# Patient Record
Sex: Female | Born: 1935 | Race: Black or African American | Hispanic: No | Smoking: Never smoker
Health system: Southern US, Community
[De-identification: ages and names within clinical notes are randomized; demographics above are authoritative.]

## PROBLEM LIST (undated history)

## (undated) DIAGNOSIS — M1990 Unspecified osteoarthritis, unspecified site: Secondary | ICD-10-CM

## (undated) DIAGNOSIS — M2540 Effusion, unspecified joint: Secondary | ICD-10-CM

## (undated) DIAGNOSIS — I639 Cerebral infarction, unspecified: Secondary | ICD-10-CM

## (undated) DIAGNOSIS — H409 Unspecified glaucoma: Secondary | ICD-10-CM

## (undated) DIAGNOSIS — K219 Gastro-esophageal reflux disease without esophagitis: Secondary | ICD-10-CM

## (undated) DIAGNOSIS — I2510 Atherosclerotic heart disease of native coronary artery without angina pectoris: Secondary | ICD-10-CM

## (undated) DIAGNOSIS — F419 Anxiety disorder, unspecified: Secondary | ICD-10-CM

## (undated) DIAGNOSIS — E119 Type 2 diabetes mellitus without complications: Secondary | ICD-10-CM

## (undated) DIAGNOSIS — I480 Paroxysmal atrial fibrillation: Secondary | ICD-10-CM

## (undated) DIAGNOSIS — M549 Dorsalgia, unspecified: Secondary | ICD-10-CM

## (undated) DIAGNOSIS — G459 Transient cerebral ischemic attack, unspecified: Secondary | ICD-10-CM

## (undated) DIAGNOSIS — M2550 Pain in unspecified joint: Secondary | ICD-10-CM

## (undated) DIAGNOSIS — R42 Dizziness and giddiness: Secondary | ICD-10-CM

## (undated) DIAGNOSIS — R011 Cardiac murmur, unspecified: Secondary | ICD-10-CM

## (undated) DIAGNOSIS — Z8719 Personal history of other diseases of the digestive system: Secondary | ICD-10-CM

## (undated) DIAGNOSIS — I872 Venous insufficiency (chronic) (peripheral): Secondary | ICD-10-CM

## (undated) DIAGNOSIS — I10 Essential (primary) hypertension: Secondary | ICD-10-CM

## (undated) DIAGNOSIS — Z8711 Personal history of peptic ulcer disease: Secondary | ICD-10-CM

## (undated) DIAGNOSIS — J42 Unspecified chronic bronchitis: Secondary | ICD-10-CM

## (undated) DIAGNOSIS — Z950 Presence of cardiac pacemaker: Secondary | ICD-10-CM

## (undated) DIAGNOSIS — Z8739 Personal history of other diseases of the musculoskeletal system and connective tissue: Secondary | ICD-10-CM

## (undated) DIAGNOSIS — F329 Major depressive disorder, single episode, unspecified: Secondary | ICD-10-CM

## (undated) DIAGNOSIS — I5032 Chronic diastolic (congestive) heart failure: Secondary | ICD-10-CM

## (undated) DIAGNOSIS — J189 Pneumonia, unspecified organism: Secondary | ICD-10-CM

## (undated) DIAGNOSIS — L439 Lichen planus, unspecified: Secondary | ICD-10-CM

## (undated) DIAGNOSIS — A159 Respiratory tuberculosis unspecified: Secondary | ICD-10-CM

## (undated) DIAGNOSIS — K5790 Diverticulosis of intestine, part unspecified, without perforation or abscess without bleeding: Secondary | ICD-10-CM

## (undated) DIAGNOSIS — I495 Sick sinus syndrome: Secondary | ICD-10-CM

## (undated) DIAGNOSIS — F32A Depression, unspecified: Secondary | ICD-10-CM

## (undated) DIAGNOSIS — M62838 Other muscle spasm: Secondary | ICD-10-CM

## (undated) DIAGNOSIS — G8929 Other chronic pain: Secondary | ICD-10-CM

## (undated) DIAGNOSIS — Z860100 Personal history of colon polyps, unspecified: Secondary | ICD-10-CM

## (undated) HISTORY — DX: Lichen planus, unspecified: L43.9

## (undated) HISTORY — DX: Unspecified chronic bronchitis: J42

## (undated) HISTORY — DX: Atherosclerotic heart disease of native coronary artery without angina pectoris: I25.10

## (undated) HISTORY — DX: Paroxysmal atrial fibrillation: I48.0

## (undated) HISTORY — DX: Venous insufficiency (chronic) (peripheral): I87.2

## (undated) HISTORY — PX: ABDOMINAL HYSTERECTOMY: SHX81

## (undated) HISTORY — PX: INSERT / REPLACE / REMOVE PACEMAKER: SUR710

## (undated) HISTORY — DX: Unspecified osteoarthritis, unspecified site: M19.90

## (undated) HISTORY — PX: CARDIAC CATHETERIZATION: SHX172

## (undated) HISTORY — PX: TONSILLECTOMY: SUR1361

## (undated) HISTORY — PX: ESOPHAGOGASTRODUODENOSCOPY: SHX1529

## (undated) HISTORY — PX: COLONOSCOPY: SHX174

## (undated) HISTORY — DX: Chronic diastolic (congestive) heart failure: I50.32

## (undated) HISTORY — DX: Sick sinus syndrome: I49.5

## (undated) HISTORY — PX: BACK SURGERY: SHX140

## (undated) HISTORY — PX: CATARACT EXTRACTION W/ INTRAOCULAR LENS  IMPLANT, BILATERAL: SHX1307

---

## 1969-01-28 HISTORY — PX: TUBAL LIGATION: SHX77

## 1988-09-28 DIAGNOSIS — I639 Cerebral infarction, unspecified: Secondary | ICD-10-CM

## 1988-09-28 HISTORY — DX: Cerebral infarction, unspecified: I63.9

## 1997-03-10 ENCOUNTER — Ambulatory Visit (HOSPITAL_COMMUNITY): Admission: RE | Admit: 1997-03-10 | Discharge: 1997-03-10 | Payer: Self-pay | Admitting: *Deleted

## 1997-04-28 ENCOUNTER — Ambulatory Visit (HOSPITAL_COMMUNITY): Admission: RE | Admit: 1997-04-28 | Discharge: 1997-04-28 | Payer: Self-pay | Admitting: *Deleted

## 1997-07-12 ENCOUNTER — Ambulatory Visit (HOSPITAL_COMMUNITY): Admission: RE | Admit: 1997-07-12 | Discharge: 1997-07-12 | Payer: Self-pay | Admitting: *Deleted

## 1997-07-18 ENCOUNTER — Emergency Department (HOSPITAL_COMMUNITY): Admission: EM | Admit: 1997-07-18 | Discharge: 1997-07-18 | Payer: Self-pay | Admitting: Emergency Medicine

## 1997-09-26 ENCOUNTER — Emergency Department (HOSPITAL_COMMUNITY): Admission: EM | Admit: 1997-09-26 | Discharge: 1997-09-26 | Payer: Self-pay | Admitting: Emergency Medicine

## 1997-10-13 ENCOUNTER — Emergency Department (HOSPITAL_COMMUNITY): Admission: EM | Admit: 1997-10-13 | Discharge: 1997-10-13 | Payer: Self-pay

## 1998-01-11 ENCOUNTER — Ambulatory Visit (HOSPITAL_COMMUNITY): Admission: RE | Admit: 1998-01-11 | Discharge: 1998-01-11 | Payer: Self-pay | Admitting: *Deleted

## 1998-03-03 ENCOUNTER — Ambulatory Visit (HOSPITAL_COMMUNITY): Admission: RE | Admit: 1998-03-03 | Discharge: 1998-03-03 | Payer: Self-pay | Admitting: *Deleted

## 1998-03-23 ENCOUNTER — Ambulatory Visit (HOSPITAL_COMMUNITY): Admission: RE | Admit: 1998-03-23 | Discharge: 1998-03-23 | Payer: Self-pay | Admitting: *Deleted

## 1998-04-07 ENCOUNTER — Ambulatory Visit (HOSPITAL_COMMUNITY): Admission: RE | Admit: 1998-04-07 | Discharge: 1998-04-07 | Payer: Self-pay | Admitting: *Deleted

## 1998-04-26 ENCOUNTER — Other Ambulatory Visit: Admission: RE | Admit: 1998-04-26 | Discharge: 1998-04-26 | Payer: Self-pay | Admitting: Obstetrics and Gynecology

## 1998-05-11 ENCOUNTER — Encounter: Payer: Self-pay | Admitting: Obstetrics and Gynecology

## 1998-05-11 ENCOUNTER — Ambulatory Visit (HOSPITAL_COMMUNITY): Admission: RE | Admit: 1998-05-11 | Discharge: 1998-05-11 | Payer: Self-pay | Admitting: Obstetrics and Gynecology

## 1998-11-09 ENCOUNTER — Encounter: Admission: RE | Admit: 1998-11-09 | Discharge: 1998-11-09 | Payer: Self-pay | Admitting: Nephrology

## 1998-11-16 ENCOUNTER — Encounter: Payer: Self-pay | Admitting: Nephrology

## 1998-11-16 ENCOUNTER — Encounter: Admission: RE | Admit: 1998-11-16 | Discharge: 1998-11-16 | Payer: Self-pay | Admitting: Nephrology

## 1999-02-23 ENCOUNTER — Encounter: Payer: Self-pay | Admitting: Family Medicine

## 1999-02-23 ENCOUNTER — Ambulatory Visit (HOSPITAL_COMMUNITY): Admission: RE | Admit: 1999-02-23 | Discharge: 1999-02-23 | Payer: Self-pay | Admitting: Family Medicine

## 1999-03-16 ENCOUNTER — Encounter: Payer: Self-pay | Admitting: Family Medicine

## 1999-03-16 ENCOUNTER — Ambulatory Visit (HOSPITAL_COMMUNITY): Admission: RE | Admit: 1999-03-16 | Discharge: 1999-03-16 | Payer: Self-pay | Admitting: Family Medicine

## 1999-03-30 ENCOUNTER — Encounter: Payer: Self-pay | Admitting: Emergency Medicine

## 1999-03-30 ENCOUNTER — Inpatient Hospital Stay (HOSPITAL_COMMUNITY): Admission: EM | Admit: 1999-03-30 | Discharge: 1999-04-04 | Payer: Self-pay | Admitting: Interventional Cardiology

## 1999-04-02 ENCOUNTER — Encounter: Payer: Self-pay | Admitting: Interventional Cardiology

## 1999-04-04 ENCOUNTER — Encounter: Payer: Self-pay | Admitting: Interventional Cardiology

## 1999-05-17 ENCOUNTER — Encounter: Admission: RE | Admit: 1999-05-17 | Discharge: 1999-05-17 | Payer: Self-pay | Admitting: *Deleted

## 1999-10-03 ENCOUNTER — Ambulatory Visit (HOSPITAL_COMMUNITY): Admission: RE | Admit: 1999-10-03 | Discharge: 1999-10-03 | Payer: Self-pay | Admitting: Family Medicine

## 1999-10-03 ENCOUNTER — Encounter: Payer: Self-pay | Admitting: Family Medicine

## 1999-11-13 ENCOUNTER — Other Ambulatory Visit: Admission: RE | Admit: 1999-11-13 | Discharge: 1999-11-13 | Payer: Self-pay | Admitting: Obstetrics and Gynecology

## 1999-11-15 ENCOUNTER — Encounter: Payer: Self-pay | Admitting: Obstetrics and Gynecology

## 1999-11-15 ENCOUNTER — Ambulatory Visit (HOSPITAL_COMMUNITY): Admission: RE | Admit: 1999-11-15 | Discharge: 1999-11-15 | Payer: Self-pay | Admitting: Family Medicine

## 2000-05-02 ENCOUNTER — Encounter: Payer: Self-pay | Admitting: Family Medicine

## 2000-05-02 ENCOUNTER — Ambulatory Visit (HOSPITAL_COMMUNITY): Admission: RE | Admit: 2000-05-02 | Discharge: 2000-05-02 | Payer: Self-pay | Admitting: Family Medicine

## 2000-05-15 ENCOUNTER — Encounter: Payer: Self-pay | Admitting: Family Medicine

## 2000-05-15 ENCOUNTER — Ambulatory Visit (HOSPITAL_COMMUNITY): Admission: RE | Admit: 2000-05-15 | Discharge: 2000-05-15 | Payer: Self-pay | Admitting: Family Medicine

## 2000-08-27 ENCOUNTER — Encounter: Payer: Self-pay | Admitting: Family Medicine

## 2000-08-27 ENCOUNTER — Ambulatory Visit (HOSPITAL_COMMUNITY): Admission: RE | Admit: 2000-08-27 | Discharge: 2000-08-27 | Payer: Self-pay | Admitting: Family Medicine

## 2001-10-02 ENCOUNTER — Ambulatory Visit (HOSPITAL_COMMUNITY): Admission: RE | Admit: 2001-10-02 | Discharge: 2001-10-02 | Payer: Self-pay | Admitting: *Deleted

## 2001-10-02 ENCOUNTER — Encounter (INDEPENDENT_AMBULATORY_CARE_PROVIDER_SITE_OTHER): Payer: Self-pay | Admitting: Specialist

## 2002-01-19 ENCOUNTER — Encounter: Payer: Self-pay | Admitting: Emergency Medicine

## 2002-01-19 ENCOUNTER — Emergency Department (HOSPITAL_COMMUNITY): Admission: EM | Admit: 2002-01-19 | Discharge: 2002-01-19 | Payer: Self-pay | Admitting: Emergency Medicine

## 2002-03-22 ENCOUNTER — Ambulatory Visit (HOSPITAL_COMMUNITY): Admission: RE | Admit: 2002-03-22 | Discharge: 2002-03-22 | Payer: Self-pay | Admitting: Family Medicine

## 2002-03-22 ENCOUNTER — Encounter: Payer: Self-pay | Admitting: Family Medicine

## 2002-04-08 ENCOUNTER — Other Ambulatory Visit: Admission: RE | Admit: 2002-04-08 | Discharge: 2002-04-08 | Payer: Self-pay | Admitting: Obstetrics and Gynecology

## 2002-04-14 ENCOUNTER — Encounter: Payer: Self-pay | Admitting: Obstetrics and Gynecology

## 2002-04-14 ENCOUNTER — Ambulatory Visit (HOSPITAL_COMMUNITY): Admission: RE | Admit: 2002-04-14 | Discharge: 2002-04-14 | Payer: Self-pay | Admitting: Obstetrics and Gynecology

## 2002-11-09 ENCOUNTER — Encounter: Payer: Self-pay | Admitting: Family Medicine

## 2002-11-09 ENCOUNTER — Ambulatory Visit (HOSPITAL_COMMUNITY): Admission: RE | Admit: 2002-11-09 | Discharge: 2002-11-09 | Payer: Self-pay | Admitting: Family Medicine

## 2003-06-01 ENCOUNTER — Ambulatory Visit (HOSPITAL_COMMUNITY): Admission: RE | Admit: 2003-06-01 | Discharge: 2003-06-01 | Payer: Self-pay | Admitting: Specialist

## 2003-06-17 ENCOUNTER — Ambulatory Visit (HOSPITAL_COMMUNITY): Admission: RE | Admit: 2003-06-17 | Discharge: 2003-06-17 | Payer: Self-pay | Admitting: Family Medicine

## 2003-06-23 ENCOUNTER — Inpatient Hospital Stay (HOSPITAL_BASED_OUTPATIENT_CLINIC_OR_DEPARTMENT_OTHER): Admission: RE | Admit: 2003-06-23 | Discharge: 2003-06-23 | Payer: Self-pay | Admitting: Interventional Cardiology

## 2003-09-13 ENCOUNTER — Ambulatory Visit (HOSPITAL_COMMUNITY): Admission: RE | Admit: 2003-09-13 | Discharge: 2003-09-13 | Payer: Self-pay | Admitting: Family Medicine

## 2003-09-14 ENCOUNTER — Ambulatory Visit (HOSPITAL_COMMUNITY): Admission: RE | Admit: 2003-09-14 | Discharge: 2003-09-14 | Payer: Self-pay | Admitting: Specialist

## 2006-05-21 ENCOUNTER — Ambulatory Visit (HOSPITAL_COMMUNITY): Admission: RE | Admit: 2006-05-21 | Discharge: 2006-05-21 | Payer: Self-pay | Admitting: Obstetrics and Gynecology

## 2006-07-23 ENCOUNTER — Ambulatory Visit (HOSPITAL_COMMUNITY): Admission: RE | Admit: 2006-07-23 | Discharge: 2006-07-23 | Payer: Self-pay | Admitting: Cardiology

## 2006-08-29 HISTORY — PX: LUMBAR DISC SURGERY: SHX700

## 2006-10-01 ENCOUNTER — Emergency Department (HOSPITAL_COMMUNITY): Admission: EM | Admit: 2006-10-01 | Discharge: 2006-10-01 | Payer: Self-pay | Admitting: Emergency Medicine

## 2006-10-01 ENCOUNTER — Ambulatory Visit: Payer: Self-pay | Admitting: Surgery

## 2007-06-11 ENCOUNTER — Encounter: Admission: RE | Admit: 2007-06-11 | Discharge: 2007-06-11 | Payer: Self-pay | Admitting: Orthopaedic Surgery

## 2007-07-02 ENCOUNTER — Encounter: Admission: RE | Admit: 2007-07-02 | Discharge: 2007-07-02 | Payer: Self-pay | Admitting: Orthopaedic Surgery

## 2007-08-21 ENCOUNTER — Ambulatory Visit (HOSPITAL_COMMUNITY): Admission: RE | Admit: 2007-08-21 | Discharge: 2007-08-21 | Payer: Self-pay | Admitting: Family Medicine

## 2007-09-03 ENCOUNTER — Ambulatory Visit: Payer: Self-pay | Admitting: Vascular Surgery

## 2007-09-03 ENCOUNTER — Encounter (INDEPENDENT_AMBULATORY_CARE_PROVIDER_SITE_OTHER): Payer: Self-pay | Admitting: Family Medicine

## 2007-09-03 ENCOUNTER — Ambulatory Visit: Admission: RE | Admit: 2007-09-03 | Discharge: 2007-09-03 | Payer: Self-pay | Admitting: Family Medicine

## 2008-07-08 ENCOUNTER — Encounter: Admission: RE | Admit: 2008-07-08 | Discharge: 2008-07-08 | Payer: Self-pay | Admitting: Family Medicine

## 2008-11-18 ENCOUNTER — Emergency Department (HOSPITAL_COMMUNITY): Admission: EM | Admit: 2008-11-18 | Discharge: 2008-11-18 | Payer: Self-pay | Admitting: Emergency Medicine

## 2010-04-10 ENCOUNTER — Other Ambulatory Visit (HOSPITAL_COMMUNITY): Payer: Self-pay | Admitting: Family Medicine

## 2010-04-10 DIAGNOSIS — Z1231 Encounter for screening mammogram for malignant neoplasm of breast: Secondary | ICD-10-CM

## 2010-04-12 ENCOUNTER — Ambulatory Visit (HOSPITAL_COMMUNITY): Payer: Medicare Other

## 2010-04-19 ENCOUNTER — Ambulatory Visit (HOSPITAL_COMMUNITY)
Admission: RE | Admit: 2010-04-19 | Discharge: 2010-04-19 | Disposition: A | Discharge status: Home / Self Care | Payer: Medicare Other | Source: Ambulatory Visit | Attending: Family Medicine | Admitting: Family Medicine

## 2010-04-19 DIAGNOSIS — Z1231 Encounter for screening mammogram for malignant neoplasm of breast: Secondary | ICD-10-CM | POA: Insufficient documentation

## 2010-05-03 LAB — BASIC METABOLIC PANEL
BUN: 12 mg/dL (ref 6–23)
Chloride: 97 mEq/L (ref 96–112)
Glucose, Bld: 125 mg/dL — ABNORMAL HIGH (ref 70–99)
Potassium: 3.1 mEq/L — ABNORMAL LOW (ref 3.5–5.1)

## 2010-05-03 LAB — DIFFERENTIAL
Basophils Absolute: 0 10*3/uL (ref 0.0–0.1)
Basophils Relative: 0 % (ref 0–1)
Eosinophils Relative: 2 % (ref 0–5)
Lymphocytes Relative: 25 % (ref 12–46)
Monocytes Absolute: 0.7 10*3/uL (ref 0.1–1.0)
Monocytes Relative: 10 % (ref 3–12)

## 2010-05-03 LAB — CBC
HCT: 40.3 % (ref 36.0–46.0)
Hemoglobin: 13.2 g/dL (ref 12.0–15.0)
MCHC: 32.8 g/dL (ref 30.0–36.0)
RDW: 14 % (ref 11.5–15.5)

## 2010-06-12 NOTE — Op Note (Signed)
NAME:  [name], [name] NO.:  [id number]   MEDICAL RECORD NO.:  [id number]          PATIENT TYPE:  OIB   LOCATION:  2853                         FACILITY:  [facility]   PHYSICIAN:  Francisca December, M.D.  DATE OF BIRTH:  January 19, 1933   DATE OF PROCEDURE:  07/23/2006  DATE OF DISCHARGE:                               OPERATIVE REPORT   PROCEDURE:  Permanent pacemaker battery change.   PROCEDURES PERFORMED:  1. Explant old pacing generator.  2. Implant new dual-chamber permanent transvenous pacemaker.   INDICATION:  Elective replacement interval on a Medtronic KDR 601  implant April 03, 1999. 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   PROCEDURAL NOTE:  The patient was brought to the cardiac catheterization  laboratory in the fasting state.  The left prepectoral region was  prepped and draped in the usual sterile fashion.  Local anesthesia was  obtained with infiltration of 1% lidocaine with epinephrine throughout  the old pacemaker insertion site.  There was a moderate size keloid  present.  A 6-7 cm incision was then made over the previous incision  site through the keloid and this was carried down by sharp and blunt  dissection to the pacemaker capsule.  The pacemaker capsule was incised  and the device delivered without extensive difficulty.  The leads were  then detached from the pacing generator and tested for adequate pacing  parameters.  This is reported below.  The pocket was then copiously  irrigated using 1% kanamycin solution.  The leads were attached to the  new pacing generator carefully identifying each by its serial number and  placing each into the appropriate receptacle.  Each lead was tightened  into place and tested for security.   The pacemaker generator was then replaced back into the previous  pacemaker capsule.  The wound was inspected for bleeding; and none was  found.  The wound was then closed  using 2-0 Vicryl in a running fashion  for the subcutaneous layer.  The skin was approximated using 4-0 Vicryl  in a running subcuticular fashion.  Steri-Strips and a sterile dressing  were applied; and the patient was transported to the recovery area in  stable condition.   EQUIPMENT DATA:  The explanted device is a Medtronic model number KDR  601, serial number [id number].  The new implanted pacing generator is  a Medtronic Adapta model ADDR-L1, serial number [id number].   PACING DATA:  The atrial lead detected a 5.5 mV P-wave.  The pacing  threshold was 0.4 volts at 0.5 milliseconds pulse width.  The impedance  was 498 ohms resulting in a current at capture threshold of 1.0 MA.  The  ventricular lead detected an 11.3 mV R wave.  The pacing threshold was  2.3 volts at 0.5 milliseconds pulse width.  The impedance was 604 ohms  resulting in a current at capture threshold of 4.5 MA.   The device will be programmed to AAI/MVP to allow for as much intrinsic  conduction as possible and minimal, right ventricular pacing.  The  larger battery device was used to improve  device longevity.      Francisca December, M.D.  Electronically Signed     JHE/MEDQ  D:  07/23/2006  T:  07/23/2006  Job:  [id number]

## 2010-06-15 NOTE — Op Note (Signed)
[place]. [facility]  Patient:    [name], COREA                    MRN: [id number] Proc. Date: 04/03/99 Adm. Date:  [id number] Disc. Date: [id number] Attending:  Lyn Records. Iii CC:         Darci Needle, M.D.             Meredith Staggers, M.D.             Cardiac Catheterization Laboratory                           Operative Report  PROCEDURES PERFORMED: 1. Insertion of dual chamber permanent transvenous pacemaker. 2. Left subclavian venogram.  SURGEON:  Francisca December, M.D.  ANESTHESIA:  INDICATIONS:  The patient is a 77 year old woman, recently admitted with atrial  fibrillation and rapid ventricular response.  She had a spontaneous conversion o sinus rhythm after IV Cardizem, heparin, and beta blocker.  However, her rate has been quite slow in the 50 beat per minute range.  An attempt to treat her paroxysmal atrial fibrillation with sotalol resulted in an even slower sinus bradycardia.  Therefore, she is having a permanent transvenous pacemaker placed for tachybrady syndrome, and to allow for appropriate antiarrhythmic therapy.  DESCRIPTION OF PROCEDURE:  The patient was brought to the electrophysiology laboratory in a postabsorptive state.  The left prepectoral region was prepped nd draped in the usual sterile fashion.  Local anesthesia was obtained with infiltration of 1% lidocaine, approximately 50 cc throughout the left prepectoral region.  A left subclavian venogram was performed using the peripheral injection of 20 cc of Omnipaque.  A digital venogram was obtained.  The left subclavian vein was found to be widely patent and coursing in a normal fashion over the anterior surface of the first rib, and beneath the middle third of the clavicle.  A 6-7 cm incision was made two fingerbreadths below the clavicle.  It was carried down by sharp dissection with electrocautery to the prepectoral fascia. ________ was lifted and a  pocket formed inferiorly and medially using blunt dissection. The pocket was packed with 1% kanamycin soaked gauze.  Left subclavian vein puncture was then performed using an 18-gauge thin wall needle.  Two separate punctures ere performed, through which was passed a 0.038 inch tight J-guidewire.  A figure-of-eight hemostasis suture was placed.  The initial lead was passed through a 9-French tear-away sheath and dilator.  Then, the lead was placed appropriately in the right atrium and the sheath was removed.  Using standard technique and fluoroscopic landmarks, the lead was manipulated into the right ventricular apex.  Excellent pacing parameters were obtained which will be mentioned below.  The lead was then sutured in place using three separate 0 ilk ligatures.  A second 7-French tear-away sheath and dilator was advanced over the remaining wire.  Through this was passed the atrial lead.  Again, positioned in the right atrium.  The sheath was torn away.  Using again standard fluoroscopic landmarks and standard technique, the lead was manipulated into the right atrial appendage.  Excellent pacing parameters were obtained.  The lead with an active  fixation device was then advanced into place.  Excellent pacing parameters were  obtained.  The lead was then sutured into place, again using three separate 0 silk ligatures.  The kanamycin soaked gauze was removed from the pocket.  The pocket was copiously irrigated with 1% kanamycin solution.  The pocket was inspected for bleeding and none was found.  The pacing wires were attached to the pacing generator with identification of the atrial and ventricular lead by serial number.  The wires ere _________ the pacesetter.  Generator and the device was placed within the pocket. The pocket was closed using 2-0 Dexon in a running subcutaneous fashion, two layers.  The skin was approximated using 5-0 Dexon in a running  subcuticular fashion.  Steri-Strips and a sterile dressing were applied.  The patient was transported to the ward in stable condition, in an A-paced and V-paced mode.  EQUIPMENT DATA:  The pacing generator was a Medtronics Kappa, model J4243573, serial  [id number].  The atrial lead was a Medtronics model U9629235, serial [id number].  The ventricular lead is a Medtronics model M5895571, serial [id number].  PACING DATA:  The atrial lead detected a 4.5 mV P-wave.  The pacing threshold was 0.9 V at 0.5 msec.  The current was 1.6 mA and the resistance was 598 ohms. The ventricular lead detected a 16.1 mV R-wave.  The captured threshold was 1 V at  0.5 msec and 1.9 mA current.  The resistance was 621 ohms.  It should be noted hat each lead was tested at 10 V for diaphragmatic pacing and none was found. DD:  04/03/99 TD:  04/04/99 Job: [id number]

## 2010-06-15 NOTE — Consult Note (Signed)
. [facility]  Patient:    [name], [name]                    MRN: [id number] Proc. Date: 04/02/99 Adm. Date:  [id number] Attending:  Lyn Records. Iii CC:         [name], M.D.             Celso Sickle, M.D.                          Consultation Report  REASON FOR CONSULTATION:  Pre-pacemaker.  HISTORY OF PRESENT ILLNESS:  [name] is a 77 year old woman who was  admitted on March 30, 1999 after having awakened at 3 a.m. complaining of chest  pressure with rapid heart action.  She presented to the emergency room where an ECG revealed the presence of atrial fibrillation with rapid ventricular response. he was treated with IV Cardizem and intravenous heparin and converted to sinus rhythm within 24 hours.  Her resting heart rate at the time was approximately 55 beats per minute.  An attempt to initiate Betapace 80 mg p.o. q.12h was for treatment of er atrial fibrillation was unsuccessful.  She was therefore referred for permanent  pacemaker insertion as definitive treatment for tachy/brady syndrome.  She has ot yet undergone evaluation of her chest tightness/rule out coronary ischemia.  PAST MEDICAL HISTORY: 1.  Hypertension. 2.  Peptic ulcer disease. 3.  TIA, ? old CVA on MRI in 1997 found during work-up for dizziness.  PAST SURGICAL HISTORY:  Status post TAH, BSO secondary to fibroids, remote tonsillectomy.  OUTPATIENT MEDICATIONS: 1.  Avapro 300 mg p.o. q.d. 2.  Atenolol 25 mg p.o. every other day. 3.  Zaroxolyn 2.5 mg three times weekly. 4.  Estratest 1 p.o. q.d. 5.  Aspirin 325 mg p.o. q.d.  ALLERGIES:  No known drug allergies.  FAMILY HISTORY:  Father had CAD and required a pacemaker at some point.  Mother has history of hypertension and brain tumor, and has diabetes in a sister and an aunt.  SOCIAL HISTORY:  She does not use tobacco or alcohol products.  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  REVIEW OF SYSTEMS:  Noncontributory.  PHYSICAL EXAMINATION:  VITAL SIGNS:  Blood pressure 142/68, Marsh 48 and regular, respiratory rate 16,  temperature 97.3 degrees.  Room air oxygen saturation 98 per cent.  GENERAL:  Well-appearing 77 year old woman in no acute distress.  HEENT:  Unremarkable.  PERRLA.  EOMI.  Sclerae nonicteric.  Oral mucosa pink and moist.  NECK:  Supple without thyromegaly or masses.  Carotid upstrokes normal.  No bruits, no jugular venous distention.  CHEST:  Clear with adequate excursion bilaterally.  Normal vesicular breath sounds throughout.  The precordium is quiet.  CARDIAC:  Normal S1 and S2 heard.  Soft ejection systolic murmur present along he left sternal border.  PMI not palpable.  ABDOMEN:  Soft, flat, and nontender.  No hepatosplenomegaly or midline pulsatile masses.  Bowel sounds present in all quadrants.  EXTREMITIES:  Full range of motion, no edema, intact distal pulses.  Femoral pulses, dorsalis pedis, and popliteal pulses all within normal limits.  NEUROLOGIC:  Cranial nerves 2-12 intact.  Motor and sensory grossly intact. Gait is not tested.  SKIN:  Warm and dry an clear.  LABORATORY DATA:  Electrocardiogram following resolution of her atrial fibrillation showed sinus bradycardia, left atrial abnormality, nonspecific  T wave abnormality.  There were clear ST segment depressions during her rapid atrial fibrillation.  IMPRESSION: 1.  Tachy/brady syndrome. 2.  Rule out coronary artery disease. 3.  History of hypertension. 4.  Peptic ulcer disease, rule out. 5.  Myocardial infarction has been ruled out. 6.  History of transient ischemic attack/possible cerebrovascular accident; wonder     if this may have been related to atrial fibrillation in the past.  PLAN:  The patient is undergo and has accepted permanent pacemaker insertion. his will be a dual chamber device.  This will allow for appropriate  antiarrhythmic therapy.  The goals and risks of this procedure were explained at length and the patient wishes to proceed.  Recommend also proceeding with evaluation for coronary heart disease. DD:  04/02/99 TD:  04/02/99 Job: [id number]

## 2010-06-15 NOTE — Discharge Summary (Signed)
[place]. [facility]  Patient:    [name], [name]                    MRN: [id number] Adm. Date:  [id number] Disc. Date: [id number] Attending:  Lyn Records. Iii Dictator:   Anselm Lis, N.P. CC:         Arvella Merles, M.D.             Francisca December, M.D.                           Discharge Summary  PRIMARY CARE PHYSICIAN:  Arvella Merles, M.D.  PROCEDURES: 1. (April 03, 1999):  Placement of permanent transvenous pacemaker Medtronics    DDR) for tachybrady syndrome Francisca December, M.D.). 2. TSH check:  Slightly elevated 5.946 (reference range 0.35 to 5.1). 3. (On April 02, 1999):  Adenosine Cardiolite which was negative for evidence    of myocardial ischemia. EF of 52%.  DISCHARGE DIAGNOSES: 1. Tachybrady syndrome with junctional escape rhythm. The patient admitted    with dyspnea, chest pain, atrial fibrillation with rapid ventricular    response which started abruptly approximately 3 a.m. morning of admission.    She was initially admitted through [facility] and then    transferred to [facility] on March 30, 1999. She spontaneously reverted    to NSR/sinus bradycardia after her rate had been controlled with IV    Cardizem. She was initially on intravenous heparin which was discontinued.    She was without complaint of chest pain with her heart in controlled rate.    She has had a prior cardiac workup with cardiac catheterization which was    negative for significant coronary artery disease back in 1998. She did rule    out for myocardial infarction by negative serial enzymes. She underwent    adenosine Cardiolite which was negative for evidence of myocardial    ischemia. EF of 52%. She was initiated on Betapace on March 30, 1999, with    maintenance of sinus rhythm but episodes of junctional escape rhythm 40 to    60 beats per minute. The patient was counseled to undergo and accepted    plans for a permanent transvenous  pacemaker in a setting of tachybrady    syndrome. On April 03, 1999, she was taken to the EP lab by Francisca December,    M.D. where subsequently she had placed a Medtronics generator serial    [id number]. She had an atrial pacemaker lead Medtronics serial    [id number]. Ventricular lead Medtronics serial [id number]. Next day, the    patient was feeling well with some slight incisional pain and slight    bleeding on steri-strips. No evidence of hematoma. Her chest x-ray revealed    pacemaker leads in good position and no evidence of pneumothorax. Telemetry    monitor revealed good pacemaker capture. She was discharged home in stable    condition. 2. Hypertension. The patient continued on her home dose of Avapro. Her    Atenolol was discontinued in preference to initiation of Betapace. The    blood pressure ran approximately 140s to 180s during course of admission.    It was elected not to initiate new blood pressure medication with plans for    patient to follow a strict low-salt, low-fat diet (which she does to some    extent now) with following of home  blood pressure measurements at least    three times per week. She will bring these lists of measurements in with    her to office visit with further adjustment of blood pressure medications    thereafter. She has had past lower extremity swelling with use of Norvasc. 3. Slight hypothyroidism. TSH slightly elevated a 5.946 (reference range 0.35    to 5.1). Will leave further management of this to primary care physician,    W. Holley Bouche, M.D.  DISPOSITION:  The patient was discharged home in stable and improved condition.  DISCHARGE MEDICATIONS: 1. (New) Betapace 80 mg p.o. b.i.d. 2. Avapro 300 mg once daily. 3. Estratest one tab once daily. 4. Enteric-coated aspirin 325 mg once daily. She will resume that in three    days. 5. (New) Tylenol No. 3 one to two tabs q.6h. p.r.n. pain.  DIET:  Will follow diet.  WOUND CARE:   Keep area dry for two days and then may bathe. Keep steri-strips intact.  SPECIAL INSTRUCTIONS:  Progressive use of left arm, outlined in detail with patient.  FOLLOW-UP:  Celso Sickle, M.D. Thursday, April 5 at 4:00 p.m. and Francisca December, M.D. for wound check Thursday, March 15 at 12:30 p.m. She will be enrolled in our pacemaker clinic with Mindy for management of her device thereafter.  LABORATORY DATA:  Chest x-ray from April 02, 1999 revealed mild cardiomegaly. No active disease.  Electrocardiogram following resolution of her atrial fibrillation revealed sinus brady, left atrial abnormality, nonspecific T wave abnormalities. There were clear ST segment depressions during her rapid atrial fibrillation.  CBC on March 6 revealed hemoglobin of 13.9 hematocrit of 42.6, platelets of 230. TSH elevated at 5.946 (reference range of 0.35 to 5.1). Sodium 139, potassium 3.9, chloride of 103, CO2 of 29, glucose 120, BUN 9, creatinine 0.9, and calcium 9. Admission coags were within normal range. Serial cardiac enzymes were negative.  Adenosine Cardiolite from April 02, 1999, revealed no evidence of myocardial ischemia, probable inferior wall diaphragmatic attenuation. Calculated left ventricular ejection fraction of 52%.  PAST MEDICAL HISTORY: 1. Hypertension. 2. Peptic ulcer disease. 3. TIA:  Question old CVA on MRI in 1997 found during workup for dizziness.  PAST SURGICAL HISTORY:  Status post TAH, BSO secondary to fibroids. Remote tonsillectomy. DD:  04/04/99 TD:  04/05/99 Job: [id number]

## 2010-06-15 NOTE — Cardiovascular Report (Signed)
NAME:  [name], [name] NO.:  [id number]   MEDICAL RECORD NO.:  [id number]                   PATIENT TYPE:  OIB   LOCATION:  6501                                 FACILITY:  [facility]   PHYSICIAN:  Lesleigh Noe, M.D.            DATE OF BIRTH:  1932-06-01   DATE OF PROCEDURE:  06/23/2003  DATE OF DISCHARGE:  06/23/2003                              CARDIAC CATHETERIZATION   REFERRING PHYSICIAN:  Holley Bouche, M.D.   PROCEDURES PERFORMED:  1. Left heart catheterization.  2. Selective coronary angiography.  3. Left ventriculography.   CARDIOLOGIST:  Lyn Records, M.D.   INDICATIONS FOR PROCEDURE:  Recurrent chest discomfort in this lady with a  Cardiolite study in 2004 that showed decreased anterior wall uptake.  She  has a significant history of hypertension.  This study is being done to rule  out significant coronary disease considering her ongoing symptoms.   DESCRIPTION OF PROCEDURE:  A 4 French sheath was placed in the right femoral  artery after Xylocaine local anesthesia infiltration.  Four [place] number 4  A-2 multipurpose catheter was used for hemodynamic recordings, left  ventriculography by hand injection and right coronary angiography.  Number  four 4 French left Judkins catheter was used for left coronary angiography.   The patient told the procedure without complications.   RESULTS:   I HEMODYNAMIC DATA:  A)  Aortic pressure 144/69.  B)  Left ventricular pressure 144/9 mmHg.   II LEFT VENTRICULOGRAPHY:  The left ventricle demonstrates diminished LV  apical size with a somewhat spade-shape overall LV contour in ____________  projections suggesting the possibility of apical hypertrophy.  EF is 65%.  No MR.   III CORONARY ANGIOGRAPHY:  A)  Left Main Coronary:  Normal.   B)  Left Anterior Descending Coronary:  This is a large vessel that is  transapical in its distribution.  Two large diagonal branches arise  from  it.  Minimal  irregularities are noted at the mid LAD.  No significant  obstruction is seen.   C)  Circumflex Artery:  The circumflex coronary artery is large.  Three  obtuse marginal branches arise from it.  No significant obstruction is  noted.  Minimal luminal irregularities are noted proximally.   D)  Right Coronary:  The right coronary is relatively small in distribution.  PDA and two small left ventricular branches arise from it.  In the distal  vessel there is an eccentric 25% narrowing.   CONCLUSION:  1. No significant obstructive coronary disease is noted.  2. There is 25% distal obstruction at the right coronary.  3. Minimal luminal irregularities are noted in the mid left anterior     descending.  4. Overall normal left ventricular function.  There is some spade-shaped     quality to the left ventricular cavity suggesting the possibility of     apical hypertrophy.  No mitral regurgitation is noted.  PLAN:  1. Continued medical therapy.  2. Aggressive control of blood pressure.  3. Consider __________ inotropic agents to treat the possibility of     diastolic dysfunction due to hypertrophic cardiomyopathy.                                               Lesleigh Noe, M.D.    HWS/MEDQ  D:  06/23/2003  T:  06/24/2003  Job:  [id number]

## 2010-06-15 NOTE — H&P (Signed)
[place]. [facility]  Patient:    [name], [name]                    MRN: [id number] Adm. Date:  [id number] Attending:  Lyn Records. Iii CC:         Arvella Merles, M.D.             Meredith Staggers, M.D.                         History and Physical  REASON FOR ADMISSION:  Atrial fibrillation and congestive heart failure.  SUBJECTIVE:  The patient is 79 and has a history of hypertension.  She is admitted with sudden onset of dyspnea, chest tightness, and palpitations.  She came to [facility] Emergency Room where she was noted to be in atrial fibrillation with a rapid ventricular response.  She was given IV Cardizem with some resolution in he chest discomfort.  IV nitroglycerin was also started.  Over the past two or three months, she has noticed some chest tightness during emotional stress but not with physical activity.  She has had a previous negative cardiac workup for coronary  artery disease in 1997 or 1998, although I cannot find these reports at the time of this dictation.  I performed the heart catheterization.  ALLERGIES: None.  MEDICATIONS: 1. Avapro 200 mg per day. 2. Atenolol 50 mg 1/2 tablet every other day. 3. Zaroxolyn 2.5 mg 3 times per week. 4. Estratab 1 per day. 5. Aspirin 1 per day.  PAST MEDICAL HISTORY: 1. Hypertension. 2. Peptic ulcer disease. 3. Transient ischemic attack.  FAMILY HISTORY:  Mother had hypertension, died of a brain tumor.  Her father had heart disease.  She has a sister with diabetes.  HABITS:  She does not smoke or drink.  REVIEW OF SYSTEMS:  No lower extremity edema, no melena, no difficulty swallowing, no recent neurological symptoms.  Skin problems have not been present.  OBJECTIVE PHYSICAL EXAMINATION:  VITAL SIGNS:  Blood pressure 118/82, heart rate 100 and irregularly irregular. The patient is afebrile.  SKIN:  Clear.  HEENT:  Unremarkable.  Extraocular movements  full.  NECK:  No JVD, thyromegaly, or carotid bruits.  LUNGS:  Basilar rales.  CARDIAC:  Irregular rhythm, 1/6 systolic murmur.  No diastolic murmurs.  ABDOMEN:  Soft.  Liver and spleen not palpable.  Bowel sounds are normal.  EXTREMITIES:  No edema.  Femoral and posterior tibial pulses are 2+ and symmetric.  NEUROLOGIC:  Unremarkable.  LABORATORY DATA: EKG reveals atrial fibrillation with rapid ventricular response with nonspecific ST-T abnormality.  Chest x-ray reveals cardiomegaly with interstitial edema.  Potassium 2.0, creatinine 0.9.  Hemoglobin 14.9, white blood cell count 8.5, platelet count 259,000.  ASSESSMENT: 1. New onset atrial fibrillation with congestive heart failure and anginal quality    chest discomfort. 2. History of hypertension. 3. History of transient ischemic attack. 4. Hypokalemia.  PLAN: 1. IV Cardizem to control rate. 2. IV heparin. 3. Initiate antiarrhythmic therapy once heart rate is controlled.  Will probably    use ______ after potassium is repleted. 4. Replete potassium. 5. Rule out myocardial infarction with enzymes and serial EKGs. 6. An ischemic workup may become necessary after the patient has converted to    normal sinus rhythm if she continues to have chest discomfort. DD:  03/30/99 TD:  03/30/99 Job: [id number]

## 2010-09-13 ENCOUNTER — Other Ambulatory Visit: Payer: Self-pay | Admitting: Physician Assistant

## 2010-09-13 ENCOUNTER — Ambulatory Visit
Admission: RE | Admit: 2010-09-13 | Discharge: 2010-09-13 | Disposition: A | Discharge status: Home / Self Care | Payer: Medicare Other | Source: Ambulatory Visit | Attending: Physician Assistant | Admitting: Physician Assistant

## 2010-09-13 DIAGNOSIS — R0600 Dyspnea, unspecified: Secondary | ICD-10-CM

## 2010-09-25 ENCOUNTER — Other Ambulatory Visit: Payer: Self-pay | Admitting: Family Medicine

## 2010-09-25 DIAGNOSIS — R911 Solitary pulmonary nodule: Secondary | ICD-10-CM

## 2010-10-09 ENCOUNTER — Ambulatory Visit
Admission: RE | Admit: 2010-10-09 | Discharge: 2010-10-09 | Disposition: A | Discharge status: Home / Self Care | Payer: Medicare Other | Source: Ambulatory Visit | Attending: Family Medicine | Admitting: Family Medicine

## 2010-10-09 DIAGNOSIS — R911 Solitary pulmonary nodule: Secondary | ICD-10-CM

## 2010-11-09 LAB — BASIC METABOLIC PANEL
CO2: 32
Calcium: 9.5
Chloride: 97
Glucose, Bld: 96
Sodium: 136

## 2010-11-09 LAB — DIFFERENTIAL
Basophils Relative: 0
Eosinophils Absolute: 0.2
Eosinophils Relative: 3
Lymphs Abs: 1.6
Monocytes Absolute: 0.5
Monocytes Relative: 9
Neutrophils Relative %: 62

## 2010-11-09 LAB — CBC
HCT: 38.5
MCHC: 33.4
MCV: 84.1
RBC: 4.58

## 2011-02-04 DIAGNOSIS — I495 Sick sinus syndrome: Secondary | ICD-10-CM | POA: Diagnosis not present

## 2011-02-07 DIAGNOSIS — Z7901 Long term (current) use of anticoagulants: Secondary | ICD-10-CM | POA: Diagnosis not present

## 2011-02-07 DIAGNOSIS — I4891 Unspecified atrial fibrillation: Secondary | ICD-10-CM | POA: Diagnosis not present

## 2011-02-12 DIAGNOSIS — I10 Essential (primary) hypertension: Secondary | ICD-10-CM | POA: Diagnosis not present

## 2011-02-12 DIAGNOSIS — R079 Chest pain, unspecified: Secondary | ICD-10-CM | POA: Diagnosis not present

## 2011-02-12 DIAGNOSIS — I4891 Unspecified atrial fibrillation: Secondary | ICD-10-CM | POA: Diagnosis not present

## 2011-02-14 DIAGNOSIS — R911 Solitary pulmonary nodule: Secondary | ICD-10-CM | POA: Diagnosis not present

## 2011-02-14 DIAGNOSIS — R05 Cough: Secondary | ICD-10-CM | POA: Diagnosis not present

## 2011-02-14 DIAGNOSIS — K140 Glossitis: Secondary | ICD-10-CM | POA: Diagnosis not present

## 2011-02-14 DIAGNOSIS — R109 Unspecified abdominal pain: Secondary | ICD-10-CM | POA: Diagnosis not present

## 2011-02-14 DIAGNOSIS — I2510 Atherosclerotic heart disease of native coronary artery without angina pectoris: Secondary | ICD-10-CM | POA: Diagnosis not present

## 2011-02-14 DIAGNOSIS — M109 Gout, unspecified: Secondary | ICD-10-CM | POA: Diagnosis not present

## 2011-02-14 DIAGNOSIS — K5900 Constipation, unspecified: Secondary | ICD-10-CM | POA: Diagnosis not present

## 2011-02-20 DIAGNOSIS — I10 Essential (primary) hypertension: Secondary | ICD-10-CM | POA: Diagnosis not present

## 2011-02-20 DIAGNOSIS — I4891 Unspecified atrial fibrillation: Secondary | ICD-10-CM | POA: Diagnosis not present

## 2011-02-20 DIAGNOSIS — R079 Chest pain, unspecified: Secondary | ICD-10-CM | POA: Diagnosis not present

## 2011-03-07 ENCOUNTER — Other Ambulatory Visit: Payer: Self-pay | Admitting: Gastroenterology

## 2011-03-07 DIAGNOSIS — I4891 Unspecified atrial fibrillation: Secondary | ICD-10-CM | POA: Diagnosis not present

## 2011-03-07 DIAGNOSIS — Z1211 Encounter for screening for malignant neoplasm of colon: Secondary | ICD-10-CM | POA: Diagnosis not present

## 2011-03-07 DIAGNOSIS — Z7901 Long term (current) use of anticoagulants: Secondary | ICD-10-CM | POA: Diagnosis not present

## 2011-03-07 DIAGNOSIS — R1084 Generalized abdominal pain: Secondary | ICD-10-CM | POA: Diagnosis not present

## 2011-03-08 DIAGNOSIS — M19079 Primary osteoarthritis, unspecified ankle and foot: Secondary | ICD-10-CM | POA: Diagnosis not present

## 2011-03-08 DIAGNOSIS — S93409A Sprain of unspecified ligament of unspecified ankle, initial encounter: Secondary | ICD-10-CM | POA: Diagnosis not present

## 2011-03-12 ENCOUNTER — Ambulatory Visit
Admission: RE | Admit: 2011-03-12 | Discharge: 2011-03-12 | Disposition: A | Discharge status: Home / Self Care | Payer: Medicare Other | Source: Ambulatory Visit | Attending: Gastroenterology | Admitting: Gastroenterology

## 2011-03-12 DIAGNOSIS — K7689 Other specified diseases of liver: Secondary | ICD-10-CM | POA: Diagnosis not present

## 2011-03-12 DIAGNOSIS — N281 Cyst of kidney, acquired: Secondary | ICD-10-CM | POA: Diagnosis not present

## 2011-03-12 DIAGNOSIS — R109 Unspecified abdominal pain: Secondary | ICD-10-CM | POA: Diagnosis not present

## 2011-03-12 MED ORDER — IOHEXOL 300 MG/ML  SOLN
125.0000 mL | Freq: Once | INTRAMUSCULAR | Status: AC | PRN
  Administered 2011-03-12: 125 mL via INTRAVENOUS

## 2011-03-14 ENCOUNTER — Other Ambulatory Visit (HOSPITAL_COMMUNITY): Payer: Medicare Other

## 2011-04-04 ENCOUNTER — Encounter (HOSPITAL_COMMUNITY): Payer: Self-pay | Admitting: *Deleted

## 2011-04-05 DIAGNOSIS — M7750 Other enthesopathy of unspecified foot: Secondary | ICD-10-CM | POA: Diagnosis not present

## 2011-04-05 DIAGNOSIS — I4891 Unspecified atrial fibrillation: Secondary | ICD-10-CM | POA: Diagnosis not present

## 2011-04-05 DIAGNOSIS — Z7901 Long term (current) use of anticoagulants: Secondary | ICD-10-CM | POA: Diagnosis not present

## 2011-04-05 DIAGNOSIS — M779 Enthesopathy, unspecified: Secondary | ICD-10-CM | POA: Diagnosis not present

## 2011-04-09 ENCOUNTER — Ambulatory Visit (HOSPITAL_COMMUNITY)
Admission: RE | Admit: 2011-04-09 | Discharge: 2011-04-09 | Disposition: A | Discharge status: Home / Self Care | Payer: Medicare Other | Source: Ambulatory Visit | Attending: Gastroenterology | Admitting: Gastroenterology

## 2011-04-09 ENCOUNTER — Encounter (HOSPITAL_COMMUNITY): Payer: Self-pay

## 2011-04-09 NOTE — Pre-Procedure Instructions (Signed)
Patient to take norvasc,betapaceand dexilant with sip of water the am of procedure

## 2011-04-10 NOTE — H&P (Addendum)
  [name] is an 78 y.o. female.   Chief Complaint: Generalized abdominal pain; Screening  HPI: [name] is referred for a consult due to abdominal pain. For several months she has had constant sharp lower abdominal pain that is worse in the LLQ. Reports chronic constipation and uses Senokot daily that helps her move her bowels daily. Denies any straining when she uses the Senokot. Denies any rectal bleeding. Sometimes the pain decreases somewhat with a BM. No relation of the pain to eating. She takes Coumadin chronically for atrial fibrillation. Her last colonoscopy was in 2003 and was normal.    Past Medical History  Diagnosis Date  . Hypertension     medication controled  . Angina   . Heart murmur   . Dysrhythmia   . Recurrent upper respiratory infection (URI)     one month ago  . Stroke   . Pneumonia     childhood  . GERD (gastroesophageal reflux disease)   . Anxiety   . Depression     Past Surgical History  Procedure Date  . Tonsillectomy   . Tubal ligation   . Abdominal hysterectomy   . Pacemaker insertion     2008  . Back surgery     2010    No prescriptions prior to admission    Allergies: No Known Allergies  Family History  Problem Relation Age of Onset  . Malignant hyperthermia Neg Hx     Social History:  reports that she has never smoked. She does not have any smokeless tobacco history on file. She reports that she drinks alcohol. She reports that she does not use illicit drugs.   ROS: Positive for easy bruising, hoarseness, sore throat, shortness of breath, cough, joint pain, weakness. Otherwise negative except per HPI  No results found for this or any previous visit (from the past 48 hour(s)). No results found.  VITALS: see pre-procedure vitals There were no vitals filed for this visit.  PHYSICAL EXAM: General appearance: alert, well-nourished, no acute distress, elderly GI: suprapubic surgical scar, tender in lower quadrants with  voluntary guarding, soft, nondistended, +BS   Assessment/Plan 78yo woman with chronic intermittent abdominal pain that is likely due to adhesions/scar tissue. CT was negative for any acute findings. Repeat screening colonoscopy needed.  Amylee Lodato C. 04/10/2011, 1:13 PM

## 2011-04-11 ENCOUNTER — Ambulatory Visit (HOSPITAL_COMMUNITY)
Admission: RE | Admit: 2011-04-11 | Discharge: 2011-04-11 | Disposition: A | Discharge status: Home / Self Care | Payer: Medicare Other | Source: Ambulatory Visit | Attending: Gastroenterology | Admitting: Gastroenterology

## 2011-04-11 ENCOUNTER — Encounter (HOSPITAL_COMMUNITY): Payer: Self-pay | Admitting: *Deleted

## 2011-04-11 ENCOUNTER — Encounter (HOSPITAL_COMMUNITY): Payer: Self-pay | Admitting: Anesthesiology

## 2011-04-11 ENCOUNTER — Ambulatory Visit (HOSPITAL_COMMUNITY): Payer: Medicare Other | Admitting: Anesthesiology

## 2011-04-11 ENCOUNTER — Encounter (HOSPITAL_COMMUNITY)
Admission: RE | Disposition: A | Discharge status: Home / Self Care | Payer: Self-pay | Source: Ambulatory Visit | Attending: Gastroenterology

## 2011-04-11 DIAGNOSIS — R1084 Generalized abdominal pain: Secondary | ICD-10-CM | POA: Diagnosis present

## 2011-04-11 DIAGNOSIS — Z8673 Personal history of transient ischemic attack (TIA), and cerebral infarction without residual deficits: Secondary | ICD-10-CM | POA: Insufficient documentation

## 2011-04-11 DIAGNOSIS — I10 Essential (primary) hypertension: Secondary | ICD-10-CM | POA: Insufficient documentation

## 2011-04-11 DIAGNOSIS — K648 Other hemorrhoids: Secondary | ICD-10-CM | POA: Diagnosis not present

## 2011-04-11 DIAGNOSIS — R1032 Left lower quadrant pain: Secondary | ICD-10-CM | POA: Insufficient documentation

## 2011-04-11 DIAGNOSIS — Z1211 Encounter for screening for malignant neoplasm of colon: Secondary | ICD-10-CM | POA: Diagnosis not present

## 2011-04-11 DIAGNOSIS — K219 Gastro-esophageal reflux disease without esophagitis: Secondary | ICD-10-CM | POA: Insufficient documentation

## 2011-04-11 HISTORY — DX: Cardiac murmur, unspecified: R01.1

## 2011-04-11 HISTORY — DX: Anxiety disorder, unspecified: F41.9

## 2011-04-11 HISTORY — DX: Respiratory tuberculosis unspecified: A15.9

## 2011-04-11 HISTORY — DX: Major depressive disorder, single episode, unspecified: F32.9

## 2011-04-11 HISTORY — DX: Depression, unspecified: F32.A

## 2011-04-11 HISTORY — DX: Gastro-esophageal reflux disease without esophagitis: K21.9

## 2011-04-11 HISTORY — DX: Essential (primary) hypertension: I10

## 2011-04-11 HISTORY — DX: Pneumonia, unspecified organism: J18.9

## 2011-04-11 HISTORY — DX: Unspecified osteoarthritis, unspecified site: M19.90

## 2011-04-11 HISTORY — DX: Cerebral infarction, unspecified: I63.9

## 2011-04-11 LAB — BASIC METABOLIC PANEL
BUN: 6 mg/dL (ref 6–23)
Calcium: 9.3 mg/dL (ref 8.4–10.5)
Creatinine, Ser: 0.76 mg/dL (ref 0.50–1.10)
GFR calc Af Amer: >90 mL/min (ref >90)
GFR calc non Af Amer: 80 mL/min — ABNORMAL LOW (ref >90)
Glucose, Bld: 105 mg/dL — ABNORMAL HIGH (ref 70–99)

## 2011-04-11 SURGERY — COLONOSCOPY WITH PROPOFOL
Anesthesia: Monitor Anesthesia Care

## 2011-04-11 MED ORDER — MIDAZOLAM HCL 5 MG/5ML IJ SOLN
INTRAMUSCULAR | Status: DC | PRN
  Administered 2011-04-11: 2 mg via INTRAVENOUS

## 2011-04-11 MED ORDER — KETAMINE HCL 10 MG/ML IJ SOLN
INTRAMUSCULAR | Status: DC | PRN
  Administered 2011-04-11 (×2): 10 mg via INTRAVENOUS

## 2011-04-11 MED ORDER — FENTANYL CITRATE 0.05 MG/ML IJ SOLN
INTRAMUSCULAR | Status: DC | PRN
  Administered 2011-04-11: 50 ug via INTRAVENOUS
  Administered 2011-04-11: 25 ug via INTRAVENOUS

## 2011-04-11 MED ORDER — PROPOFOL 10 MG/ML IV EMUL
INTRAVENOUS | Status: DC | PRN
  Administered 2011-04-11: 100 ug/kg/min via INTRAVENOUS

## 2011-04-11 MED ORDER — LIDOCAINE HCL (CARDIAC) 20 MG/ML IV SOLN
INTRAVENOUS | Status: DC | PRN
  Administered 2011-04-11: 1000 mg via INTRAVENOUS

## 2011-04-11 MED ORDER — FENTANYL CITRATE 0.05 MG/ML IJ SOLN: 25.0000 ug | INTRAMUSCULAR | Status: DC | PRN

## 2011-04-11 MED ORDER — LACTATED RINGERS IV SOLN
INTRAVENOUS | Status: DC
  Administered 2011-04-11: 1000 mL via INTRAVENOUS

## 2011-04-11 SURGICAL SUPPLY — 22 items

## 2011-04-11 NOTE — Anesthesia Postprocedure Evaluation (Signed)
  Anesthesia Post-op Note  Patient: [name]  Procedure(s) Performed: Procedure(s) (LRB): COLONOSCOPY WITH PROPOFOL (N/A)  Patient Location: PACU  Anesthesia Type: MAC  Level of Consciousness: oriented and sedated  Airway and Oxygen Therapy: Patient Spontanous Breathing  Post-op Pain: none  Post-op Assessment: Post-op Vital signs reviewed, Patient's Cardiovascular Status Stable, Respiratory Function Stable and Patent Airway  Post-op Vital Signs: stable  Complications: No apparent anesthesia complications

## 2011-04-11 NOTE — Preoperative (Signed)
Beta Blockers   Reason not to administer Beta Blockers:Beta blocker taken this am 04-11-11

## 2011-04-11 NOTE — Brief Op Note (Signed)
See endopro note 

## 2011-04-11 NOTE — Transfer of Care (Signed)
Immediate Anesthesia Transfer of Care Note  Patient: [name]  Procedure(s) Performed: Procedure(s) (LRB): COLONOSCOPY WITH PROPOFOL (N/A)  Patient Location: PACU and Endoscopy Unit  Anesthesia Type: MAC and General  Level of Consciousness: awake, alert  and patient cooperative  Airway & Oxygen Therapy: Patient Spontanous Breathing and Patient connected to face mask oxygen  Post-op Assessment: Report given to PACU RN and Post -op Vital signs reviewed and stable  Post vital signs: Reviewed and stable  Complications: No apparent anesthesia complications

## 2011-04-11 NOTE — Discharge Instructions (Addendum)
Resume taking Coumadin as previously directed.  Colonoscopy Care After Read the instructions outlined below and refer to this sheet in the next few weeks. These discharge instructions provide you with general information on caring for yourself after you leave the hospital. Your doctor may also give you specific instructions. While your treatment has been planned according to the most current medical practices available, unavoidable complications occasionally occur. If you have any problems or questions after discharge, call your doctor. HOME CARE INSTRUCTIONS ACTIVITY:  You may resume your regular activity, but move at a slower pace for the next 24 hours.   Take frequent rest periods for the next 24 hours.   Walking will help get rid of the air and reduce the bloated feeling in your belly (abdomen).   No driving for 24 hours (because of the medicine (anesthesia) used during the test).   You may shower.   Do not sign any important legal documents or operate any machinery for 24 hours (because of the anesthesia used during the test).  NUTRITION:  Drink plenty of fluids.   You may resume your normal diet as instructed by your doctor.   Begin with a light meal and progress to your normal diet. Heavy or fried foods are harder to digest and may make you feel sick to your stomach (nauseated).   Avoid alcoholic beverages for 24 hours or as instructed.  MEDICATIONS:  You may resume your normal medications unless your doctor tells you otherwise.  WHAT TO EXPECT TODAY:  Some feelings of bloating in the abdomen.   Passage of more gas than usual.   Spotting of blood in your stool or on the toilet paper.  IF YOU HAD POLYPS REMOVED DURING THE COLONOSCOPY:  No aspirin products for 7 days or as instructed.   No alcohol for 7 days or as instructed.   Eat a soft diet for the next 24 hours.  FINDING OUT THE RESULTS OF YOUR TEST Not all test results are available during your visit. If your  test results are not back during the visit, make an appointment with your caregiver to find out the results. Do not assume everything is normal if you have not heard from your caregiver or the medical facility. It is important for you to follow up on all of your test results.  SEEK IMMEDIATE MEDICAL CARE IF:  You have more than a spotting of blood in your stool.   Your belly is swollen (abdominal distention).   You are nauseated or vomiting.   You have a fever.   You have abdominal pain or discomfort that is severe or gets worse throughout the day.

## 2011-04-11 NOTE — Anesthesia Preprocedure Evaluation (Addendum)
Anesthesia Evaluation  Patient identified by MRN, date of birth, ID band Patient awake    Reviewed: Allergy & Precautions, H&P , NPO status , Patient's Chart, lab work & pertinent test results, reviewed documented beta blocker date and time   Airway Mallampati: II TM Distance: >3 FB Neck ROM: Full    Dental  (+) Upper Dentures   Pulmonary neg pulmonary ROS,  breath sounds clear to auscultation  + decreased breath sounds      Cardiovascular hypertension, Pt. on medications + dysrhythmias Atrial Fibrillation Rhythm:Regular Rate:Normal  Pacemaker, ?SSS Off coumadin 3-9   Neuro/Psych Pt says her CVA was in the early 90's. Denies neurologic deficits CVA, No Residual Symptoms negative psych ROS   GI/Hepatic negative GI ROS, Neg liver ROS, Hx colon polyps   Endo/Other  Diabetes mellitus-Diet controlled  Renal/GU negative Renal ROS  negative genitourinary   Musculoskeletal negative musculoskeletal ROS (+)   Abdominal   Peds negative pediatric ROS (+)  Hematology negative hematology ROS (+)   Anesthesia Other Findings   Reproductive/Obstetrics negative OB ROS                           Anesthesia Physical Anesthesia Plan  ASA: III  Anesthesia Plan: MAC   Post-op Pain Management:    Induction: Intravenous  Airway Management Planned: Mask  Additional Equipment:   Intra-op Plan:   Post-operative Plan:   Informed Consent:   Dental advisory given  Plan Discussed with: CRNA and Surgeon  Anesthesia Plan Comments:         Anesthesia Quick Evaluation

## 2011-04-11 NOTE — Interval H&P Note (Signed)
History and Physical Interval Note:  04/11/2011 8:35 AM  [name]  has presented today for surgery, with the diagnosis of screening  The various methods of treatment have been discussed with the patient and family. After consideration of risks, benefits and other options for treatment, the patient has consented to  Procedure(s) (LRB): COLONOSCOPY WITH PROPOFOL (N/A) as a surgical intervention .  The patients' history has been reviewed, patient examined, no change in status, stable for surgery.  I have reviewed the patients' chart and labs.  Questions were answered to the patient's satisfaction.     Eliel Dudding C.

## 2011-04-11 NOTE — Op Note (Signed)
[facility] [address] [place], [place]  [address]  COLONOSCOPY PROCEDURE REPORT  PATIENT:  [name], [name]  MR#:  [id number] BIRTHDATE:  [birth year], 78 yrs. old  GENDER:  female ENDOSCOPIST:  Charlott Rakes, MD REF. BY:  Holley Bouche, M.D. PROCEDURE DATE:  04/11/2011 PROCEDURE:  Colonoscopy 19147 ASA CLASS:  Class III INDICATIONS:  Screening MEDICATIONS:   See Anesthesia Report.  DESCRIPTION OF PROCEDURE:   After the risks benefits and alternatives of the procedure were thoroughly explained, informed consent was obtained.  The Pentax Peds Colonoscope [id number] and [id number]) endoscope was introduced through the anus and advanced to the cecum, which was identified by both the appendix and ileocecal valve, without limitations.  The quality of the prep was good..  The instrument was then slowly withdrawn as the colon was fully examined. <<PROCEDUREIMAGES>>  FINDINGS:  Rectal exam unremarkable.  Pediatric colonoscope inserted into the colon and advanced to the cecum, where the appendiceal orifice and ileocecal valve were identified.    In order to reach the cecum repeated loop reduction and abdominal pressure was necessary. On careful withdrawal of the colonoscope no mucosal abnormalities were noted.    Retroflexion revealed small internal hemorrhoids.  COMPLICATIONS:  None  IMPRESSION:  1. Small internal hemorrhoids otherwise normal colonoscopy  RECOMMENDATIONS:  1. No repeat colonoscopy due to age and comorbidities  ______________________________ Charlott Rakes, MD  CC:  Holley Bouche, MD  n. Rosalie DoctorCharlott Rakes at 04/11/2011 09:51 AM  [facility], [phone]

## 2011-04-23 DIAGNOSIS — K219 Gastro-esophageal reflux disease without esophagitis: Secondary | ICD-10-CM | POA: Diagnosis not present

## 2011-04-23 DIAGNOSIS — IMO0001 Reserved for inherently not codable concepts without codable children: Secondary | ICD-10-CM | POA: Diagnosis not present

## 2011-04-24 DIAGNOSIS — I4891 Unspecified atrial fibrillation: Secondary | ICD-10-CM | POA: Diagnosis not present

## 2011-04-24 DIAGNOSIS — Z7901 Long term (current) use of anticoagulants: Secondary | ICD-10-CM | POA: Diagnosis not present

## 2011-05-17 DIAGNOSIS — H4011X0 Primary open-angle glaucoma, stage unspecified: Secondary | ICD-10-CM | POA: Diagnosis not present

## 2011-05-17 DIAGNOSIS — H409 Unspecified glaucoma: Secondary | ICD-10-CM | POA: Diagnosis not present

## 2011-05-23 ENCOUNTER — Other Ambulatory Visit: Payer: Self-pay | Admitting: Family Medicine

## 2011-05-23 ENCOUNTER — Ambulatory Visit
Admission: RE | Admit: 2011-05-23 | Discharge: 2011-05-23 | Disposition: A | Discharge status: Home / Self Care | Payer: Medicare Other | Source: Ambulatory Visit | Attending: Family Medicine | Admitting: Family Medicine

## 2011-05-23 DIAGNOSIS — M546 Pain in thoracic spine: Secondary | ICD-10-CM

## 2011-05-23 DIAGNOSIS — I4891 Unspecified atrial fibrillation: Secondary | ICD-10-CM | POA: Diagnosis not present

## 2011-05-23 DIAGNOSIS — Z7901 Long term (current) use of anticoagulants: Secondary | ICD-10-CM | POA: Diagnosis not present

## 2011-05-29 DIAGNOSIS — Z950 Presence of cardiac pacemaker: Secondary | ICD-10-CM | POA: Diagnosis not present

## 2011-06-18 DIAGNOSIS — M546 Pain in thoracic spine: Secondary | ICD-10-CM | POA: Diagnosis not present

## 2011-06-18 DIAGNOSIS — M25519 Pain in unspecified shoulder: Secondary | ICD-10-CM | POA: Diagnosis not present

## 2011-06-20 DIAGNOSIS — Z7901 Long term (current) use of anticoagulants: Secondary | ICD-10-CM | POA: Diagnosis not present

## 2011-06-20 DIAGNOSIS — I4891 Unspecified atrial fibrillation: Secondary | ICD-10-CM | POA: Diagnosis not present

## 2011-06-26 DIAGNOSIS — M546 Pain in thoracic spine: Secondary | ICD-10-CM | POA: Diagnosis not present

## 2011-06-26 DIAGNOSIS — M25519 Pain in unspecified shoulder: Secondary | ICD-10-CM | POA: Diagnosis not present

## 2011-07-04 DIAGNOSIS — M546 Pain in thoracic spine: Secondary | ICD-10-CM | POA: Diagnosis not present

## 2011-07-04 DIAGNOSIS — F411 Generalized anxiety disorder: Secondary | ICD-10-CM | POA: Diagnosis not present

## 2011-07-04 DIAGNOSIS — M25519 Pain in unspecified shoulder: Secondary | ICD-10-CM | POA: Diagnosis not present

## 2011-07-04 DIAGNOSIS — R05 Cough: Secondary | ICD-10-CM | POA: Diagnosis not present

## 2011-07-04 DIAGNOSIS — M25569 Pain in unspecified knee: Secondary | ICD-10-CM | POA: Diagnosis not present

## 2011-07-04 DIAGNOSIS — K219 Gastro-esophageal reflux disease without esophagitis: Secondary | ICD-10-CM | POA: Diagnosis not present

## 2011-07-04 DIAGNOSIS — J309 Allergic rhinitis, unspecified: Secondary | ICD-10-CM | POA: Diagnosis not present

## 2011-07-04 DIAGNOSIS — R42 Dizziness and giddiness: Secondary | ICD-10-CM | POA: Diagnosis not present

## 2011-07-09 DIAGNOSIS — M25519 Pain in unspecified shoulder: Secondary | ICD-10-CM | POA: Diagnosis not present

## 2011-07-09 DIAGNOSIS — M546 Pain in thoracic spine: Secondary | ICD-10-CM | POA: Diagnosis not present

## 2011-07-16 DIAGNOSIS — M25519 Pain in unspecified shoulder: Secondary | ICD-10-CM | POA: Diagnosis not present

## 2011-07-16 DIAGNOSIS — M546 Pain in thoracic spine: Secondary | ICD-10-CM | POA: Diagnosis not present

## 2011-07-18 DIAGNOSIS — Z7901 Long term (current) use of anticoagulants: Secondary | ICD-10-CM | POA: Diagnosis not present

## 2011-07-18 DIAGNOSIS — I4891 Unspecified atrial fibrillation: Secondary | ICD-10-CM | POA: Diagnosis not present

## 2011-08-26 DIAGNOSIS — Z7901 Long term (current) use of anticoagulants: Secondary | ICD-10-CM | POA: Diagnosis not present

## 2011-08-26 DIAGNOSIS — I4891 Unspecified atrial fibrillation: Secondary | ICD-10-CM | POA: Diagnosis not present

## 2011-09-09 ENCOUNTER — Emergency Department (HOSPITAL_COMMUNITY): Payer: Medicare Other

## 2011-09-09 ENCOUNTER — Emergency Department (HOSPITAL_COMMUNITY)
Admission: EM | Admit: 2011-09-09 | Discharge: 2011-09-09 | Disposition: A | Discharge status: Home / Self Care | Payer: Medicare Other | Attending: Emergency Medicine | Admitting: Emergency Medicine

## 2011-09-09 ENCOUNTER — Encounter (HOSPITAL_COMMUNITY): Payer: Self-pay | Admitting: Emergency Medicine

## 2011-09-09 DIAGNOSIS — I10 Essential (primary) hypertension: Secondary | ICD-10-CM | POA: Insufficient documentation

## 2011-09-09 DIAGNOSIS — R109 Unspecified abdominal pain: Secondary | ICD-10-CM | POA: Insufficient documentation

## 2011-09-09 DIAGNOSIS — K219 Gastro-esophageal reflux disease without esophagitis: Secondary | ICD-10-CM | POA: Diagnosis not present

## 2011-09-09 DIAGNOSIS — E119 Type 2 diabetes mellitus without complications: Secondary | ICD-10-CM | POA: Insufficient documentation

## 2011-09-09 DIAGNOSIS — M47812 Spondylosis without myelopathy or radiculopathy, cervical region: Secondary | ICD-10-CM | POA: Diagnosis not present

## 2011-09-09 DIAGNOSIS — R059 Cough, unspecified: Secondary | ICD-10-CM | POA: Diagnosis not present

## 2011-09-09 DIAGNOSIS — R0602 Shortness of breath: Secondary | ICD-10-CM | POA: Diagnosis not present

## 2011-09-09 DIAGNOSIS — R071 Chest pain on breathing: Secondary | ICD-10-CM | POA: Insufficient documentation

## 2011-09-09 DIAGNOSIS — Z950 Presence of cardiac pacemaker: Secondary | ICD-10-CM | POA: Insufficient documentation

## 2011-09-09 DIAGNOSIS — R209 Unspecified disturbances of skin sensation: Secondary | ICD-10-CM | POA: Diagnosis not present

## 2011-09-09 DIAGNOSIS — M542 Cervicalgia: Secondary | ICD-10-CM | POA: Diagnosis not present

## 2011-09-09 DIAGNOSIS — Z79899 Other long term (current) drug therapy: Secondary | ICD-10-CM | POA: Diagnosis not present

## 2011-09-09 DIAGNOSIS — R200 Anesthesia of skin: Secondary | ICD-10-CM

## 2011-09-09 DIAGNOSIS — M129 Arthropathy, unspecified: Secondary | ICD-10-CM | POA: Insufficient documentation

## 2011-09-09 DIAGNOSIS — F329 Major depressive disorder, single episode, unspecified: Secondary | ICD-10-CM | POA: Insufficient documentation

## 2011-09-09 DIAGNOSIS — F411 Generalized anxiety disorder: Secondary | ICD-10-CM | POA: Insufficient documentation

## 2011-09-09 DIAGNOSIS — R42 Dizziness and giddiness: Secondary | ICD-10-CM | POA: Diagnosis not present

## 2011-09-09 DIAGNOSIS — I495 Sick sinus syndrome: Secondary | ICD-10-CM | POA: Diagnosis not present

## 2011-09-09 DIAGNOSIS — R05 Cough: Secondary | ICD-10-CM | POA: Insufficient documentation

## 2011-09-09 DIAGNOSIS — F3289 Other specified depressive episodes: Secondary | ICD-10-CM | POA: Insufficient documentation

## 2011-09-09 DIAGNOSIS — R0789 Other chest pain: Secondary | ICD-10-CM

## 2011-09-09 DIAGNOSIS — R079 Chest pain, unspecified: Secondary | ICD-10-CM | POA: Insufficient documentation

## 2011-09-09 LAB — CBC WITH DIFFERENTIAL/PLATELET
Basophils Absolute: 0 10*3/uL (ref 0.0–0.1)
Eosinophils Absolute: 0.2 10*3/uL (ref 0.0–0.7)
Eosinophils Relative: 2 % (ref 0–5)
HCT: 42.1 % (ref 36.0–46.0)
Lymphocytes Relative: 30 % (ref 12–46)
MCH: 28.2 pg (ref 26.0–34.0)
MCHC: 33.7 g/dL (ref 30.0–36.0)
MCV: 83.7 fL (ref 78.0–100.0)
Monocytes Absolute: 0.5 10*3/uL (ref 0.1–1.0)
Platelets: 234 10*3/uL (ref 150–400)
RDW: 13.9 % (ref 11.5–15.5)
WBC: 7.2 10*3/uL (ref 4.0–10.5)

## 2011-09-09 LAB — URINE MICROSCOPIC-ADD ON

## 2011-09-09 LAB — URINALYSIS, ROUTINE W REFLEX MICROSCOPIC
Nitrite: NEGATIVE
Specific Gravity, Urine: 1.017 (ref 1.005–1.030)
Urobilinogen, UA: 0.2 mg/dL (ref 0.0–1.0)
pH: 6.5 (ref 5.0–8.0)

## 2011-09-09 LAB — COMPREHENSIVE METABOLIC PANEL
AST: 17 U/L (ref 0–37)
CO2: 31 mEq/L (ref 19–32)
Calcium: 10 mg/dL (ref 8.4–10.5)
Creatinine, Ser: 0.92 mg/dL (ref 0.50–1.10)
GFR calc Af Amer: 68 mL/min — ABNORMAL LOW (ref >90)
GFR calc non Af Amer: 59 mL/min — ABNORMAL LOW (ref >90)
Glucose, Bld: 119 mg/dL — ABNORMAL HIGH (ref 70–99)
Total Protein: 7.5 g/dL (ref 6.0–8.3)

## 2011-09-09 LAB — POCT I-STAT TROPONIN I: Troponin i, poc: 0 ng/mL (ref 0.00–0.08)

## 2011-09-09 MED ORDER — TRAMADOL HCL 50 MG PO TABS: ORAL_TABLET | ORAL | Status: DC

## 2011-09-09 MED ORDER — METHOCARBAMOL 500 MG PO TABS
750.0000 mg | ORAL_TABLET | Freq: Three times a day (TID) | ORAL | Status: DC
  Administered 2011-09-09: 750 mg via ORAL
  Filled 2011-09-09: qty 1.5

## 2011-09-09 MED ORDER — KETOROLAC TROMETHAMINE 30 MG/ML IJ SOLN
30.0000 mg | Freq: Once | INTRAMUSCULAR | Status: AC
  Administered 2011-09-09: 30 mg via INTRAVENOUS
  Filled 2011-09-09: qty 1

## 2011-09-09 MED ORDER — METHOCARBAMOL 750 MG PO TABS: ORAL_TABLET | ORAL | Status: DC

## 2011-09-09 MED ORDER — SODIUM CHLORIDE 0.9 % IV BOLUS (SEPSIS): 700.0000 mL | Freq: Once | INTRAVENOUS | Status: DC

## 2011-09-09 NOTE — ED Provider Notes (Signed)
History     CSN: [id number]  Arrival date & time 09/09/11  1459   First MD Initiated Contact with Patient 09/09/11 1532      Chief Complaint  Patient presents with  . Chest Pain  . Abdominal Pain  . Dizziness    (Consider location/radiation/quality/duration/timing/severity/associated sxs/prior treatment) HPI  Patient reports she's been having pain in her left posterior chest/flank area that radiates around to her left lower anterior rib cage and underneath her left breast. She also states sometimes she feels knots under her skin. She states it's been going on for 2 months and it was initially intermittent however it has been constant now for the past week. The pain is dull and achy. She states it feels worse when she moves, bends over, or walks. She states if she sits down she has no pain. She denies any known trauma or injury.  PT c/o central chest discomfort that she has had for years, and also is tender over her pacemaker site.   Patient also reports having a cough with clear mucus for the past month. She denies fever or chills.  Patient denies dysuria, frequency or hematuria.  Patient states she also has numbness in her left hand for the past 3 days and it involves all her fingers except her foam. She relates she's also has had some pain in the left side of her neck for the past 2 weeks it hurts when she turns her head to the right.  Of note patient states that her husband was admitted to the hospital on this date one year ago and died about 2 weeks later with metastatic cancer.  PCP Dr. Tiburcio Pea Cardiologist Dr. Katrinka Blazing  Past Medical History  Diagnosis Date  . Hypertension     medication controled  . Angina   . Heart murmur   . Dysrhythmia   . Recurrent upper respiratory infection (URI)     one month ago  . Stroke   . Pneumonia     childhood  . GERD (gastroesophageal reflux disease)   . Anxiety   . Depression   . Arthritis   . Diabetes mellitus   . Tuberculosis     during childhood unsure of time  . Gout     Past Surgical History  Procedure Date  . Tonsillectomy   . Tubal ligation   . Abdominal hysterectomy   . Pacemaker insertion     2008  . Back surgery     2010  . Cardiac catheterization     Family History  Problem Relation Age of Onset  . Malignant hyperthermia Neg Hx     History  Substance Use Topics  . Smoking status: Never Smoker   . Smokeless tobacco: Not on file  . Alcohol Use: Yes     rarely  retired Manufacturing engineer widowed  OB History    Grav Para Term Preterm Abortions TAB SAB Ect Mult Living                  Review of Systems  All other systems reviewed and are negative.    Allergies  Prednisone  Home Medications   Current Outpatient Rx  Name Route Sig Dispense Refill  . ACETAMINOPHEN 500 MG PO TABS Oral Take 1,000 mg by mouth every 6 (six) hours as needed. Pain.    . ALOE VERA PO Oral Take by mouth daily as needed. 1/4 cup liquid. For joint health.    . AMLODIPINE BESYLATE 5 MG PO TABS Oral  Take 5 mg by mouth daily.    . B COMPLEX PO TABS Oral Take 1 tablet by mouth daily.    Marland Kitchen BETA CAROTENE [address] UNITS PO CAPS Oral Take 25,000 Units by mouth daily as needed. Eye health.    Marland Kitchen CALCIUM CARBONATE 600 MG PO TABS Oral Take 600 mg by mouth daily.     Marland Kitchen CINNAMON 500 MG PO TABS Oral Take 2 tablets by mouth daily as needed. Blood sugar.    . COLCHICINE 0.6 MG PO TABS Oral Take 0.6 mg by mouth daily as needed. Gout flare-up.    . DEXLANSOPRAZOLE 60 MG PO CPDR Oral Take 60 mg by mouth daily.     Marland Kitchen DIAZEPAM 5 MG PO TABS Oral Take 5 mg by mouth daily as needed. Anxiety.    . MULTIVITAMIN PO Oral Take by mouth.    . NYSTATIN 100000 UNIT/ML MT SUSP Oral Take 500,000 Units by mouth daily as needed. For tongue irritation.    Marland Kitchen POTASSIUM CHLORIDE CRYS ER 20 MEQ PO TBCR Oral Take 20 mEq by mouth daily.    . SENNOSIDES 8.6 MG PO TABS Oral Take 2 tablets by mouth daily.     Marland Kitchen SOTALOL HCL 160 MG PO TABS Oral Take 160  mg by mouth 2 (two) times daily.    . TRIAMTERENE-HCTZ 75-50 MG PO TABS Oral Take 1 tablet by mouth daily.    Marland Kitchen VALSARTAN 320 MG PO TABS Oral Take 320 mg by mouth daily.    Marland Kitchen VITAMIN C 500 MG PO TABS Oral Take 500 mg by mouth daily.    . WARFARIN SODIUM 5 MG PO TABS Oral Take 5-7.5 mg by mouth daily. Take 7.5mg  (1 and 1/2 tablets) once daily on Tuesday, Thursday, and Saturday.   Take 5mg  (1 tablet) once daily on Monday, Wednesday, Friday, and Sunday.      BP 114/81  Pulse 75  Temp 98.1 F (36.7 C) (Oral)  Resp 22  SpO2 99%  Vital signs normal    Physical Exam  Nursing note and vitals reviewed. Constitutional: She is oriented to person, place, and time. She appears well-developed and well-nourished.  Non-toxic appearance. She does not appear ill. No distress.  HENT:  Head: Normocephalic and atraumatic.  Right Ear: External ear normal.  Left Ear: External ear normal.  Nose: Nose normal. No mucosal edema or rhinorrhea.  Mouth/Throat: Oropharynx is clear and moist and mucous membranes are normal. No dental abscesses or uvula swelling.  Eyes: Conjunctivae and EOM are normal. Pupils are equal, round, and reactive to light.  Neck: Normal range of motion and full passive range of motion without pain. Neck supple.         Not tender over midline, painful to turn head to right  Cardiovascular: Normal rate, regular rhythm and normal heart sounds.  Exam reveals no gallop and no friction rub.   No murmur heard. Pulmonary/Chest: Effort normal and breath sounds normal. No respiratory distress. She has no wheezes. She has no rhonchi. She has no rales.   She exhibits no tenderness and no crepitus.         Pacemaker site looks good, no swelling or redness  Abdominal: Soft. Normal appearance and bowel sounds are normal. She exhibits no distension. There is no tenderness. There is no rebound and no guarding.  Musculoskeletal: Normal range of motion. She exhibits no edema and no tenderness.        Moves all extremities well.   Neurological: She is alert  and oriented to person, place, and time. She has normal strength. No cranial nerve deficit.  Skin: Skin is warm, dry and intact. No rash noted. No erythema. No pallor.  Psychiatric: She has a normal mood and affect. Her speech is normal and behavior is normal. Her mood appears not anxious.    ED Course  Procedures (including critical care time)   Medications  acetaminophen (TYLENOL) 500 MG tablet (not administered)  methocarbamol (ROBAXIN) tablet 750 mg (750 mg Oral Given 09/09/11 1806)  sodium chloride 0.9 % bolus 700 mL (not administered)  ketorolac (TORADOL) 30 MG/ML injection 30 mg (30 mg Intravenous Given 09/09/11 1634)   Pt's pain is improved.     Results for orders placed during the hospital encounter of 09/09/11  PROTIME-INR      Component Value Range   Prothrombin Time 26.0 (*) 11.6 - 15.2 seconds   INR 2.34 (*) 0.00 - 1.49  CBC WITH DIFFERENTIAL      Component Value Range   WBC 7.2  4.0 - 10.5 K/uL   RBC 5.03  3.87 - 5.11 MIL/uL   Hemoglobin 14.2  12.0 - 15.0 g/dL   HCT 54.0  98.1 - 19.1 %   MCV 83.7  78.0 - 100.0 fL   MCH 28.2  26.0 - 34.0 pg   MCHC 33.7  30.0 - 36.0 g/dL   RDW 47.8  29.5 - 62.1 %   Platelets 234  150 - 400 K/uL   Neutrophils Relative 60  43 - 77 %   Neutro Abs 4.3  1.7 - 7.7 K/uL   Lymphocytes Relative 30  12 - 46 %   Lymphs Abs 2.2  0.7 - 4.0 K/uL   Monocytes Relative 7  3 - 12 %   Monocytes Absolute 0.5  0.1 - 1.0 K/uL   Eosinophils Relative 2  0 - 5 %   Eosinophils Absolute 0.2  0.0 - 0.7 K/uL   Basophils Relative 0  0 - 1 %   Basophils Absolute 0.0  0.0 - 0.1 K/uL  COMPREHENSIVE METABOLIC PANEL      Component Value Range   Sodium 133 (*) 135 - 145 mEq/L   Potassium 3.4 (*) 3.5 - 5.1 mEq/L   Chloride 93 (*) 96 - 112 mEq/L   CO2 31  19 - 32 mEq/L   Glucose, Bld 119 (*) 70 - 99 mg/dL   BUN 12  6 - 23 mg/dL   Creatinine, Ser 3.08  0.50 - 1.10 mg/dL   Calcium 65.7  8.4 - 84.6  mg/dL   Total Protein 7.5  6.0 - 8.3 g/dL   Albumin 3.8  3.5 - 5.2 g/dL   AST 17  0 - 37 U/L   ALT 16  0 - 35 U/L   Alkaline Phosphatase 67  39 - 117 U/L   Total Bilirubin 0.6  0.3 - 1.2 mg/dL   GFR calc non Af Amer 59 (*) >90 mL/min   GFR calc Af Amer 68 (*) >90 mL/min  URINALYSIS, ROUTINE W REFLEX MICROSCOPIC      Component Value Range   Color, Urine YELLOW  YELLOW   APPearance CLOUDY (*) CLEAR   Specific Gravity, Urine 1.017  1.005 - 1.030   pH 6.5  5.0 - 8.0   Glucose, UA NEGATIVE  NEGATIVE mg/dL   Hgb urine dipstick SMALL (*) NEGATIVE   Bilirubin Urine NEGATIVE  NEGATIVE   Ketones, ur NEGATIVE  NEGATIVE mg/dL   Protein, ur NEGATIVE  NEGATIVE mg/dL  Urobilinogen, UA 0.2  0.0 - 1.0 mg/dL   Nitrite NEGATIVE  NEGATIVE   Leukocytes, UA TRACE (*) NEGATIVE  POCT I-STAT TROPONIN I      Component Value Range   Troponin i, poc 0.00  0.00 - 0.08 ng/mL   Comment 3           URINE MICROSCOPIC-ADD ON      Component Value Range   Squamous Epithelial / LPF FEW (*) RARE   WBC, UA 3-6  <3 WBC/hpf   RBC / HPF 3-6  <3 RBC/hpf   Bacteria, UA FEW (*) RARE   Urine-Other MUCOUS PRESENT     Laboratory interpretation all normal except mild hyponatremia and hypokalemia, renal insufficiency, therapeutic INR   Dg Ribs Unilateral W/chest Left  09/09/2011  *RADIOLOGY REPORT*  Clinical Data: Left lower lateral and anterior rib pain.  Shortness of breath.  LEFT RIBS AND CHEST - 3+ VIEW  Comparison: Plain films of the chest 05/23/2011 and CT chest 10/09/2010.  Findings: Pacing device again noted.  Lungs are clear.  Heart size is normal.  No pneumothorax or pleural fluid.  No rib fractures identified.  Postoperative change lower lumbar spine is noted.  IMPRESSION: No acute finding.  Original Report Authenticated By: Bernadene Bell. D'ALESSIO, M.D.   Dg Cervical Spine Complete  09/09/2011  *RADIOLOGY REPORT*  Clinical Data: Left side neck pain radiating into the left arm and hand.  CERVICAL SPINE - COMPLETE 4+  VIEW  Comparison: None.  Findings: There is loss of disc space height at C4-5 and C5-6, worse at C5-6.  Vertebral body height and alignment are maintained. Uncovertebral disease causes some bilateral foraminal narrowing at C5-6.  Lung apices are clear.  Prevertebral soft tissues appear normal.  IMPRESSION: No acute finding.  Degenerative disease most notable at C5-6.  Original Report Authenticated By: Bernadene Bell. Maricela Curet, M.D.     Date: 09/09/2011  Rate: 79  Rhythm: atrial fibrillation, atrial flutter and paced beats also  QRS Axis: normal  Intervals: normal  ST/T Wave abnormalities: NSSTTC  Conduction Disutrbances:none  Narrative Interpretation:   Old EKG Reviewed: changes noted from 01/20/2002 had only paced rhythm   1. Chest wall pain   2. Arthritis of neck   3. Numbness and tingling in left hand     New Prescriptions   METHOCARBAMOL (ROBAXIN) 750 MG TABLET    Take 1 po every 6-8 hrs for muscles soreness   TRAMADOL (ULTRAM) 50 MG TABLET    Take 1 or 2 po Q 6hrs for pain    Plan discharge  Devoria Albe, MD, Armando Gang   MDM          Ward Givens, MD 09/09/11 [phone]

## 2011-09-09 NOTE — ED Notes (Signed)
Pt states she has had L flank and abd pain for last few months that has gotten worse today.  Also complaining of midsternal chest pain that hurts worse with movement.  Pt states she has also had problems with her equilibrium that has gotten worse today.  Pt states that yesterday she had neck pain that radiating down her L arm.

## 2011-09-11 DIAGNOSIS — Z7901 Long term (current) use of anticoagulants: Secondary | ICD-10-CM | POA: Diagnosis not present

## 2011-09-11 DIAGNOSIS — I4891 Unspecified atrial fibrillation: Secondary | ICD-10-CM | POA: Diagnosis not present

## 2011-09-17 DIAGNOSIS — R109 Unspecified abdominal pain: Secondary | ICD-10-CM | POA: Diagnosis not present

## 2011-09-17 DIAGNOSIS — R079 Chest pain, unspecified: Secondary | ICD-10-CM | POA: Diagnosis not present

## 2011-09-17 DIAGNOSIS — E876 Hypokalemia: Secondary | ICD-10-CM | POA: Diagnosis not present

## 2011-09-28 DIAGNOSIS — M1710 Unilateral primary osteoarthritis, unspecified knee: Secondary | ICD-10-CM | POA: Diagnosis not present

## 2011-09-28 DIAGNOSIS — M545 Low back pain: Secondary | ICD-10-CM | POA: Diagnosis not present

## 2011-09-28 DIAGNOSIS — M25569 Pain in unspecified knee: Secondary | ICD-10-CM | POA: Diagnosis not present

## 2011-10-09 DIAGNOSIS — Z7901 Long term (current) use of anticoagulants: Secondary | ICD-10-CM | POA: Diagnosis not present

## 2011-10-09 DIAGNOSIS — M1710 Unilateral primary osteoarthritis, unspecified knee: Secondary | ICD-10-CM | POA: Diagnosis not present

## 2011-10-09 DIAGNOSIS — I4891 Unspecified atrial fibrillation: Secondary | ICD-10-CM | POA: Diagnosis not present

## 2011-10-16 DIAGNOSIS — M1710 Unilateral primary osteoarthritis, unspecified knee: Secondary | ICD-10-CM | POA: Diagnosis not present

## 2011-10-23 DIAGNOSIS — M1710 Unilateral primary osteoarthritis, unspecified knee: Secondary | ICD-10-CM | POA: Diagnosis not present

## 2011-10-30 DIAGNOSIS — M1710 Unilateral primary osteoarthritis, unspecified knee: Secondary | ICD-10-CM | POA: Diagnosis not present

## 2011-11-05 DIAGNOSIS — M25569 Pain in unspecified knee: Secondary | ICD-10-CM | POA: Diagnosis not present

## 2011-11-05 DIAGNOSIS — E119 Type 2 diabetes mellitus without complications: Secondary | ICD-10-CM | POA: Diagnosis not present

## 2011-11-05 DIAGNOSIS — Z23 Encounter for immunization: Secondary | ICD-10-CM | POA: Diagnosis not present

## 2011-11-05 DIAGNOSIS — I4891 Unspecified atrial fibrillation: Secondary | ICD-10-CM | POA: Diagnosis not present

## 2011-11-05 DIAGNOSIS — J309 Allergic rhinitis, unspecified: Secondary | ICD-10-CM | POA: Diagnosis not present

## 2011-11-05 DIAGNOSIS — R51 Headache: Secondary | ICD-10-CM | POA: Diagnosis not present

## 2011-11-05 DIAGNOSIS — I10 Essential (primary) hypertension: Secondary | ICD-10-CM | POA: Diagnosis not present

## 2011-11-08 DIAGNOSIS — I4891 Unspecified atrial fibrillation: Secondary | ICD-10-CM | POA: Diagnosis not present

## 2011-11-08 DIAGNOSIS — Z7901 Long term (current) use of anticoagulants: Secondary | ICD-10-CM | POA: Diagnosis not present

## 2011-11-14 DIAGNOSIS — H409 Unspecified glaucoma: Secondary | ICD-10-CM | POA: Diagnosis not present

## 2011-11-14 DIAGNOSIS — H4011X0 Primary open-angle glaucoma, stage unspecified: Secondary | ICD-10-CM | POA: Diagnosis not present

## 2011-12-04 DIAGNOSIS — Z7901 Long term (current) use of anticoagulants: Secondary | ICD-10-CM | POA: Diagnosis not present

## 2011-12-04 DIAGNOSIS — I4891 Unspecified atrial fibrillation: Secondary | ICD-10-CM | POA: Diagnosis not present

## 2011-12-11 DIAGNOSIS — M1710 Unilateral primary osteoarthritis, unspecified knee: Secondary | ICD-10-CM | POA: Diagnosis not present

## 2011-12-25 DIAGNOSIS — J40 Bronchitis, not specified as acute or chronic: Secondary | ICD-10-CM | POA: Diagnosis not present

## 2011-12-31 DIAGNOSIS — I4891 Unspecified atrial fibrillation: Secondary | ICD-10-CM | POA: Diagnosis not present

## 2011-12-31 DIAGNOSIS — Z950 Presence of cardiac pacemaker: Secondary | ICD-10-CM | POA: Diagnosis not present

## 2011-12-31 DIAGNOSIS — Z7901 Long term (current) use of anticoagulants: Secondary | ICD-10-CM | POA: Diagnosis not present

## 2012-01-08 DIAGNOSIS — Z7901 Long term (current) use of anticoagulants: Secondary | ICD-10-CM | POA: Diagnosis not present

## 2012-01-08 DIAGNOSIS — I4891 Unspecified atrial fibrillation: Secondary | ICD-10-CM | POA: Diagnosis not present

## 2012-01-09 DIAGNOSIS — M25569 Pain in unspecified knee: Secondary | ICD-10-CM | POA: Diagnosis not present

## 2012-01-14 DIAGNOSIS — I4891 Unspecified atrial fibrillation: Secondary | ICD-10-CM | POA: Diagnosis not present

## 2012-01-14 DIAGNOSIS — I5032 Chronic diastolic (congestive) heart failure: Secondary | ICD-10-CM | POA: Diagnosis not present

## 2012-01-14 DIAGNOSIS — Z7901 Long term (current) use of anticoagulants: Secondary | ICD-10-CM | POA: Diagnosis not present

## 2012-01-14 DIAGNOSIS — Z950 Presence of cardiac pacemaker: Secondary | ICD-10-CM | POA: Diagnosis not present

## 2012-01-14 DIAGNOSIS — I10 Essential (primary) hypertension: Secondary | ICD-10-CM | POA: Diagnosis not present

## 2012-01-14 DIAGNOSIS — I2510 Atherosclerotic heart disease of native coronary artery without angina pectoris: Secondary | ICD-10-CM | POA: Diagnosis not present

## 2012-01-27 DIAGNOSIS — Z950 Presence of cardiac pacemaker: Secondary | ICD-10-CM | POA: Diagnosis not present

## 2012-01-27 DIAGNOSIS — Z7901 Long term (current) use of anticoagulants: Secondary | ICD-10-CM | POA: Diagnosis not present

## 2012-01-27 DIAGNOSIS — I10 Essential (primary) hypertension: Secondary | ICD-10-CM | POA: Diagnosis not present

## 2012-01-27 DIAGNOSIS — I4891 Unspecified atrial fibrillation: Secondary | ICD-10-CM | POA: Diagnosis not present

## 2012-01-27 DIAGNOSIS — I5032 Chronic diastolic (congestive) heart failure: Secondary | ICD-10-CM | POA: Diagnosis not present

## 2012-01-27 DIAGNOSIS — I2510 Atherosclerotic heart disease of native coronary artery without angina pectoris: Secondary | ICD-10-CM | POA: Diagnosis not present

## 2012-02-03 DIAGNOSIS — I4891 Unspecified atrial fibrillation: Secondary | ICD-10-CM | POA: Diagnosis not present

## 2012-02-03 DIAGNOSIS — Z7901 Long term (current) use of anticoagulants: Secondary | ICD-10-CM | POA: Diagnosis not present

## 2012-02-14 DIAGNOSIS — I4891 Unspecified atrial fibrillation: Secondary | ICD-10-CM | POA: Diagnosis not present

## 2012-02-14 DIAGNOSIS — Z7901 Long term (current) use of anticoagulants: Secondary | ICD-10-CM | POA: Diagnosis not present

## 2012-02-20 DIAGNOSIS — Z79899 Other long term (current) drug therapy: Secondary | ICD-10-CM | POA: Diagnosis not present

## 2012-02-20 DIAGNOSIS — I4891 Unspecified atrial fibrillation: Secondary | ICD-10-CM | POA: Diagnosis not present

## 2012-02-20 DIAGNOSIS — I5032 Chronic diastolic (congestive) heart failure: Secondary | ICD-10-CM | POA: Diagnosis not present

## 2012-02-20 DIAGNOSIS — I10 Essential (primary) hypertension: Secondary | ICD-10-CM | POA: Diagnosis not present

## 2012-03-09 DIAGNOSIS — Z7901 Long term (current) use of anticoagulants: Secondary | ICD-10-CM | POA: Diagnosis not present

## 2012-03-09 DIAGNOSIS — I4891 Unspecified atrial fibrillation: Secondary | ICD-10-CM | POA: Diagnosis not present

## 2012-03-11 DIAGNOSIS — R071 Chest pain on breathing: Secondary | ICD-10-CM | POA: Diagnosis not present

## 2012-03-23 DIAGNOSIS — Z7901 Long term (current) use of anticoagulants: Secondary | ICD-10-CM | POA: Diagnosis not present

## 2012-04-06 DIAGNOSIS — I495 Sick sinus syndrome: Secondary | ICD-10-CM | POA: Diagnosis not present

## 2012-04-06 DIAGNOSIS — Z950 Presence of cardiac pacemaker: Secondary | ICD-10-CM | POA: Diagnosis not present

## 2012-04-17 DIAGNOSIS — M1710 Unilateral primary osteoarthritis, unspecified knee: Secondary | ICD-10-CM | POA: Diagnosis not present

## 2012-04-28 DIAGNOSIS — I4891 Unspecified atrial fibrillation: Secondary | ICD-10-CM | POA: Diagnosis not present

## 2012-04-28 DIAGNOSIS — Z7901 Long term (current) use of anticoagulants: Secondary | ICD-10-CM | POA: Diagnosis not present

## 2012-05-12 DIAGNOSIS — Z7901 Long term (current) use of anticoagulants: Secondary | ICD-10-CM | POA: Diagnosis not present

## 2012-05-12 DIAGNOSIS — I495 Sick sinus syndrome: Secondary | ICD-10-CM | POA: Diagnosis not present

## 2012-05-12 DIAGNOSIS — Z950 Presence of cardiac pacemaker: Secondary | ICD-10-CM | POA: Diagnosis not present

## 2012-05-12 DIAGNOSIS — Z79899 Other long term (current) drug therapy: Secondary | ICD-10-CM | POA: Diagnosis not present

## 2012-05-12 DIAGNOSIS — I10 Essential (primary) hypertension: Secondary | ICD-10-CM | POA: Diagnosis not present

## 2012-05-12 DIAGNOSIS — I4891 Unspecified atrial fibrillation: Secondary | ICD-10-CM | POA: Diagnosis not present

## 2012-05-26 DIAGNOSIS — J309 Allergic rhinitis, unspecified: Secondary | ICD-10-CM | POA: Diagnosis not present

## 2012-05-26 DIAGNOSIS — J069 Acute upper respiratory infection, unspecified: Secondary | ICD-10-CM | POA: Diagnosis not present

## 2012-05-27 DIAGNOSIS — M1710 Unilateral primary osteoarthritis, unspecified knee: Secondary | ICD-10-CM | POA: Diagnosis not present

## 2012-05-27 DIAGNOSIS — I4891 Unspecified atrial fibrillation: Secondary | ICD-10-CM | POA: Diagnosis not present

## 2012-05-27 DIAGNOSIS — Z7901 Long term (current) use of anticoagulants: Secondary | ICD-10-CM | POA: Diagnosis not present

## 2012-06-18 DIAGNOSIS — H409 Unspecified glaucoma: Secondary | ICD-10-CM | POA: Diagnosis not present

## 2012-06-18 DIAGNOSIS — H4011X0 Primary open-angle glaucoma, stage unspecified: Secondary | ICD-10-CM | POA: Diagnosis not present

## 2012-06-25 DIAGNOSIS — Z7901 Long term (current) use of anticoagulants: Secondary | ICD-10-CM | POA: Diagnosis not present

## 2012-06-25 DIAGNOSIS — I4891 Unspecified atrial fibrillation: Secondary | ICD-10-CM | POA: Diagnosis not present

## 2012-07-01 ENCOUNTER — Other Ambulatory Visit (HOSPITAL_COMMUNITY): Payer: Self-pay | Admitting: Family Medicine

## 2012-07-01 DIAGNOSIS — Z1231 Encounter for screening mammogram for malignant neoplasm of breast: Secondary | ICD-10-CM

## 2012-07-03 ENCOUNTER — Ambulatory Visit (HOSPITAL_COMMUNITY)
Admission: RE | Admit: 2012-07-03 | Discharge: 2012-07-03 | Disposition: A | Discharge status: Home / Self Care | Payer: Medicare Other | Source: Ambulatory Visit | Attending: Family Medicine | Admitting: Family Medicine

## 2012-07-03 DIAGNOSIS — Z1231 Encounter for screening mammogram for malignant neoplasm of breast: Secondary | ICD-10-CM | POA: Diagnosis not present

## 2012-07-21 DIAGNOSIS — E119 Type 2 diabetes mellitus without complications: Secondary | ICD-10-CM | POA: Diagnosis not present

## 2012-07-21 DIAGNOSIS — M25519 Pain in unspecified shoulder: Secondary | ICD-10-CM | POA: Diagnosis not present

## 2012-07-21 DIAGNOSIS — M79609 Pain in unspecified limb: Secondary | ICD-10-CM | POA: Diagnosis not present

## 2012-07-21 DIAGNOSIS — J329 Chronic sinusitis, unspecified: Secondary | ICD-10-CM | POA: Diagnosis not present

## 2012-07-21 DIAGNOSIS — F411 Generalized anxiety disorder: Secondary | ICD-10-CM | POA: Diagnosis not present

## 2012-07-28 DIAGNOSIS — I4891 Unspecified atrial fibrillation: Secondary | ICD-10-CM | POA: Diagnosis not present

## 2012-07-28 DIAGNOSIS — Z7901 Long term (current) use of anticoagulants: Secondary | ICD-10-CM | POA: Diagnosis not present

## 2012-07-28 DIAGNOSIS — Z950 Presence of cardiac pacemaker: Secondary | ICD-10-CM | POA: Diagnosis not present

## 2012-08-05 DIAGNOSIS — M1710 Unilateral primary osteoarthritis, unspecified knee: Secondary | ICD-10-CM | POA: Diagnosis not present

## 2012-08-05 DIAGNOSIS — H04129 Dry eye syndrome of unspecified lacrimal gland: Secondary | ICD-10-CM | POA: Diagnosis not present

## 2012-08-07 DIAGNOSIS — H15009 Unspecified scleritis, unspecified eye: Secondary | ICD-10-CM | POA: Diagnosis not present

## 2012-08-11 DIAGNOSIS — H409 Unspecified glaucoma: Secondary | ICD-10-CM | POA: Diagnosis not present

## 2012-08-11 DIAGNOSIS — H15009 Unspecified scleritis, unspecified eye: Secondary | ICD-10-CM | POA: Diagnosis not present

## 2012-08-11 DIAGNOSIS — H4011X0 Primary open-angle glaucoma, stage unspecified: Secondary | ICD-10-CM | POA: Diagnosis not present

## 2012-08-25 DIAGNOSIS — H15009 Unspecified scleritis, unspecified eye: Secondary | ICD-10-CM | POA: Diagnosis not present

## 2012-08-28 DIAGNOSIS — E1149 Type 2 diabetes mellitus with other diabetic neurological complication: Secondary | ICD-10-CM | POA: Diagnosis not present

## 2012-08-28 DIAGNOSIS — Q828 Other specified congenital malformations of skin: Secondary | ICD-10-CM | POA: Diagnosis not present

## 2012-08-28 DIAGNOSIS — L608 Other nail disorders: Secondary | ICD-10-CM | POA: Diagnosis not present

## 2012-08-28 DIAGNOSIS — H15009 Unspecified scleritis, unspecified eye: Secondary | ICD-10-CM | POA: Diagnosis not present

## 2012-08-28 DIAGNOSIS — Z7901 Long term (current) use of anticoagulants: Secondary | ICD-10-CM | POA: Diagnosis not present

## 2012-08-28 DIAGNOSIS — I4891 Unspecified atrial fibrillation: Secondary | ICD-10-CM | POA: Diagnosis not present

## 2012-08-30 ENCOUNTER — Observation Stay (HOSPITAL_COMMUNITY)
Admission: EM | Admit: 2012-08-30 | Discharge: 2012-08-31 | Disposition: A | Discharge status: Home / Self Care | Payer: Medicare Other | Attending: Internal Medicine | Admitting: Internal Medicine

## 2012-08-30 ENCOUNTER — Encounter (HOSPITAL_COMMUNITY): Payer: Self-pay | Admitting: *Deleted

## 2012-08-30 ENCOUNTER — Emergency Department (HOSPITAL_COMMUNITY): Payer: Medicare Other

## 2012-08-30 DIAGNOSIS — Z8673 Personal history of transient ischemic attack (TIA), and cerebral infarction without residual deficits: Secondary | ICD-10-CM | POA: Insufficient documentation

## 2012-08-30 DIAGNOSIS — K219 Gastro-esophageal reflux disease without esophagitis: Secondary | ICD-10-CM | POA: Insufficient documentation

## 2012-08-30 DIAGNOSIS — R42 Dizziness and giddiness: Secondary | ICD-10-CM | POA: Diagnosis not present

## 2012-08-30 DIAGNOSIS — I4891 Unspecified atrial fibrillation: Secondary | ICD-10-CM | POA: Insufficient documentation

## 2012-08-30 DIAGNOSIS — I10 Essential (primary) hypertension: Secondary | ICD-10-CM | POA: Insufficient documentation

## 2012-08-30 DIAGNOSIS — Z79899 Other long term (current) drug therapy: Secondary | ICD-10-CM | POA: Insufficient documentation

## 2012-08-30 DIAGNOSIS — E119 Type 2 diabetes mellitus without complications: Secondary | ICD-10-CM | POA: Diagnosis not present

## 2012-08-30 DIAGNOSIS — I480 Paroxysmal atrial fibrillation: Secondary | ICD-10-CM | POA: Diagnosis present

## 2012-08-30 DIAGNOSIS — M109 Gout, unspecified: Secondary | ICD-10-CM | POA: Diagnosis not present

## 2012-08-30 DIAGNOSIS — Z950 Presence of cardiac pacemaker: Secondary | ICD-10-CM | POA: Diagnosis not present

## 2012-08-30 DIAGNOSIS — Z7901 Long term (current) use of anticoagulants: Secondary | ICD-10-CM | POA: Diagnosis not present

## 2012-08-30 LAB — BASIC METABOLIC PANEL
Calcium: 9.4 mg/dL (ref 8.4–10.5)
Creatinine, Ser: 1.15 mg/dL — ABNORMAL HIGH (ref 0.50–1.10)
GFR calc Af Amer: 52 mL/min — ABNORMAL LOW (ref >90)
GFR calc non Af Amer: 45 mL/min — ABNORMAL LOW (ref >90)
Sodium: 136 mEq/L (ref 135–145)

## 2012-08-30 LAB — PROTIME-INR
INR: 2.23 — ABNORMAL HIGH (ref 0.00–1.49)
Prothrombin Time: 24 seconds — ABNORMAL HIGH (ref 11.6–15.2)

## 2012-08-30 LAB — CBC
MCH: 28.5 pg (ref 26.0–34.0)
MCHC: 33.1 g/dL (ref 30.0–36.0)
MCV: 86.1 fL (ref 78.0–100.0)
Platelets: 251 10*3/uL (ref 150–400)
RDW: 14.5 % (ref 11.5–15.5)

## 2012-08-30 MED ORDER — BIMATOPROST 0.01 % OP SOLN
1.0000 [drp] | Freq: Every day | OPHTHALMIC | Status: DC
  Administered 2012-08-30: 1 [drp] via OPHTHALMIC
  Filled 2012-08-30: qty 2.5

## 2012-08-30 MED ORDER — WARFARIN SODIUM 5 MG PO TABS
5.0000 mg | ORAL_TABLET | Freq: Once | ORAL | Status: AC
  Administered 2012-08-30: 5 mg via ORAL
  Filled 2012-08-30: qty 1

## 2012-08-30 MED ORDER — SODIUM CHLORIDE 0.9 % IV SOLN
INTRAVENOUS | Status: DC
  Administered 2012-08-31: 08:00:00 via INTRAVENOUS

## 2012-08-30 MED ORDER — DIAZEPAM 5 MG PO TABS: 5.0000 mg | ORAL_TABLET | Freq: Every day | ORAL | Status: DC | PRN

## 2012-08-30 MED ORDER — IRBESARTAN 150 MG PO TABS
150.0000 mg | ORAL_TABLET | Freq: Every day | ORAL | Status: DC
  Administered 2012-08-31: 150 mg via ORAL
  Filled 2012-08-30: qty 1

## 2012-08-30 MED ORDER — CALCIUM CARBONATE 1250 (500 CA) MG PO TABS
500.0000 mg | ORAL_TABLET | Freq: Every day | ORAL | Status: DC
  Administered 2012-08-30 – 2012-08-31 (×2): 500 mg via ORAL
  Filled 2012-08-30 (×2): qty 1

## 2012-08-30 MED ORDER — POTASSIUM CHLORIDE CRYS ER 20 MEQ PO TBCR
20.0000 meq | EXTENDED_RELEASE_TABLET | Freq: Every day | ORAL | Status: DC
  Administered 2012-08-30 – 2012-08-31 (×2): 20 meq via ORAL
  Filled 2012-08-30 (×2): qty 1

## 2012-08-30 MED ORDER — SENNA 8.6 MG PO TABS
2.0000 | ORAL_TABLET | Freq: Every day | ORAL | Status: DC
  Administered 2012-08-30: 17.2 mg via ORAL
  Filled 2012-08-30: qty 1
  Filled 2012-08-30: qty 2
  Filled 2012-08-30: qty 1

## 2012-08-30 MED ORDER — DIFLUPREDNATE 0.05 % OP EMUL: 1.0000 [drp] | Freq: Three times a day (TID) | OPHTHALMIC | Status: DC

## 2012-08-30 MED ORDER — ACETAMINOPHEN 500 MG PO TABS: 1000.0000 mg | ORAL_TABLET | Freq: Four times a day (QID) | ORAL | Status: DC | PRN

## 2012-08-30 MED ORDER — ADULT MULTIVITAMIN W/MINERALS CH
1.0000 | ORAL_TABLET | Freq: Every day | ORAL | Status: DC
  Administered 2012-08-30 – 2012-08-31 (×2): 1 via ORAL
  Filled 2012-08-30 (×2): qty 1

## 2012-08-30 MED ORDER — WARFARIN - PHARMACIST DOSING INPATIENT: Freq: Every day | Status: DC

## 2012-08-30 MED ORDER — PANTOPRAZOLE SODIUM 40 MG PO TBEC
40.0000 mg | DELAYED_RELEASE_TABLET | Freq: Every day | ORAL | Status: DC
  Administered 2012-08-30 – 2012-08-31 (×2): 40 mg via ORAL
  Filled 2012-08-30 (×2): qty 1

## 2012-08-30 MED ORDER — VITAMIN C 500 MG PO TABS
500.0000 mg | ORAL_TABLET | Freq: Every day | ORAL | Status: DC
  Administered 2012-08-30 – 2012-08-31 (×2): 500 mg via ORAL
  Filled 2012-08-30 (×2): qty 1

## 2012-08-30 MED ORDER — AMIODARONE HCL 200 MG PO TABS
200.0000 mg | ORAL_TABLET | Freq: Every day | ORAL | Status: DC
  Administered 2012-08-30 – 2012-08-31 (×2): 200 mg via ORAL
  Filled 2012-08-30 (×2): qty 1

## 2012-08-30 MED ORDER — AMLODIPINE BESYLATE 5 MG PO TABS
5.0000 mg | ORAL_TABLET | Freq: Every day | ORAL | Status: DC
  Administered 2012-08-30 – 2012-08-31 (×2): 5 mg via ORAL
  Filled 2012-08-30 (×2): qty 1

## 2012-08-30 MED ORDER — MECLIZINE HCL 12.5 MG PO TABS
12.5000 mg | ORAL_TABLET | Freq: Two times a day (BID) | ORAL | Status: DC
  Administered 2012-08-30 – 2012-08-31 (×2): 12.5 mg via ORAL
  Filled 2012-08-30 (×3): qty 1

## 2012-08-30 MED ORDER — ONDANSETRON HCL 4 MG/2ML IJ SOLN: 4.0000 mg | Freq: Four times a day (QID) | INTRAMUSCULAR | Status: DC | PRN

## 2012-08-30 NOTE — Progress Notes (Signed)
ANTICOAGULATION CONSULT NOTE - Initial Consult  Pharmacy Consult for Coumadin Indication: atrial fibrillation  Allergies  Allergen Reactions  . Prednisone Palpitations    tachycardia    Patient Measurements: Height: 5\' 2"  (157.5 cm) Weight: 196 lb (88.905 kg) IBW/kg (Calculated) : 50.1  Labs:  Recent Labs  08/30/12 1627 08/30/12 1925  HGB 13.5  --   HCT 40.8  --   PLT 251  --   LABPROT  --  24.0*  INR  --  2.23*  CREATININE 1.15*  --     Estimated Creatinine Clearance: 42.4 ml/min (by C-G formula based on Cr of 1.15).   Medical History: Past Medical History  Diagnosis Date  . Hypertension     medication controled  . Angina   . Heart murmur   . Dysrhythmia   . Recurrent upper respiratory infection (URI)     one month ago  . Stroke   . Pneumonia     childhood  . GERD (gastroesophageal reflux disease)   . Anxiety   . Depression   . Arthritis   . Diabetes mellitus   . Tuberculosis     during childhood unsure of time  . Gout      Assessment: 79 yof on Coumadin PTA for h/o atrial fibrillation presented 8/3 with dizziness, hypotension. MD order to resume Coumadin while inpatient. INR this afternoon = 2.23. Patient was reportedly on Coumadin 5mg  daily with last dose taken on 8/2.  Patient was on Amiodarone 200mg  daily, which was resumed inpatient.  CBC wnl. No bleeding  Goal of Therapy:  INR 2-3 Monitor platelets by anticoagulation protocol: Yes   Plan:   Coumadin 5mg  po x 1 tonight as per PTA  Daily PT/INR  Pharmacy will f/u  Geoffry Paradise, PharmD, BCPS Pager: [phone] 8:02 PM Pharmacy #: [id number]

## 2012-08-30 NOTE — ED Notes (Signed)
Patient transported to CT 

## 2012-08-30 NOTE — ED Provider Notes (Signed)
CSN: [id number]     Arrival date & time 08/30/12  1433 History     First MD Initiated Contact with Patient 08/30/12 1532     Chief Complaint  Patient presents with  . Dizziness  . Hypertension   (Consider location/radiation/quality/duration/timing/severity/associated sxs/prior Treatment) Patient is a 79 y.o. female presenting with hypertension and neurologic complaint. The history is provided by the patient.  Hypertension This is a chronic problem. Pertinent negatives include no chest pain, no abdominal pain, no headaches and no shortness of breath.  Neurologic Problem This is a new problem. The current episode started more than 1 week ago. The problem occurs daily. The problem has been rapidly worsening. Pertinent negatives include no chest pain, no abdominal pain, no headaches and no shortness of breath. Nothing aggravates the symptoms. Nothing relieves the symptoms. She has tried nothing for the symptoms. The treatment provided no relief.    Past Medical History  Diagnosis Date  . Hypertension     medication controled  . Angina   . Heart murmur   . Dysrhythmia   . Recurrent upper respiratory infection (URI)     one month ago  . Stroke   . Pneumonia     childhood  . GERD (gastroesophageal reflux disease)   . Anxiety   . Depression   . Arthritis   . Diabetes mellitus   . Tuberculosis     during childhood unsure of time  . Gout    Past Surgical History  Procedure Laterality Date  . Tonsillectomy    . Tubal ligation    . Abdominal hysterectomy    . Pacemaker insertion      2008  . Back surgery      2010  . Cardiac catheterization     Family History  Problem Relation Age of Onset  . Malignant hyperthermia Neg Hx    History  Substance Use Topics  . Smoking status: Never Smoker   . Smokeless tobacco: Never Used  . Alcohol Use: Yes     Comment: rarely   OB History   Grav Para Term Preterm Abortions TAB SAB Ect Mult Living                 Review of Systems   Constitutional: Negative for fever and chills.  Respiratory: Negative for shortness of breath.   Cardiovascular: Negative for chest pain.  Gastrointestinal: Negative for abdominal pain.  Neurological: Negative for headaches.  All other systems reviewed and are negative.    Allergies  Prednisone  Home Medications   Current Outpatient Rx  Name  Route  Sig  Dispense  Refill  . acetaminophen (TYLENOL) 500 MG tablet   Oral   Take 1,000 mg by mouth every 6 (six) hours as needed for pain.          Marland Kitchen amiodarone (PACERONE) 200 MG tablet   Oral   Take 200 mg by mouth daily.         Marland Kitchen amLODipine (NORVASC) 5 MG tablet   Oral   Take 5 mg by mouth daily.         Marland Kitchen b complex vitamins tablet   Oral   Take 1 tablet by mouth daily.         . beta carotene 40981 UNIT capsule   Oral   Take 25,000 Units by mouth 3 (three) times a week.          . bimatoprost (LUMIGAN) 0.01 % SOLN   Both Eyes  Place 1 drop into both eyes at bedtime.         . calcium carbonate (OS-CAL) 600 MG TABS   Oral   Take 600 mg by mouth daily.          . Cinnamon 500 MG TABS   Oral   Take 2 tablets by mouth daily as needed (for blood sugar).          . colchicine 0.6 MG tablet   Oral   Take 0.6 mg by mouth daily as needed (for flareups).          Marland Kitchen dexlansoprazole (DEXILANT) 60 MG capsule   Oral   Take 60 mg by mouth daily.          . diazepam (VALIUM) 5 MG tablet   Oral   Take 5 mg by mouth daily as needed. Anxiety.         . Difluprednate (DUREZOL) 0.05 % EMUL   Right Eye   Place 1 drop into the right eye 3 (three) times daily.         . Multiple Vitamin (MULTIVITAMIN WITH MINERALS) TABS   Oral   Take 1 tablet by mouth daily.         Marland Kitchen nystatin (MYCOSTATIN) 100000 UNIT/ML suspension   Oral   Take 500,000 Units by mouth daily as needed (for mouth irritation).          Marland Kitchen OVER THE COUNTER MEDICATION   Topical   Apply 1 application topically 2 (two) times daily.  "Formula 3", OTC medication for toenail fungus.         . potassium chloride SA (K-DUR,KLOR-CON) 20 MEQ tablet   Oral   Take 20 mEq by mouth daily.         Marland Kitchen senna (SENOKOT) 8.6 MG tablet   Oral   Take 2 tablets by mouth at bedtime.          . triamterene-hydrochlorothiazide (MAXZIDE) 75-50 MG per tablet   Oral   Take 1 tablet by mouth at bedtime.          . valsartan (DIOVAN) 320 MG tablet   Oral   Take 320 mg by mouth daily.         . vitamin C (ASCORBIC ACID) 500 MG tablet   Oral   Take 500 mg by mouth daily.         Marland Kitchen warfarin (COUMADIN) 5 MG tablet   Oral   Take 5 mg by mouth daily.          BP 148/71  Pulse 78  Temp(Src) 98.3 F (36.8 C) (Oral)  Resp 16  SpO2 97% Physical Exam  Nursing note and vitals reviewed. Constitutional: She is oriented to person, place, and time. She appears well-developed and well-nourished. No distress.  HENT:  Head: Normocephalic and atraumatic.  Eyes: EOM are normal. Pupils are equal, round, and reactive to light.  Neck: Normal range of motion. Neck supple.  Cardiovascular: Normal rate and regular rhythm.  Exam reveals no friction rub.   No murmur heard. Pulmonary/Chest: Effort normal and breath sounds normal. No respiratory distress. She has no wheezes. She has no rales.  Abdominal: Soft. She exhibits no distension. There is no tenderness. There is no rebound.  Musculoskeletal: Normal range of motion. She exhibits no edema.  Neurological: She is alert and oriented to person, place, and time. No cranial nerve deficit. She exhibits normal muscle tone. Coordination and gait normal. GCS eye subscore is 4. GCS  verbal subscore is 5. GCS motor subscore is 6.  Skin: Skin is warm. She is not diaphoretic.    ED Course   Procedures (including critical care time)  Labs Reviewed  CBC  BASIC METABOLIC PANEL   No results found. 1. Dizziness      Date: 08/30/2012  Rate: 71  Rhythm: paced  QRS Axis: normal  Intervals:  normal  ST/T Wave abnormalities: normal  Conduction Disutrbances:none  Narrative Interpretation:   Old EKG Reviewed: ischemic changes on previous, none today   MDM   79 year old female with hx of TIA (couldn't recall when), HTN, DM, Heart disease presents with dizziness. States on/off for past month, very bad this morning. BP at that time 216/116. Patient called her PCP who instructed her to come to the ED. Patient states occasionally being off-balance, however normal gait here. Normal strength, sensation, cranial nerves, visual fields.  Labs normal, Head CT normal. Unable to obtain MRI due to pacer. Admitted to hospitalist in observation status due to her prolonged dizziness.   Dagmar Hait, MD 08/30/12 [phone]

## 2012-08-30 NOTE — ED Notes (Signed)
Pt states this morning started feeling dizzy, states took home BP and was 216/116, called PCP and was told to come here, has been taking BP medication.

## 2012-08-30 NOTE — ED Notes (Signed)
Spoke with pt regarding importance of not eating or drinking anything per MD order.

## 2012-08-30 NOTE — H&P (Addendum)
[facility] History and Physical  [name] ZOX:[id number] DOB: [birth year] DOA: 08/30/2012  Referring physician:EDP PCP: Johny Blamer, MD  Specialists:cardiology Dr.Smith  Chief Complaint: dizziness  HPI: [name] is a 79 y.o. female with PMH of HTN, P.afib on coumadin, pacemaker, TIA presents to the ER with the above complaint. Pt reports intermittent dizziness/lightheadedness, off and on for 2 months, this is noticed when she is either standing or ambulatory, never at rest, associated with balance problems, momentary and self limiting. This morning she woke up, took a shower and got dressed and ready for church, she became extremely lightheaded, more than usual and lasted longer, she checked her BP and this was 216/116, called her PCPs office and was asked to come to the ER. She denies any N/V/DIarrhea, no chest pain, SoB or palpitations. No changes in her medications recently.     Review of Systems: The patient denies anorexia, fever, weight loss,, vision loss, decreased hearing, hoarseness, chest pain, syncope, dyspnea on exertion, peripheral edema, balance deficits, hemoptysis, abdominal pain, melena, hematochezia, severe indigestion/heartburn, hematuria, incontinence, genital sores, muscle weakness, suspicious skin lesions, transient blindness, difficulty walking, depression, unusual weight change, abnormal bleeding, enlarged lymph nodes, angioedema, and breast masses.    Past Medical History  Diagnosis Date  . Hypertension     medication controled  . Angina   . Heart murmur   . Dysrhythmia   . Recurrent upper respiratory infection (URI)     one month ago  . Stroke   . Pneumonia     childhood  . GERD (gastroesophageal reflux disease)   . Anxiety   . Depression   . Arthritis   . Diabetes mellitus   . Tuberculosis     during childhood unsure of time  . Gout    Past Surgical History  Procedure Laterality Date  . Tonsillectomy    . Tubal  ligation    . Abdominal hysterectomy    . Pacemaker insertion      2008  . Back surgery      2010  . Cardiac catheterization     Social History:  reports that she has never smoked. She has never used smokeless tobacco. She reports that  drinks alcohol. She reports that she does not use illicit drugs. Lives at home with daughter  Allergies  Allergen Reactions  . Prednisone Palpitations    tachycardia    Family History  Problem Relation Age of Onset  . Malignant hyperthermia Neg Hx     Prior to Admission medications   Medication Sig Start Date End Date Taking? Authorizing Provider  acetaminophen (TYLENOL) 500 MG tablet Take 1,000 mg by mouth every 6 (six) hours as needed for pain.    Yes Historical Provider, MD  amiodarone (PACERONE) 200 MG tablet Take 200 mg by mouth daily.   Yes Historical Provider, MD  amLODipine (NORVASC) 5 MG tablet Take 5 mg by mouth daily.   Yes Historical Provider, MD  b complex vitamins tablet Take 1 tablet by mouth daily.   Yes Historical Provider, MD  beta carotene 81191 UNIT capsule Take 25,000 Units by mouth 3 (three) times a week.    Yes Historical Provider, MD  bimatoprost (LUMIGAN) 0.01 % SOLN Place 1 drop into both eyes at bedtime.   Yes Historical Provider, MD  calcium carbonate (OS-CAL) 600 MG TABS Take 600 mg by mouth daily.    Yes Historical Provider, MD  Cinnamon 500 MG TABS Take 2 tablets by mouth daily as needed (  for blood sugar).    Yes Historical Provider, MD  colchicine 0.6 MG tablet Take 0.6 mg by mouth daily as needed (for flareups).    Yes Historical Provider, MD  dexlansoprazole (DEXILANT) 60 MG capsule Take 60 mg by mouth daily.    Yes Historical Provider, MD  diazepam (VALIUM) 5 MG tablet Take 5 mg by mouth daily as needed. Anxiety.   Yes Historical Provider, MD  Difluprednate (DUREZOL) 0.05 % EMUL Place 1 drop into the right eye 3 (three) times daily.   Yes Historical Provider, MD  Multiple Vitamin (MULTIVITAMIN WITH MINERALS) TABS  Take 1 tablet by mouth daily.   Yes Historical Provider, MD  nystatin (MYCOSTATIN) 100000 UNIT/ML suspension Take 500,000 Units by mouth daily as needed (for mouth irritation).    Yes Historical Provider, MD  OVER THE COUNTER MEDICATION Apply 1 application topically 2 (two) times daily. "Formula 3", OTC medication for toenail fungus.   Yes Historical Provider, MD  potassium chloride SA (K-DUR,KLOR-CON) 20 MEQ tablet Take 20 mEq by mouth daily.   Yes Historical Provider, MD  senna (SENOKOT) 8.6 MG tablet Take 2 tablets by mouth at bedtime.    Yes Historical Provider, MD  triamterene-hydrochlorothiazide (MAXZIDE) 75-50 MG per tablet Take 1 tablet by mouth at bedtime.    Yes Historical Provider, MD  valsartan (DIOVAN) 320 MG tablet Take 320 mg by mouth daily.   Yes Historical Provider, MD  vitamin C (ASCORBIC ACID) 500 MG tablet Take 500 mg by mouth daily.   Yes Historical Provider, MD  warfarin (COUMADIN) 5 MG tablet Take 5 mg by mouth daily.   Yes Historical Provider, MD   Physical Exam: Filed Vitals:   08/30/12 1443 08/30/12 1747 08/30/12 1747 08/30/12 1749  BP: 148/71 152/74 167/83 166/81  Pulse: 78 74 73 72  Temp: 98.3 F (36.8 C)     TempSrc: Oral     Resp: 16     SpO2: 97%        General:  AAOx3, no distress  HEENT: PERRLA, EOMI, slight horizontal gaze nystagmus  Cardiovascular: S1S2/RRR  Respiratory: CTAB  Abdomen: soft, Nt, BS present  Skin: no rashes or skin breakdown  Musculoskeletal: no edema c/c  Psychiatric: appropriate mood and affect  Neurologic: mild horizontal nystagmus  Motor 5/5, sensory light touch intact  Reflexes 2plus B/l  Plantars mute  Coordination: finger nose intact  Labs on Admission:  Basic Metabolic Panel:  Recent Labs Lab 08/30/12 1627  NA 136  K 3.6  CL 96  CO2 34*  GLUCOSE 110*  BUN 16  CREATININE 1.15*  CALCIUM 9.4   Liver Function Tests: No results found for this basename: AST, ALT, ALKPHOS, BILITOT, PROT, ALBUMIN,  in  the last 168 hours No results found for this basename: LIPASE, AMYLASE,  in the last 168 hours No results found for this basename: AMMONIA,  in the last 168 hours CBC:  Recent Labs Lab 08/30/12 1627  WBC 5.7  HGB 13.5  HCT 40.8  MCV 86.1  PLT 251   Cardiac Enzymes: No results found for this basename: CKTOTAL, CKMB, CKMBINDEX, TROPONINI,  in the last 168 hours  BNP (last 3 results) No results found for this basename: PROBNP,  in the last 8760 hours CBG: No results found for this basename: GLUCAP,  in the last 168 hours  Radiological Exams on Admission: Ct Head Wo Contrast  08/30/2012   *RADIOLOGY REPORT*  Clinical Data: Dizziness and hypertension.  CT HEAD WITHOUT CONTRAST  Technique:  Contiguous axial images were obtained from the base of the skull through the vertex without contrast.  Comparison: CT head without contrast 11/18/2008.  Findings: There is no significant change in mild atrophy and white matter hypoattenuation.  No acute cortical infarct, hemorrhage, or mass lesion is present.  The ventricles are of normal size.  No significant extra-axial fluid collection is present.  The paranasal sinuses and mastoid air cells are clear.  IMPRESSION:  1.  Similar appearance of mild generalized atrophy and white matter disease.  This likely reflects the sequelae of chronic microvascular ischemia. 2.  No acute cardiopulmonary disease.   Original Report Authenticated By: Marin Roberts, M.D.    EKG: Independently reviewed. Paced, no acute St t wave changes  Assessment/Plan  1. Dizziness acute on chronic  -etiology unclear -check Orthostatics, TSH -monitor on tele -check 2D ECHO -unable to check MRI due to pacer, could repeat CT in 24-36hours -trial of meclizine -PT for vestibular eval -hold HCTZ, gentle IVF -will request pacemaker interrogation  2. P.afib -rate controlled -continue amiodarone and coumadin  3. HTN: -stable now -continue amlodipine and ARB -hold  Triamterene HCTZ for now  4. GERD -continue PPR  DVT proph: on anticoagulation with coumadin  Code Status: Full Code Family Communication: d/w pt and daughter at bedside Disposition Plan: observation  Time spent:  [facility] Pager [phone]  If 7PM-7AM, please contact night-coverage [email or web address] Password [facility] 08/30/2012, 5:53 PM

## 2012-08-30 NOTE — ED Notes (Signed)
Abby, RN transporting pt on telemetry to room assignment.

## 2012-08-31 DIAGNOSIS — I4891 Unspecified atrial fibrillation: Secondary | ICD-10-CM | POA: Diagnosis not present

## 2012-08-31 DIAGNOSIS — R42 Dizziness and giddiness: Secondary | ICD-10-CM | POA: Diagnosis not present

## 2012-08-31 DIAGNOSIS — I10 Essential (primary) hypertension: Secondary | ICD-10-CM | POA: Diagnosis not present

## 2012-08-31 LAB — CBC
Hemoglobin: 13.3 g/dL (ref 12.0–15.0)
MCV: 86.6 fL (ref 78.0–100.0)
Platelets: 249 10*3/uL (ref 150–400)
RBC: 4.71 MIL/uL (ref 3.87–5.11)
WBC: 5.2 10*3/uL (ref 4.0–10.5)

## 2012-08-31 LAB — PROTIME-INR: INR: 2.2 — ABNORMAL HIGH (ref 0.00–1.49)

## 2012-08-31 LAB — BASIC METABOLIC PANEL
CO2: 32 mEq/L (ref 19–32)
Chloride: 100 mEq/L (ref 96–112)
Sodium: 139 mEq/L (ref 135–145)

## 2012-08-31 MED ORDER — WARFARIN SODIUM 5 MG PO TABS
5.0000 mg | ORAL_TABLET | Freq: Once | ORAL | Status: DC
  Filled 2012-08-31: qty 1

## 2012-08-31 NOTE — Progress Notes (Signed)
*  PRELIMINARY RESULTS* Echocardiogram 2D Echocardiogram has been performed.  [name] 08/31/2012, 11:30 AM

## 2012-08-31 NOTE — Evaluation (Addendum)
Occupational Therapy Evaluation/Vestibular Eval Patient Details Name: [name] MRN: [id number] DOB: 1932/05/26 Today's Date: 08/31/2012 Time: 4098-1191 OT Time Calculation (min): 28 min  OT Assessment / Plan / Recommendation History of present illness pt was admitted with paroxysmal A-Fib.  She c/o intermittent dizziness when standing or walking on and off for 2 months.     Clinical Impression   Pt was admitted for the above.  She has difficulty explaining her dizziness--mostly lightheaded but had a little spinning.  She doesn't think it occurs every day, but cannot say how often.  She definitely feels that she has had some near falls.  Pt did not experience dysequilibrium with me and reported a little dizziness.  I could not identify a cause with vestibular eval, but VOR and head thrust tests were limited as pt held neck stiffly when I attempted to move them.  Gave her resources for  Neuro rehab, if problems persist.      OT Assessment  All further OT needs can be met in the next venue of care (can pursue OP vestibular/balance programs if symptoms persist)    Follow Up Recommendations   (OP PT if issues do not resolve)    Barriers to Discharge      Equipment Recommendations       Recommendations for Other Services    Frequency       Precautions / Restrictions Precautions Precautions: Fall Restrictions Weight Bearing Restrictions: No   Pertinent Vitals/Pain Has little headache across forehead 1-2/10 Sitting BP 129/67 standing 129/69    ADL  Toilet Transfer: Supervision/safety Toilet Transfer Method: Sit to stand Toilet Transfer Equipment: Comfort height toilet Transfers/Ambulation Related to ADLs: ambulated around room, reaching to retrieve items and to bathroom.  No dizziness experienced during eval.   ADL Comments: Pt describes dizziness as lightheaded and maybe a little spinning. She has a frontal HA and L eye has been inflammed/irritated.  Pt has a h/o glaucoma.   Pt said she feels a little dizzy when looking down.  Performed modified [name]; test was negative.  Pt guarded head and I attempted VOR/Head Thrust.  Test limited as I couldn't move her much.  She reported no dizziness with this.  Gaze holding nystagmus negative and occulomotor, smooth pursuits were wfls.  Gave pt information about Neuro Rehab vestibular/balance programs if she continues to have difficulty.  Discussed modifications at home to help keep her safe:  seat for shower (and to sponge bathe while daughter is away), using reacher to retrieve items that are placed low.     OT Diagnosis:    OT Problem List:   OT Treatment Interventions:     OT Goals(Current goals can be found in the care plan section) Acute Rehab OT Goals Patient Stated Goal: get better, get rid of dizziness, get back to being active  Visit Information  Last OT Received On: 08/31/12 Assistance Needed: +1 History of Present Illness: pt was admitted with paroxysmal A-Fib.  She c/o intermittent dizziness when standing or walking on and off for 2 months.         Prior Functioning     Home Living Family/patient expects to be discharged to:: Private residence Living Arrangements: Spouse/significant other Available Help at Discharge:  (will be gone for 2 days; usually there 24/7) Prior Function Level of Independence: Independent Communication Communication: No difficulties         Vision/Perception Vision - History Baseline Vision: Wears glasses only for reading Visual History: Glaucoma (eye drops  currently for R inflammed eye)   Cognition  Cognition Arousal/Alertness: Awake/alert Behavior During Therapy: WFL for tasks assessed/performed Overall Cognitive Status:  (a little difficulty with recall of dizziness history)    Extremity/Trunk Assessment Upper Extremity Assessment Upper Extremity Assessment: RUE deficits/detail RUE Deficits / Details: able to lift to 90.  Pt states she has a rotator cuff  problem Cervical / Trunk Assessment Cervical / Trunk Assessment: Normal (h/o back surgery/actively moves neck but guards against ther)     Mobility Bed Mobility Bed Mobility: Supine to Sit Supine to Sit: 6: Modified independent (Device/Increase time);HOB elevated Transfers Transfers: Sit to Stand Sit to Stand: 6: Modified independent (Device/Increase time)     Exercise     Balance Balance Balance Assessed: Yes Static Standing Balance Static Standing - Balance Support: No upper extremity supported Static Standing - Level of Assistance: 7: Independent Dynamic Standing Balance Dynamic Standing - Balance Support: No upper extremity supported Dynamic Standing - Level of Assistance: 6: Modified independent (Device/Increase time) to supervision   End of Session OT - End of Session Activity Tolerance: Patient tolerated treatment well Patient left: in bed;with call bell/phone within reach  GO Functional Assessment Tool Used: clinical judgement/observation Functional Limitation: Self care Self Care Current Status (Z6109): At least 1 percent but less than 20 percent impaired, limited or restricted Self Care Goal Status (U0454): At least 1 percent but less than 20 percent impaired, limited or restricted Self Care Discharge Status [phone]): At least 1 percent but less than 20 percent impaired, limited or restricted   [name] 08/31/2012, 12:04 PM [name], OTR/L [phone] 08/31/2012

## 2012-08-31 NOTE — Evaluation (Signed)
Physical Therapy Evaluation Patient Details Name: [name] MRN: [id number] DOB: 07-29-1932 Today's Date: 08/31/2012 Time: 4098-1191 PT Time Calculation (min): 14 min  PT Assessment / Plan / Recommendation History of Present Illness  pt was admitted with paroxysmal A-Fib.  She c/o intermittent dizziness when standing or walking on and off for 2 months.    Clinical Impression  Pt presented with no balance loss and no c/o dizziness.    PT Assessment  Patent does not need any further PT services    Follow Up Recommendations  No PT follow up    Does the patient have the potential to tolerate intense rehabilitation      Barriers to Discharge        Equipment Recommendations  None recommended by PT    Recommendations for Other Services     Frequency      Precautions / Restrictions Precautions Precautions: Fall Restrictions Weight Bearing Restrictions: No   Pertinent Vitals/Pain No pain      Mobility  Bed Mobility Bed Mobility: Supine to Sit Supine to Sit: 7: Independent;HOB flat Transfers Sit to Stand: 6: Modified independent (Device/Increase time) Ambulation/Gait Ambulation/Gait Assistance: 7: Independent Ambulation Distance (Feet): 400 Feet Assistive device: None Ambulation/Gait Assistance Details: pt exhibited no loss of  balance, able to turn head and talk and walk. Gait Pattern: Step-through pattern Gait velocity: wfl Stairs: Yes Stairs Assistance: 6: Modified independent (Device/Increase time) Stairs Assistance Details (indicate cue type and reason): simulated alternating step up to step, no balance loss.Did not actually step up onto "step"--used trash can Stair Management Technique: One rail Left Number of Stairs: 2    Exercises     PT Diagnosis:    PT Problem List:   PT Treatment Interventions:       PT Goals(Current goals can be found in the care plan section) Acute Rehab PT Goals Patient Stated Goal: get better, get rid of dizziness, get  back to being active  Visit Information  Last PT Received On: 08/31/12 Assistance Needed: +1 History of Present Illness: pt was admitted with paroxysmal A-Fib.  She c/o intermittent dizziness when standing or walking on and off for 2 months.         Prior Functioning  Home Living Family/patient expects to be discharged to:: Private residence Living Arrangements: Spouse/significant other Available Help at Discharge:  (will be gone for 2 days; usually there 24/7) Prior Function Level of Independence: Independent Communication Communication: No difficulties    Cognition  Cognition Arousal/Alertness: Awake/alert Behavior During Therapy: WFL for tasks assessed/performed Overall Cognitive Status:  (a little difficulty with recall of dizziness history)    Extremity/Trunk Assessment Upper Extremity Assessment Upper Extremity Assessment: RUE deficits/detail RUE Deficits / Details: able to lift to 90.  Pt states she has a rotator cuff problem Lower Extremity Assessment Lower Extremity Assessment: Overall WFL for tasks assessed Cervical / Trunk Assessment Cervical / Trunk Assessment: Normal (h/o back surgery/actively moves neck but guards against ther)   Balance Balance Balance Assessed: Yes Static Standing Balance Static Standing - Balance Support: No upper extremity supported Static Standing - Level of Assistance: 7: Independent Dynamic Standing Balance Dynamic Standing - Balance Support: No upper extremity supported Dynamic Standing - Level of Assistance: 6: Modified independent (Device/Increase time) Dynamic Standing - Balance Activities: Reaching for objects Dynamic Standing - Comments: able pick up object from floor, turn 360degrees both ways no dizziness. no loss of balance.  End of Session PT - End of Session Equipment Utilized During Treatment: Gait  belt Activity Tolerance: Patient tolerated treatment well Patient left:  (up in bathroom) Nurse Communication: Mobility status   GP Functional Assessment Tool Used: clinical judgement Functional Limitation: Mobility: Walking and moving around Mobility: Walking and Moving Around Current Status (W0981): 0 percent impaired, limited or restricted Mobility: Walking and Moving Around Goal Status (X9147): 0 percent impaired, limited or restricted Mobility: Walking and Moving Around Discharge Status [phone]): 0 percent impaired, limited or restricted   Rada Hay 08/31/2012, 12:40 PM  Blanchard Kelch PT [phone]

## 2012-08-31 NOTE — Progress Notes (Signed)
ANTICOAGULATION CONSULT NOTE - Follow up Consult  Pharmacy Consult for Warfarin Indication: atrial fibrillation  Allergies  Allergen Reactions  . Prednisone Palpitations    tachycardia    Patient Measurements: Height: 5\' 2"  (157.5 cm) Weight: 196 lb (88.905 kg) IBW/kg (Calculated) : 50.1  Labs:  Recent Labs  08/30/12 1627 08/30/12 1925 08/31/12 0455  HGB 13.5  --  13.3  HCT 40.8  --  40.8  PLT 251  --  249  LABPROT  --  24.0* 23.7*  INR  --  2.23* 2.20*  CREATININE 1.15*  --  0.93    Estimated Creatinine Clearance: 52.5 ml/min (by C-G formula based on Cr of 0.93).   Medications: . amiodarone  200 mg Oral Daily  . amLODipine  5 mg Oral Daily  . bimatoprost  1 drop Both Eyes QHS  . calcium carbonate  500 mg of elemental calcium Oral Daily  . Difluprednate  1 drop Right Eye TID  . irbesartan  150 mg Oral Daily  . meclizine  12.5 mg Oral BID  . multivitamin with minerals  1 tablet Oral Daily  . pantoprazole  40 mg Oral Daily  . potassium chloride SA  20 mEq Oral Daily  . senna  2 tablet Oral QHS  . vitamin C  500 mg Oral Daily  . Warfarin - Pharmacist Dosing Inpatient   Does not apply q1800    Assessment: 79 yof on warfarin PTA for h/o atrial fibrillation presented 8/3 with dizziness, hypotension. MD order to resume warfarin per pharmacy dosing while inpatient. INR on admission is therapeutic at 2.23.   Home warfarin dose was 5mg  daily with last dose taken on 8/2.  Patient was on Amiodarone 200mg  daily, which was resumed inpatient.    INR (2.2) remains therapeutic  Hgb is stable, no bleeding or complications reported.  Goal of Therapy:  INR 2-3 Monitor platelets by anticoagulation protocol: Yes   Plan:   Warfarin 5mg  PO tonight.  Daily PT/INR  Pharmacy will f/u daily.  Lynann Beaver PharmD, BCPS Pager [phone] 08/31/2012 8:36 AM

## 2012-09-02 DIAGNOSIS — H15009 Unspecified scleritis, unspecified eye: Secondary | ICD-10-CM | POA: Diagnosis not present

## 2012-09-07 DIAGNOSIS — Z23 Encounter for immunization: Secondary | ICD-10-CM | POA: Diagnosis not present

## 2012-09-07 DIAGNOSIS — I10 Essential (primary) hypertension: Secondary | ICD-10-CM | POA: Diagnosis not present

## 2012-09-07 DIAGNOSIS — M549 Dorsalgia, unspecified: Secondary | ICD-10-CM | POA: Diagnosis not present

## 2012-09-08 DIAGNOSIS — H15009 Unspecified scleritis, unspecified eye: Secondary | ICD-10-CM | POA: Diagnosis not present

## 2012-09-23 NOTE — Discharge Summary (Signed)
Physician Discharge Summary  CORRISSA MARTELLO ONG:[id number] DOB: 12/10/1932 DOA: 08/30/2012  PCP: Johny Blamer, MD  Admit date: 08/30/2012 Discharge date: 08/31/2012  Time spent: 45 minutes  Recommendations for Outpatient Follow-up:  1. Dr.Smith in 7-10days  Discharge Diagnoses:  Active Problems:   Paroxysmal a-fib   HTN (hypertension)   Gout   Dizziness   Pacemaker   TIA  Discharge Condition: improved  Diet recommendation: low sodium  Filed Weights   08/30/12 1839  Weight: 88.905 kg (196 lb)    History of present illness:  [name] is a 79 y.o. female with PMH of HTN, P.afib on coumadin, pacemaker, TIA presents to the ER with the above complaint.  Pt reports intermittent dizziness/lightheadedness, off and on for 2 months, this is noticed when she is either standing or ambulatory, never at rest, associated with balance problems, momentary and self limiting.  This morning she woke up, took a shower and got dressed and ready for church, she became extremely lightheaded, more than usual and lasted longer, she checked her BP and this was 216/116, called her PCPs office and was asked to come to the ER.  She denies any N/V/DIarrhea, no chest pain, SoB or palpitations.  No changes in her medications recently.    Hospital Course:  1. Dizziness acute on chronic  -etiology unclear, ? BPPV/dehydration -Orthostatics, TSH normal -no events on tele  - 2D ECHO with normal EF and wall motion -unable to check MRI due to pacer -significantly improved by next day -meclizine used PRN with good benefit -PT eval completed, no PT recommended -gentle hydrated as well  -pacemaker interrogation completed, no arrhythmias noted   2. P.afib  -rate controlled  -continue amiodarone and coumadin   3. HTN:  -stable now  -continue amlodipine and ARB  -resumed Triamterene HCTZ at discharge   4. GERD  -continue PPi     Procedures:  ECHO Study Conclusions - Left ventricle:  The cavity size was normal. There was mild concentric hypertrophy. Systolic function was normal. The estimated ejection fraction was in the range of 55% to 60%. Wall motion was normal; there were no regional wall motion abnormalities. - Aortic valve: Mild regurgitation. - Left atrium: The atrium was mildly dilated. - Right ventricle: The cavity size was mildly dilated. Wall thickness was normal. - Right atrium: The atrium was mildly dilated.    Consultations:  Pacemaker eval  Discharge Exam: Filed Vitals:   08/31/12 1437  BP: 146/81  Pulse: 72  Temp: 98.1 F (36.7 C)  Resp: 20    General: AAOx3 Cardiovascular: S1S2/RRR Respiratory: CTAB  Discharge Instructions  Discharge Orders   Future Orders Complete By Expires   Diet - low sodium heart healthy  As directed    Increase activity slowly  As directed        Medication List         acetaminophen 500 MG tablet  Commonly known as:  TYLENOL  Take 1,000 mg by mouth every 6 (six) hours as needed for pain.     amiodarone 200 MG tablet  Commonly known as:  PACERONE  Take 200 mg by mouth daily.     amLODipine 5 MG tablet  Commonly known as:  NORVASC  Take 5 mg by mouth daily.     b complex vitamins tablet  Take 1 tablet by mouth daily.     beta carotene 44010 UNIT capsule  Take 25,000 Units by mouth 3 (three) times a week.  bimatoprost 0.01 % Soln  Commonly known as:  LUMIGAN  Place 1 drop into both eyes at bedtime.     calcium carbonate 600 MG Tabs tablet  Commonly known as:  OS-CAL  Take 600 mg by mouth daily.     Cinnamon 500 MG Tabs  Take 2 tablets by mouth daily as needed (for blood sugar).     colchicine 0.6 MG tablet  Take 0.6 mg by mouth daily as needed (for flareups).     dexlansoprazole 60 MG capsule  Commonly known as:  DEXILANT  Take 60 mg by mouth daily.     diazepam 5 MG tablet  Commonly known as:  VALIUM  Take 5 mg by mouth daily as needed. Anxiety.     DUREZOL 0.05 % Emul   Generic drug:  Difluprednate  Place 1 drop into the right eye 3 (three) times daily.     MAXZIDE 75-50 MG per tablet  Generic drug:  triamterene-hydrochlorothiazide  Take 1 tablet by mouth at bedtime.     multivitamin with minerals Tabs tablet  Take 1 tablet by mouth daily.     nystatin 100000 UNIT/ML suspension  Commonly known as:  MYCOSTATIN  Take 500,000 Units by mouth daily as needed (for mouth irritation).     OVER THE COUNTER MEDICATION  Apply 1 application topically 2 (two) times daily. "Formula 3", OTC medication for toenail fungus.     potassium chloride SA 20 MEQ tablet  Commonly known as:  K-DUR,KLOR-CON  Take 20 mEq by mouth daily.     senna 8.6 MG tablet  Commonly known as:  SENOKOT  Take 2 tablets by mouth at bedtime.     valsartan 320 MG tablet  Commonly known as:  DIOVAN  Take 320 mg by mouth daily.     vitamin C 500 MG tablet  Commonly known as:  ASCORBIC ACID  Take 500 mg by mouth daily.     warfarin 5 MG tablet  Commonly known as:  COUMADIN  Take 5 mg by mouth daily.       Allergies  Allergen Reactions  . Prednisone Palpitations    tachycardia       Follow-up Information   Follow up with Johny Blamer, MD. Schedule an appointment as soon as possible for a visit in 1 week.   Specialty:  Family Medicine   Contact information:   [facility] [address] [place] [address] [phone]        The results of significant diagnostics from this hospitalization (including imaging, microbiology, ancillary and laboratory) are listed below for reference.    Significant Diagnostic Studies: Ct Head Wo Contrast  08/30/2012   *RADIOLOGY REPORT*  Clinical Data: Dizziness and hypertension.  CT HEAD WITHOUT CONTRAST  Technique:  Contiguous axial images were obtained from the base of the skull through the vertex without contrast.  Comparison: CT head without contrast 11/18/2008.  Findings: There is no significant  change in mild atrophy and white matter hypoattenuation.  No acute cortical infarct, hemorrhage, or mass lesion is present.  The ventricles are of normal size.  No significant extra-axial fluid collection is present.  The paranasal sinuses and mastoid air cells are clear.  IMPRESSION:  1.  Similar appearance of mild generalized atrophy and white matter disease.  This likely reflects the sequelae of chronic microvascular ischemia. 2.  No acute cardiopulmonary disease.   Original Report Authenticated By: Marin Roberts, M.D.    Microbiology: No results found for  this or any previous visit (from the past 240 hour(s)).   Labs: Basic Metabolic Panel: No results found for this basename: NA, K, CL, CO2, GLUCOSE, BUN, CREATININE, CALCIUM, MG, PHOS,  in the last 168 hours Liver Function Tests: No results found for this basename: AST, ALT, ALKPHOS, BILITOT, PROT, ALBUMIN,  in the last 168 hours No results found for this basename: LIPASE, AMYLASE,  in the last 168 hours No results found for this basename: AMMONIA,  in the last 168 hours CBC: No results found for this basename: WBC, NEUTROABS, HGB, HCT, MCV, PLT,  in the last 168 hours Cardiac Enzymes: No results found for this basename: CKTOTAL, CKMB, CKMBINDEX, TROPONINI,  in the last 168 hours BNP: BNP (last 3 results) No results found for this basename: PROBNP,  in the last 8760 hours CBG: No results found for this basename: GLUCAP,  in the last 168 hours     Signed:  Hayk Divis  [facility] 09/23/2012, 2:03 PM

## 2012-09-25 DIAGNOSIS — I4891 Unspecified atrial fibrillation: Secondary | ICD-10-CM | POA: Diagnosis not present

## 2012-09-25 DIAGNOSIS — Z7901 Long term (current) use of anticoagulants: Secondary | ICD-10-CM | POA: Diagnosis not present

## 2012-09-30 DIAGNOSIS — M545 Low back pain: Secondary | ICD-10-CM | POA: Diagnosis not present

## 2012-09-30 DIAGNOSIS — IMO0002 Reserved for concepts with insufficient information to code with codable children: Secondary | ICD-10-CM | POA: Diagnosis not present

## 2012-09-30 DIAGNOSIS — M48061 Spinal stenosis, lumbar region without neurogenic claudication: Secondary | ICD-10-CM | POA: Diagnosis not present

## 2012-09-30 DIAGNOSIS — M25569 Pain in unspecified knee: Secondary | ICD-10-CM | POA: Diagnosis not present

## 2012-10-07 DIAGNOSIS — M1710 Unilateral primary osteoarthritis, unspecified knee: Secondary | ICD-10-CM | POA: Diagnosis not present

## 2012-10-15 DIAGNOSIS — H15009 Unspecified scleritis, unspecified eye: Secondary | ICD-10-CM | POA: Diagnosis not present

## 2012-10-22 DIAGNOSIS — I4891 Unspecified atrial fibrillation: Secondary | ICD-10-CM | POA: Diagnosis not present

## 2012-10-22 DIAGNOSIS — Z7901 Long term (current) use of anticoagulants: Secondary | ICD-10-CM | POA: Diagnosis not present

## 2012-10-24 ENCOUNTER — Other Ambulatory Visit: Payer: Self-pay | Admitting: Interventional Cardiology

## 2012-10-24 DIAGNOSIS — I4891 Unspecified atrial fibrillation: Secondary | ICD-10-CM

## 2012-10-29 ENCOUNTER — Encounter: Payer: Self-pay | Admitting: *Deleted

## 2012-10-29 ENCOUNTER — Encounter: Payer: Self-pay | Admitting: Interventional Cardiology

## 2012-10-29 DIAGNOSIS — I639 Cerebral infarction, unspecified: Secondary | ICD-10-CM | POA: Insufficient documentation

## 2012-10-29 DIAGNOSIS — F329 Major depressive disorder, single episode, unspecified: Secondary | ICD-10-CM | POA: Insufficient documentation

## 2012-11-02 ENCOUNTER — Other Ambulatory Visit: Payer: Medicare Other

## 2012-11-03 ENCOUNTER — Other Ambulatory Visit: Payer: Medicare Other

## 2012-11-03 ENCOUNTER — Ambulatory Visit (INDEPENDENT_AMBULATORY_CARE_PROVIDER_SITE_OTHER): Payer: Medicare Other | Admitting: Interventional Cardiology

## 2012-11-03 ENCOUNTER — Encounter: Payer: Self-pay | Admitting: Interventional Cardiology

## 2012-11-03 VITALS — BP 148/82 | HR 76 | Ht 62.0 in | Wt 200.8 lb

## 2012-11-03 DIAGNOSIS — Z79899 Other long term (current) drug therapy: Secondary | ICD-10-CM | POA: Diagnosis not present

## 2012-11-03 DIAGNOSIS — Z7901 Long term (current) use of anticoagulants: Secondary | ICD-10-CM | POA: Diagnosis not present

## 2012-11-03 DIAGNOSIS — I5032 Chronic diastolic (congestive) heart failure: Secondary | ICD-10-CM

## 2012-11-03 DIAGNOSIS — I4891 Unspecified atrial fibrillation: Secondary | ICD-10-CM | POA: Diagnosis not present

## 2012-11-03 DIAGNOSIS — I480 Paroxysmal atrial fibrillation: Secondary | ICD-10-CM

## 2012-11-03 DIAGNOSIS — I10 Essential (primary) hypertension: Secondary | ICD-10-CM

## 2012-11-03 NOTE — Progress Notes (Signed)
Patient ID: [name], female   DOB: 07-16-1932, 79 y.o.   MRN: [id number]    HPI Brandy has fatigue and dyspnea on exertion. States that her energy level is very low. There is no orthopnea or PND. She does have lower extremity swelling. She denies chest discomfort. She has not felt right since starting amiodarone. She denies syncope, palpitations, and other complaints. After a long conversation we decided to discontinue amiodarone.  Allergies  Allergen Reactions  . Prednisone Palpitations    tachycardia    Current Outpatient Prescriptions  Medication Sig Dispense Refill  . acetaminophen (TYLENOL) 500 MG tablet Take 1,000 mg by mouth every 6 (six) hours as needed for pain.       Marland Kitchen amLODipine (NORVASC) 5 MG tablet Take 5 mg by mouth daily.      Marland Kitchen b complex vitamins tablet Take 1 tablet by mouth daily.      . beta carotene 40981 UNIT capsule Take 25,000 Units by mouth 3 (three) times a week.       . bimatoprost (LUMIGAN) 0.01 % SOLN Place 1 drop into both eyes at bedtime.      . calcium carbonate (OS-CAL) 600 MG TABS Take 600 mg by mouth daily.       . colchicine 0.6 MG tablet Take 0.6 mg by mouth daily as needed (for flareups).       Marland Kitchen dexlansoprazole (DEXILANT) 60 MG capsule Take 60 mg by mouth daily.       . diazepam (VALIUM) 5 MG tablet Take 5 mg by mouth daily as needed. Anxiety.      . Difluprednate (DUREZOL) 0.05 % EMUL Place 1 drop into the right eye 3 (three) times daily.      Marland Kitchen lidocaine (LIDODERM) 5 % Place 1 patch onto the skin as directed.       . methocarbamol (ROBAXIN) 500 MG tablet Take 500 mg by mouth 3 (three) times daily as needed.       . Multiple Vitamin (MULTIVITAMIN WITH MINERALS) TABS Take 1 tablet by mouth daily.      Marland Kitchen nystatin (MYCOSTATIN) 100000 UNIT/ML suspension Take 500,000 Units by mouth daily as needed (for mouth irritation).       Marland Kitchen OVER THE COUNTER MEDICATION Apply 1 application topically 2 (two) times daily. "Formula 3", OTC medication for toenail  fungus.      . potassium chloride SA (K-DUR,KLOR-CON) 20 MEQ tablet Take 20 mEq by mouth daily.      Marland Kitchen senna (SENOKOT) 8.6 MG tablet Take 2 tablets by mouth at bedtime.       . triamterene-hydrochlorothiazide (MAXZIDE) 75-50 MG per tablet Take 1 tablet by mouth at bedtime.       . valsartan (DIOVAN) 320 MG tablet Take 320 mg by mouth daily.      . vitamin C (ASCORBIC ACID) 500 MG tablet Take 500 mg by mouth daily.      Marland Kitchen warfarin (COUMADIN) 5 MG tablet Take 5 mg by mouth daily.       No current facility-administered medications for this visit.    Past Medical History  Diagnosis Date  . Hypertension     medication controled  . Angina   . Heart murmur   . Dysrhythmia   . Recurrent upper respiratory infection (URI)     one month ago  . Stroke   . Pneumonia     childhood  . GERD (gastroesophageal reflux disease)   . Anxiety   . Depression   .  Arthritis   . Diabetes mellitus   . Tuberculosis     during childhood unsure of time  . Gout     Past Surgical History  Procedure Laterality Date  . Tonsillectomy    . Tubal ligation    . Abdominal hysterectomy    . Pacemaker insertion      2008  . Back surgery      2010  . Cardiac catheterization      ROS: Appetite is good. She denies jaundice. No blood in the stool or urine. No transient neurological symptoms. She has concerns that amiodarone is causing her to feel tired and dyspneic here  PHYSICAL EXAM BP 148/82  Pulse 76  Ht 5\' 2"  (1.575 m)  Wt 200 lb 12.8 oz (91.082 kg)  BMI 36.72 kg/m2  SpO2 98% Obese. No acute distress. Chest is clear to auscultation and percussion. No significant JVD. No carotid bruits. S4 gallop is audible. Scratchy 106. Right upper sternal border. Systolic murmur. Right greater than left 1+ lower extremity edema. Neurological exam is intact.  EKG: No EKG performed today  ASSESSMENT AND PLAN  1. Followup for amiodarone therapy. Because of persistent complaints, and just not feeling well since  the medication was started, we have decided to discontinue.   2. Hypertension, controlled.  3. Diastolic heart failure, controlled without evidence of volume overload  4. No recent recurrence of atrial fibrillation.  5. Chronic anticoagulation therapy.  Overall, the patient does not feel as well as she would like. She is concerned about being on amiodarone and feels fatigue and dyspnea have occurred since the medication was initiated. We have decided to discontinue amiodarone. She will followup in 3 months at which time an EKG will be performed. She will call prior to that time if problems.

## 2012-11-03 NOTE — Patient Instructions (Addendum)
Discontinue amiodarone.  Your physician recommends that you schedule a follow-up appointment in: 3 MONTHS WITH DR.SMITH/WITH AN EKG

## 2012-11-10 ENCOUNTER — Other Ambulatory Visit: Payer: Self-pay

## 2012-11-10 ENCOUNTER — Ambulatory Visit (INDEPENDENT_AMBULATORY_CARE_PROVIDER_SITE_OTHER): Payer: Medicare Other

## 2012-11-10 VITALS — BP 159/82 | HR 86 | Temp 97.6°F | Resp 12 | Ht 62.0 in | Wt 198.0 lb

## 2012-11-10 DIAGNOSIS — R52 Pain, unspecified: Secondary | ICD-10-CM

## 2012-11-10 DIAGNOSIS — M722 Plantar fascial fibromatosis: Secondary | ICD-10-CM | POA: Diagnosis not present

## 2012-11-10 NOTE — Patient Instructions (Signed)
ICE INSTRUCTIONS  Apply ice or cold pack to the affected area at least 3 times a day for 10-15 minutes each time.  You should also use ice after prolonged activity or vigorous exercise.  Do not apply ice longer than 20 minutes at one time.  Always keep a cloth between your skin and the ice pack to prevent burns.  Being consistent and following these instructions will help control your symptoms.  We suggest you purchase a gel ice pack because they are reusable and do bit leak.  Some of them are designed to wrap around the area.  Use the method that works best for you.  Here are some other suggestions for icing.   Use a frozen bag of peas or corn-inexpensive and molds well to your body, usually stays frozen for 10 to 20 minutes.  Wet a towel with cold water and squeeze out the excess until it's damp.  Place in a bag in the freezer for 20 minutes. Then remove and use.   Plantar Fasciitis Plantar fasciitis is a common condition that causes foot pain. It is soreness (inflammation) of the band of tough fibrous tissue on the bottom of the foot that runs from the heel bone (calcaneus) to the ball of the foot. The cause of this soreness may be from excessive standing, poor fitting shoes, running on hard surfaces, being overweight, having an abnormal walk, or overuse (this is common in runners) of the painful foot or feet. It is also common in aerobic exercise dancers and ballet dancers. SYMPTOMS  Most people with plantar fasciitis complain of:  Severe pain in the morning on the bottom of their foot especially when taking the first steps out of bed. This pain recedes after a few minutes of walking.  Severe pain is experienced also during walking following a long period of inactivity.  Pain is worse when walking barefoot or up stairs DIAGNOSIS   Your caregiver will diagnose this condition by examining and feeling your foot.  Special tests such as X-rays of your foot, are usually not needed. PREVENTION     Consult a sports medicine professional before beginning a new exercise program.  Walking programs offer a good workout. With walking there is a lower chance of overuse injuries common to runners. There is less impact and less jarring of the joints.  Begin all new exercise programs slowly. If problems or pain develop, decrease the amount of time or distance until you are at a comfortable level.  Wear good shoes and replace them regularly.  Stretch your foot and the heel cords at the back of the ankle (Achilles tendon) both before and after exercise.  Run or exercise on even surfaces that are not hard. For example, asphalt is better than pavement.  Do not run barefoot on hard surfaces.  If using a treadmill, vary the incline.  Do not continue to workout if you have foot or joint problems. Seek professional help if they do not improve. HOME CARE INSTRUCTIONS   Avoid activities that cause you pain until you recover.  Use ice or cold packs on the problem or painful areas after working out.  Only take over-the-counter or prescription medicines for pain, discomfort, or fever as directed by your caregiver.  Soft shoe inserts or athletic shoes with air or gel sole cushions may be helpful.  If problems continue or become more severe, consult a sports medicine caregiver or your own health care provider. Cortisone is a potent anti-inflammatory medication that may   be injected into the painful area. You can discuss this treatment with your caregiver. MAKE SURE YOU:   Understand these instructions.  Will watch your condition.  Will get help right away if you are not doing well or get worse. Document Released: 10/09/2000 Document Revised: 04/08/2011 Document Reviewed: 12/09/2007 [employer] Patient Information 2014 [employer].  

## 2012-11-10 NOTE — Progress Notes (Signed)
  Subjective:    Patient ID: [name], female    DOB: [birth year], 79 y.o.   MRN: [id number]  HPI Comments: RT. FOOT '' THE HEEL IS PAINFUL. ITS BEEN HURTING FOR A MONTH AND SOAKING WITH EPSON SALT, BUT DOES NOT HELP A LOT''  Foot Pain This is a new problem. The current episode started 1 to 4 weeks ago. The problem occurs constantly. The problem has been rapidly worsening. The symptoms are aggravated by standing and walking. Treatments tried: SOAKING WITH EPSON SALT. The treatment provided mild relief.   patient does have diabetic shoes however around the house is wearing slippers. Has significant pain on palpation medial heel and arch right left foot is unremarkable. Patient also has had wearing the brace for arthritis of her right knee. She's posterior surgery at some point in the future.    Review of Systems  Constitutional: Negative.   HENT: Negative.   Eyes: Positive for visual disturbance.  Respiratory: Positive for shortness of breath.   Cardiovascular: Positive for leg swelling.  Gastrointestinal: Negative.   Endocrine: Negative.   Genitourinary: Positive for frequency.  Musculoskeletal: Negative.   Skin: Negative.   Allergic/Immunologic: Negative.   Neurological: Positive for dizziness.  Hematological: Negative.        Objective:   Physical Exam  Constitutional: She is oriented to person, place, and time. She appears well-developed and well-nourished.  Cardiovascular:  Pulses:      Dorsalis pedis pulses are 2+ on the right side, and 2+ on the left side.       Posterior tibial pulses are 1+ on the right side, and 1+ on the left side.  Capillary refill timed 3-4 seconds all digits. Skin temperature warm. Turgor normal. +1 edema mild. No rubor. No varicosities.  Musculoskeletal:  Rectus foot type bilateral mild flexible digital contractures 2 through 5 right. There is pain on palpation medial calcaneal tubercle mid arch right foot left foot is unremarkable. X-rays  reveal well-developed inferior calcaneal spur right no retrocalcaneal spur, mild thickening of fascial structure noted. Also mild spurring dorsal first metatarsal and lateral projection.  Neurological: She is alert and oriented to person, place, and time. She has normal strength and normal reflexes.  Epicritic sensation is somewhat diminished on Semmes Weinstein testing to the forefoot and digits. Normal plantar response and DTRs otherwise noted to  Skin: Skin is warm and dry. No cyanosis. Nails show no clubbing.  Hair growth absent bilateral skin color and pigment normal bilateral. Nails somewhat thickened and brittle and discolored bilateral 1 through 5  Psychiatric: She has a normal mood and affect. Her behavior is normal.          Assessment & Plan:  Based on clinical and radiographic findings assessment is plantar fasciitis/heel spur syndrome right foot plan at this time patient given 6 Celebrex 2 mg each for 6 days, cannot take other NSAIDs due to warfarin. Also recommended ice to the heel. Fascial strapping applied to the right foot. Maintain a good walking or athletic shoe at all times in particular for diabetic shoes are recommended. Recheck in 2-3 weeks for followup if she continues to have pain that  Alvan Dame DPM

## 2012-11-13 ENCOUNTER — Telehealth: Payer: Self-pay | Admitting: Interventional Cardiology

## 2012-11-13 NOTE — Telephone Encounter (Signed)
Continue to monitor the BP.  Okay to use amlodipine 5 mg twice daily and ultimately 10 mg once daily if BP dictates.

## 2012-11-13 NOTE — Telephone Encounter (Addendum)
New Problem  Pt states that Dr. Ralene Cork the Podiatrist  prescribed Celebrex for 5 days and Dr Katrinka Blazing took her off Amiodarone. She states her BP has elevated so she took an extra Amlodipine// Pt request a call back to discuss.Marland Kitchen

## 2012-11-13 NOTE — Telephone Encounter (Signed)
Close  

## 2012-11-13 NOTE — Telephone Encounter (Signed)
Pt called to let Dr. Katrinka Blazing know that Dr. Ralene Cork her podiatrist prescribed Celebrex for 5 days. Dr. Katrinka Blazing had taken her off the Amiodarone medication. Pt states her BP has been elevated this AM 201/90, Later today BP in the  190's now at 3:00 PM 157/86 after taking the second dose of Amlodipine 5 mg. Pt feels much better.

## 2012-11-16 NOTE — Telephone Encounter (Signed)
Pt aware of instructions.  Advised to call back if questions or needs

## 2012-11-23 ENCOUNTER — Other Ambulatory Visit: Payer: Self-pay | Admitting: Interventional Cardiology

## 2012-12-03 ENCOUNTER — Ambulatory Visit (INDEPENDENT_AMBULATORY_CARE_PROVIDER_SITE_OTHER): Payer: Medicare Other | Admitting: Pharmacist

## 2012-12-03 DIAGNOSIS — I4891 Unspecified atrial fibrillation: Secondary | ICD-10-CM

## 2012-12-03 LAB — POCT INR: INR: 2.1

## 2012-12-04 ENCOUNTER — Ambulatory Visit: Payer: Medicare Other

## 2012-12-11 ENCOUNTER — Ambulatory Visit (INDEPENDENT_AMBULATORY_CARE_PROVIDER_SITE_OTHER): Payer: Medicare Other

## 2012-12-11 VITALS — BP 122/68 | HR 84 | Resp 12

## 2012-12-11 DIAGNOSIS — M722 Plantar fascial fibromatosis: Secondary | ICD-10-CM

## 2012-12-11 DIAGNOSIS — E1140 Type 2 diabetes mellitus with diabetic neuropathy, unspecified: Secondary | ICD-10-CM

## 2012-12-11 DIAGNOSIS — E1142 Type 2 diabetes mellitus with diabetic polyneuropathy: Secondary | ICD-10-CM | POA: Diagnosis not present

## 2012-12-11 DIAGNOSIS — E1149 Type 2 diabetes mellitus with other diabetic neurological complication: Secondary | ICD-10-CM

## 2012-12-11 DIAGNOSIS — R52 Pain, unspecified: Secondary | ICD-10-CM

## 2012-12-11 NOTE — Progress Notes (Signed)
  Subjective:    Patient ID: [name], female    DOB: March 07, 1932, 79 y.o.   MRN: [id number]  HPI Comments: '' RT FOOT BOTTOM OF THE HEEL STILL HURTING''     Review of Systems deferred at this visit     Objective:   Physical Exam Neurovascular status is intact pedal pulses DP postal for PT thready one over 4 bilateral hair growth diminished absent bilateral nails sore criptotic orthopedic biomechanical exam reveals flexible digital contractures 2 through 5 there is notable bunion deformity lateral deviation of hallux bilateral patient and these have pain on palpation Magan plantar fascia right foot although improving fascial strapping was in place. At this time patient suffered steroid injection which she declined we'll keep that in mind if there's any further flare or fail to improve in the future at this time felt arch pads I to her current Plastizote orthoses which is worn and likely need replacing. At this time we'll schedule and get authorization for new diabetic extra-depth shoes and custom molded dual density Plastizote inlays sometime in the near future. I do feel patient benefit from continued use of orthoses/diabetic insoles with or arch support plantar fasciitis responding although not completely resolved.       Assessment & Plan:  Assessment this time plantar fasciitis/heel spur syndrome responding to biomechanical support will benefit from new diabetic shoes and custom molded dual density Plastizote inlays. Patient does have diabetes with neuropathy digital contractures and keratoses will benefit from new diabetic shoes inlays as scheduled with the next several weeks. Followup as instructed  Alvan Dame DPM

## 2012-12-11 NOTE — Patient Instructions (Signed)

## 2012-12-22 ENCOUNTER — Ambulatory Visit (INDEPENDENT_AMBULATORY_CARE_PROVIDER_SITE_OTHER): Payer: Medicare Other | Admitting: *Deleted

## 2012-12-22 DIAGNOSIS — I4891 Unspecified atrial fibrillation: Secondary | ICD-10-CM

## 2012-12-22 LAB — POCT INR: INR: 2.1

## 2012-12-22 MED ORDER — WARFARIN SODIUM 5 MG PO TABS: ORAL_TABLET | ORAL | Status: DC

## 2013-01-08 ENCOUNTER — Ambulatory Visit (INDEPENDENT_AMBULATORY_CARE_PROVIDER_SITE_OTHER): Payer: Medicare Other | Admitting: *Deleted

## 2013-01-08 DIAGNOSIS — E1149 Type 2 diabetes mellitus with other diabetic neurological complication: Secondary | ICD-10-CM

## 2013-01-08 DIAGNOSIS — E1142 Type 2 diabetes mellitus with diabetic polyneuropathy: Secondary | ICD-10-CM

## 2013-01-08 DIAGNOSIS — E1140 Type 2 diabetes mellitus with diabetic neuropathy, unspecified: Secondary | ICD-10-CM

## 2013-01-08 NOTE — Patient Instructions (Signed)
Our office will notify you once your diabetic shoes and insoles arrive. At that time an appointment will be needed to pick them up.  

## 2013-01-08 NOTE — Progress Notes (Signed)
Measured for diabetic shoes and insoles. 

## 2013-01-15 ENCOUNTER — Ambulatory Visit (INDEPENDENT_AMBULATORY_CARE_PROVIDER_SITE_OTHER): Payer: Medicare Other | Admitting: Pharmacist

## 2013-01-15 DIAGNOSIS — I4891 Unspecified atrial fibrillation: Secondary | ICD-10-CM

## 2013-01-19 ENCOUNTER — Other Ambulatory Visit: Payer: Self-pay | Admitting: Interventional Cardiology

## 2013-01-20 ENCOUNTER — Telehealth: Payer: Self-pay | Admitting: *Deleted

## 2013-01-20 NOTE — Telephone Encounter (Signed)
Pt request samples of Celebrex 200mg , she was given 6 sample pack by Dr. Ralene Cork on 11/10/2012.  Dr Jake Seats refill Celebrex #30.  I contacted pt, she states that her primary doctor called in the generic for Celebrex.

## 2013-02-05 ENCOUNTER — Encounter: Payer: Self-pay | Admitting: Nurse Practitioner

## 2013-02-05 ENCOUNTER — Ambulatory Visit (INDEPENDENT_AMBULATORY_CARE_PROVIDER_SITE_OTHER): Payer: Medicare Other | Admitting: Nurse Practitioner

## 2013-02-05 ENCOUNTER — Ambulatory Visit (INDEPENDENT_AMBULATORY_CARE_PROVIDER_SITE_OTHER): Payer: Medicare Other | Admitting: Pharmacist

## 2013-02-05 VITALS — BP 120/80 | HR 73 | Ht 62.5 in | Wt 204.1 lb

## 2013-02-05 DIAGNOSIS — I4891 Unspecified atrial fibrillation: Secondary | ICD-10-CM

## 2013-02-05 LAB — POCT INR: INR: 1.9

## 2013-02-05 MED ORDER — AMLODIPINE BESYLATE 5 MG PO TABS: 5.0000 mg | ORAL_TABLET | Freq: Every day | ORAL | Status: DC

## 2013-02-05 NOTE — Patient Instructions (Addendum)
Stay on your current medicines but cut the Norvasc back to just one pill and take each night at bedtime  Come one day next week for a nurse check and bring your BP cuff for [place] to check - we will then decide if you need more BP medicine  We will see you back as planned  Call the [place] office at [phone] if you have any questions, problems or concerns.

## 2013-02-05 NOTE — Progress Notes (Signed)
[name] Date of Birth: 1932-10-15 Medical Record #[id number]  History of Present Illness: [name] is seen back today for a 3 month check. She has PAF, previously on amiodarone, HTN, diastolic HF and chronic anticoagulation. She has a PPM in place which will be followed by Dr. Lovena Le. She is 80 years of age. Other problems as listed below.   Seen back in October - complaining of fatigue and dyspnea - amiodarone stopped as she felt this was the culprit. She remains on her coumadin.  Comes back today. Here alone. Has had issues with plantar fascitis. Still fatigued. Thinks her "mental capacity" has improved since being off the amiodarone. BP high at home. Now on higher doses of Norvasc - now with more swelling in her legs - tries to wear support stockings. Trying to restrict her salt. She is using a wrist cuff - has never been checked. Limited by knee pain. Using Celebrex on occasion but not regularly. No chest pain. Not as active as she would like to be.  Current Outpatient Prescriptions  Medication Sig Dispense Refill  . acetaminophen (TYLENOL) 500 MG tablet Take 1,000 mg by mouth every 6 (six) hours as needed for pain.       Marland Kitchen amLODipine (NORVASC) 5 MG tablet Take 5 mg by mouth daily. Take 2 tablets daily      . b complex vitamins tablet Take 1 tablet by mouth daily.      . beta carotene 25000 UNIT capsule Take 25,000 Units by mouth 3 (three) times a week.       . bimatoprost (LUMIGAN) 0.01 % SOLN Place 1 drop into both eyes at bedtime.      . calcium carbonate (OS-CAL) 600 MG TABS Take 600 mg by mouth daily.       . colchicine 0.6 MG tablet Take 0.6 mg by mouth daily as needed (for flareups).       Marland Kitchen dexlansoprazole (DEXILANT) 60 MG capsule Take 60 mg by mouth daily.       . diazepam (VALIUM) 5 MG tablet Take 5 mg by mouth daily as needed. Anxiety.      . Difluprednate (DUREZOL) 0.05 % EMUL Place 1 drop into the right eye 3 (three) times daily.      [name] 575 MG/5ML  SYRP Take by mouth as needed.      . lidocaine (LIDODERM) 5 % Place 1 patch onto the skin as directed.       . methocarbamol (ROBAXIN) 500 MG tablet Take 500 mg by mouth 3 (three) times daily as needed.       . Multiple Vitamin (MULTIVITAMIN WITH MINERALS) TABS Take 1 tablet by mouth daily.      Marland Kitchen nystatin (MYCOSTATIN) 100000 UNIT/ML suspension Take 500,000 Units by mouth daily as needed (for mouth irritation).       Marland Kitchen OVER THE COUNTER MEDICATION Apply 1 application topically 2 (two) times daily. "Formula 3", OTC medication for toenail fungus.      . potassium chloride SA (K-DUR,KLOR-CON) 20 MEQ tablet Take 20 mEq by mouth daily.      Marland Kitchen senna (SENOKOT) 8.6 MG tablet Take 2 tablets by mouth at bedtime.       . triamterene-hydrochlorothiazide (MAXZIDE) 75-50 MG per tablet take 1 tablet by mouth once daily  30 tablet  4  . valsartan (DIOVAN) 320 MG tablet take 1 tablet by mouth once daily  30 tablet  0  . vitamin C (ASCORBIC ACID) 500 MG tablet  Take 500 mg by mouth daily.      Marland Kitchen warfarin (COUMADIN) 5 MG tablet Take as directed by coumadin clinic  120 tablet  0   No current facility-administered medications for this visit.    Allergies  Allergen Reactions  . Prednisone Palpitations    tachycardia    Past Medical History  Diagnosis Date  . Hypertension     medication controled  . Angina   . Heart murmur   . Dysrhythmia   . Recurrent upper respiratory infection (URI)     one month ago  . Stroke   . Pneumonia     childhood  . GERD (gastroesophageal reflux disease)   . Anxiety   . Depression   . Arthritis   . Diabetes mellitus   . Tuberculosis     during childhood unsure of time  . Gout     Past Surgical History  Procedure Laterality Date  . Tonsillectomy    . Tubal ligation    . Abdominal hysterectomy    . Pacemaker insertion      2008  . Back surgery      2010  . Cardiac catheterization      History  Smoking status  . Never Smoker   Smokeless tobacco  . Never  Used    History  Alcohol Use  . Yes    Comment: rarely    Family History  Problem Relation Age of Onset  . Malignant hyperthermia Neg Hx   . Hypertension Mother   . Cancer Mother     Brain Tumor  . Heart disease Father   . Heart failure Sister   . Diabetes Sister     Review of Systems: The review of systems is per the HPI.  All other systems were reviewed and are negative.  Physical Exam: BP 120/80  Pulse 73  Ht 5' 2.5" (1.588 m)  Wt 204 lb 1.9 oz (92.588 kg)  BMI 36.72 kg/m2 BP is only 100/60 by me.  Patient is very pleasant and in no acute distress. She is obese. Skin is warm and dry. Color is normal.  HEENT is unremarkable. Normocephalic/atraumatic. PERRL. Sclera are nonicteric. Neck is supple. No masses. No JVD. Lungs are clear. Cardiac exam shows a regular rate and rhythm. Abdomen is soft. Extremities are quite full with edema. Gait and ROM are intact. No gross neurologic deficits noted.  LABORATORY DATA: EKG today shows a pacing.   Lab Results  Component Value Date   WBC 5.2 08/31/2012   HGB 13.3 08/31/2012   HCT 40.8 08/31/2012   PLT 249 08/31/2012   GLUCOSE 102* 08/31/2012   ALT 16 09/09/2011   AST 17 09/09/2011   NA 139 08/31/2012   K 3.4* 08/31/2012   CL 100 08/31/2012   CREATININE 0.93 08/31/2012   BUN 11 08/31/2012   CO2 32 08/31/2012   TSH 2.204 08/30/2012   INR 1.6 01/15/2013   Echo Study Conclusions from August 2014  - Left ventricle: The cavity size was normal. There was mild concentric hypertrophy. Systolic function was normal. The estimated ejection fraction was in the range of 55% to 60%. Wall motion was normal; there were no regional wall motion abnormalities. - Aortic valve: Mild regurgitation. - Left atrium: The atrium was mildly dilated. - Right ventricle: The cavity size was mildly dilated. Wall thickness was normal. - Right atrium: The atrium was mildly dilated.   Assessment / Plan: 1. PAF - is in sinus today. For PPM check later this  month.   2.  Chronic anticoagulation - rechecking INR today  3. HTN - her BP is actually quite low here with me today. I am concerned that her cuff is not accurate. I am cutting her Norvasc back to just one tablet and to take at bedtime. She will come next week for a nurse check with her BP cuff to check for correlation. We will then determine if additional medicines are needed.   She will see Dr. Lovena Le later this month for her PPM check and Dr. Tamala Julian early next month.   Patient is agreeable to this plan and will call if any problems develop in the interim.   Burtis Junes, RN, [place] [address] [place], [place]  [phone]

## 2013-02-08 DIAGNOSIS — E119 Type 2 diabetes mellitus without complications: Secondary | ICD-10-CM | POA: Diagnosis not present

## 2013-02-08 DIAGNOSIS — I10 Essential (primary) hypertension: Secondary | ICD-10-CM | POA: Diagnosis not present

## 2013-02-08 DIAGNOSIS — I509 Heart failure, unspecified: Secondary | ICD-10-CM | POA: Diagnosis not present

## 2013-02-08 DIAGNOSIS — R609 Edema, unspecified: Secondary | ICD-10-CM | POA: Diagnosis not present

## 2013-02-08 DIAGNOSIS — K219 Gastro-esophageal reflux disease without esophagitis: Secondary | ICD-10-CM | POA: Diagnosis not present

## 2013-02-11 ENCOUNTER — Ambulatory Visit (INDEPENDENT_AMBULATORY_CARE_PROVIDER_SITE_OTHER): Payer: Medicare Other | Admitting: *Deleted

## 2013-02-11 VITALS — BP 131/71 | HR 82

## 2013-02-11 DIAGNOSIS — I10 Essential (primary) hypertension: Secondary | ICD-10-CM

## 2013-02-11 NOTE — Progress Notes (Signed)
Chart reviewed/ pt saw Truitt Merle NP on last visit. Pt ambulated without assist to room for nurse visit for BP check. Pt states she is taking Norvasc 5 mg one tab in am and one tab in evening. I don't believe this is how she was to be taking. Manual bp with extra large cuff/ left arm. bp 136/82  p 70 I then used her automatic wrist cuff bp 131/71 p 71 Pt also informed me she saw her PCP on Monday 02/08/13 PCP wants to stop Maxzide and start Bumex, pt will start tomorrow.

## 2013-02-17 ENCOUNTER — Ambulatory Visit (INDEPENDENT_AMBULATORY_CARE_PROVIDER_SITE_OTHER): Payer: Medicare Other

## 2013-02-17 VITALS — BP 132/86 | HR 72 | Resp 12

## 2013-02-17 DIAGNOSIS — E1142 Type 2 diabetes mellitus with diabetic polyneuropathy: Secondary | ICD-10-CM

## 2013-02-17 DIAGNOSIS — R52 Pain, unspecified: Secondary | ICD-10-CM

## 2013-02-17 DIAGNOSIS — M722 Plantar fascial fibromatosis: Secondary | ICD-10-CM

## 2013-02-17 DIAGNOSIS — M2040 Other hammer toe(s) (acquired), unspecified foot: Secondary | ICD-10-CM

## 2013-02-17 DIAGNOSIS — E1140 Type 2 diabetes mellitus with diabetic neuropathy, unspecified: Secondary | ICD-10-CM

## 2013-02-17 DIAGNOSIS — E1149 Type 2 diabetes mellitus with other diabetic neurological complication: Secondary | ICD-10-CM

## 2013-02-17 NOTE — Progress Notes (Signed)
   Subjective:    Patient ID: [name], female    DOB: 03-Nov-1932, 80 y.o.   MRN: [id number]  HPI Comments: PICK UP DIABETIC SHOES AND GIVEN INSTRUCTION.  Diabetes      Review of Systems no new changes or findings     Objective:   Physical Exam Vascular status is intact with pedal pulses palpable DP and PT posterior were for Refill timed 3-4 seconds skin temperature warm turgor normal. Patient is dispensed 1 pair shoes and 3 pairs of dual density Plastizote inlays which fit and contour well to the patient's foot with full contact. Patient given oral and written instructions Medicare guidelines and rolls and oral explanation of break-in and use of shoes. Maintain a knee replacement insoles after proxy four-month views. Patient does have thick dystrophic criptotic nails will followup in the next one to 2 months for diabetic foot and nail in palliative nail care his future as needed. No open wounds ulcerations current time no signs of infection.       Assessment & Plan:  Assessment diabetes with neuropathy. Patient is digital contractures associated keratoses coming shoes and insoles dispensed a timely fitting with break in wearing instructions. Patient also benefit with the orthoses in supporting her plantar fascial symptomology as well we'll followup and reassessment the next month or 2 for diabetic foot and nail care in shoe check.  Harriet Masson DPM

## 2013-02-17 NOTE — Patient Instructions (Signed)
Diabetes and Foot Care Diabetes may cause you to have problems because of poor blood supply (circulation) to your feet and legs. This may cause the skin on your feet to become thinner, break easier, and heal more slowly. Your skin may become dry, and the skin may peel and crack. You may also have nerve damage in your legs and feet causing decreased feeling in them. You may not notice minor injuries to your feet that could lead to infections or more serious problems. Taking care of your feet is one of the most important things you can do for yourself.  HOME CARE INSTRUCTIONS  Wear shoes at all times, even in the house. Do not go barefoot. Bare feet are easily injured.  Check your feet daily for blisters, cuts, and redness. If you cannot see the bottom of your feet, use a mirror or ask someone for help.  Wash your feet with warm water (do not use hot water) and mild soap. Then pat your feet and the areas between your toes until they are completely dry. Do not soak your feet as this can dry your skin.  Apply a moisturizing lotion or petroleum jelly (that does not contain alcohol and is unscented) to the skin on your feet and to dry, brittle toenails. Do not apply lotion between your toes.  Trim your toenails straight across. Do not dig under them or around the cuticle. File the edges of your nails with an emery board or nail file.  Do not cut corns or calluses or try to remove them with medicine.  Wear clean socks or stockings every day. Make sure they are not too tight. Do not wear knee-high stockings since they may decrease blood flow to your legs.  Wear shoes that fit properly and have enough cushioning. To break in new shoes, wear them for just a few hours a day. This prevents you from injuring your feet. Always look in your shoes before you put them on to be sure there are no objects inside.  Do not cross your legs. This may decrease the blood flow to your feet.  If you find a minor scrape,  cut, or break in the skin on your feet, keep it and the skin around it clean and dry. These areas may be cleansed with mild soap and water. Do not cleanse the area with peroxide, alcohol, or iodine.  When you remove an adhesive bandage, be sure not to damage the skin around it.  If you have a wound, look at it several times a day to make sure it is healing.  Do not use heating pads or hot water bottles. They may burn your skin. If you have lost feeling in your feet or legs, you may not know it is happening until it is too late.  Make sure your health care provider performs a complete foot exam at least annually or more often if you have foot problems. Report any cuts, sores, or bruises to your health care provider immediately. SEEK MEDICAL CARE IF:   You have an injury that is not healing.  You have cuts or breaks in the skin.  You have an ingrown nail.  You notice redness on your legs or feet.  You feel burning or tingling in your legs or feet.  You have pain or cramps in your legs and feet.  Your legs or feet are numb.  Your feet always feel cold. SEEK IMMEDIATE MEDICAL CARE IF:   There is increasing redness,   swelling, or pain in or around a wound.  There is a red line that goes up your leg.  Pus is coming from a wound.  You develop a fever or as directed by your health care provider.  You notice a bad smell coming from an ulcer or wound. Document Released: 01/12/2000 Document Revised: 09/16/2012 Document Reviewed: 06/23/2012 [employer] Patient Information 2014 [employer].  

## 2013-02-20 DIAGNOSIS — R509 Fever, unspecified: Secondary | ICD-10-CM | POA: Diagnosis not present

## 2013-02-20 DIAGNOSIS — R05 Cough: Secondary | ICD-10-CM | POA: Diagnosis not present

## 2013-02-20 DIAGNOSIS — R059 Cough, unspecified: Secondary | ICD-10-CM | POA: Diagnosis not present

## 2013-02-20 DIAGNOSIS — J209 Acute bronchitis, unspecified: Secondary | ICD-10-CM | POA: Diagnosis not present

## 2013-02-22 DIAGNOSIS — I10 Essential (primary) hypertension: Secondary | ICD-10-CM | POA: Diagnosis not present

## 2013-02-22 DIAGNOSIS — I4891 Unspecified atrial fibrillation: Secondary | ICD-10-CM | POA: Diagnosis not present

## 2013-02-23 ENCOUNTER — Ambulatory Visit (INDEPENDENT_AMBULATORY_CARE_PROVIDER_SITE_OTHER): Payer: Medicare Other | Admitting: *Deleted

## 2013-02-23 ENCOUNTER — Encounter: Payer: Self-pay | Admitting: Internal Medicine

## 2013-02-23 ENCOUNTER — Ambulatory Visit (INDEPENDENT_AMBULATORY_CARE_PROVIDER_SITE_OTHER): Payer: Medicare Other | Admitting: Internal Medicine

## 2013-02-23 VITALS — BP 151/88 | HR 89 | Ht 62.5 in | Wt 198.8 lb

## 2013-02-23 DIAGNOSIS — I10 Essential (primary) hypertension: Secondary | ICD-10-CM

## 2013-02-23 DIAGNOSIS — Z950 Presence of cardiac pacemaker: Secondary | ICD-10-CM | POA: Diagnosis not present

## 2013-02-23 DIAGNOSIS — Z5181 Encounter for therapeutic drug level monitoring: Secondary | ICD-10-CM

## 2013-02-23 DIAGNOSIS — I4891 Unspecified atrial fibrillation: Secondary | ICD-10-CM

## 2013-02-23 LAB — MDC_IDC_ENUM_SESS_TYPE_INCLINIC
Battery Remaining Longevity: 51 mo
Brady Statistic AS VS Percent: 0 %
Lead Channel Impedance Value: 683 Ohm
Lead Channel Pacing Threshold Pulse Width: 0.4 ms
Lead Channel Sensing Intrinsic Amplitude: 8 mV
Lead Channel Setting Pacing Amplitude: 2 V
Lead Channel Setting Pacing Pulse Width: 0.4 ms
Lead Channel Setting Sensing Sensitivity: 4 mV
MDC IDC MSMT BATTERY IMPEDANCE: 728 Ohm
MDC IDC MSMT BATTERY VOLTAGE: 2.77 V
MDC IDC MSMT LEADCHNL RA IMPEDANCE VALUE: 467 Ohm
MDC IDC MSMT LEADCHNL RA PACING THRESHOLD AMPLITUDE: 0.5 V
MDC IDC MSMT LEADCHNL RA PACING THRESHOLD PULSEWIDTH: 0.4 ms
MDC IDC MSMT LEADCHNL RA SENSING INTR AMPL: 2 mV
MDC IDC MSMT LEADCHNL RV PACING THRESHOLD AMPLITUDE: 1.75 V
MDC IDC SESS DTM: 20150127201541
MDC IDC SET LEADCHNL RV PACING AMPLITUDE: 3.5 V
MDC IDC STAT BRADY AP VP PERCENT: 0 %
MDC IDC STAT BRADY AP VS PERCENT: 100 %
MDC IDC STAT BRADY AS VP PERCENT: 0 %

## 2013-02-23 LAB — POCT INR: INR: 1.6

## 2013-02-23 NOTE — Assessment & Plan Note (Signed)
Pacemaker interrogation demonstrates that she is maintaining sinus rhythm greater than 99% of the time. She'll continue her current medical therapy.

## 2013-02-23 NOTE — Assessment & Plan Note (Signed)
Her blood pressure is elevated today. She states that normally it is much better controlled. I've encouraged the patient to try to lose weight, maintain a low-sodium diet, and increase her physical activity.

## 2013-02-23 NOTE — Patient Instructions (Signed)
Your physician wants you to follow-up in: 12 months with Dr Knox Saliva will receive a reminder letter in the mail two months in advance. If you don't receive a letter, please call our office to schedule the follow-up appointment.  Remote monitoring is used to monitor your Pacemaker or ICD from home. This monitoring reduces the number of office visits required to check your device to one time per year. It allows [place] to keep an eye on the functioning of your device to ensure it is working properly. You are scheduled for a device check from home on 05/27/13. You may send your transmission at any time that day. If you have a wireless device, the transmission will be sent automatically. After your physician reviews your transmission, you will receive a postcard with your next transmission date.

## 2013-02-23 NOTE — Assessment & Plan Note (Signed)
Her Medtronic dual-chamber pacemaker is working normally. We'll plan to recheck in several months. 

## 2013-02-23 NOTE — Progress Notes (Signed)
HPI [name] is referred by Dr. Tamala Julian for a going evaluation and management of her [employer] dual-chamber pacemaker. The patient has a history of sinus node dysfunction and is status post permanent pacemaker insertion, initially in 2001. She underwent generator change out in 2008. She has chronic class II diastolic heart failure, and obesity. She also has a history of gout. In addition she has a history of paroxysmal atrial fibrillation though she is maintaining sinus rhythm very nicely. She denies anginal symptoms. No peripheral edema. No syncope. Allergies  Allergen Reactions  . Lasix [Furosemide] Other (See Comments)    "Too strong"  . Levsin [Hyoscyamine Sulfate]   . Procardia [Nifedipine] Other (See Comments)    GI uspet  . Prednisone Palpitations and Other (See Comments)    Tachycardia, fluttering     Current Outpatient Prescriptions  Medication Sig Dispense Refill  . acetaminophen (TYLENOL) 500 MG tablet Take 1,000 mg by mouth every 6 (six) hours as needed for pain.       Marland Kitchen amLODipine (NORVASC) 5 MG tablet Take 1 tablet (5 mg total) by mouth at bedtime.      Marland Kitchen b complex vitamins tablet Take 1 tablet by mouth daily.      . beta carotene 25000 UNIT capsule Take 25,000 Units by mouth 3 (three) times a week.       . bimatoprost (LUMIGAN) 0.01 % SOLN Place 1 drop into both eyes at bedtime.      . bumetanide (BUMEX) 0.5 MG tablet Take 0.5 mg by mouth daily.      . calcium carbonate (OS-CAL) 600 MG TABS Take 600 mg by mouth daily.       . diazepam (VALIUM) 5 MG tablet Take 5 mg by mouth daily as needed. Anxiety.      [name] 575 MG/5ML SYRP Take by mouth as needed.      . lidocaine (LIDODERM) 5 % Place 1 patch onto the skin as directed.       . methocarbamol (ROBAXIN) 500 MG tablet Take 500 mg by mouth 3 (three) times daily as needed.       . Multiple Vitamin (MULTIVITAMIN WITH MINERALS) TABS Take 1 tablet by mouth daily.      Marland Kitchen nystatin (MYCOSTATIN) 100000 UNIT/ML  suspension Take 500,000 Units by mouth daily as needed (for mouth irritation).       Marland Kitchen OVER THE COUNTER MEDICATION Apply 1 application topically 2 (two) times daily. "Formula 3", OTC medication for toenail fungus.      . potassium chloride SA (K-DUR,KLOR-CON) 20 MEQ tablet Take 20 mEq by mouth daily.      Marland Kitchen senna (SENOKOT) 8.6 MG tablet Take 2 tablets by mouth at bedtime.       . valsartan (DIOVAN) 320 MG tablet take 1 tablet by mouth once daily  30 tablet  0  . vitamin C (ASCORBIC ACID) 500 MG tablet Take 500 mg by mouth daily.      Marland Kitchen warfarin (COUMADIN) 5 MG tablet Take as directed by coumadin clinic  120 tablet  0   No current facility-administered medications for this visit.     Past Medical History  Diagnosis Date  . Hypertension     medication controled  . Angina   . Heart murmur   . Recurrent upper respiratory infection (URI)     one month ago  . Stroke   . Pneumonia     childhood  . GERD (gastroesophageal reflux disease)   .  Anxiety   . Depression   . Arthritis   . Diabetes mellitus   . Tuberculosis     during childhood unsure of time  . Gout   . Displacement of lumbar intervertebral disc without myelopathy   . Sciatica   . Effusion of ankle and foot joint   . IBS (irritable bowel syndrome)   . Coronary atherosclerosis of native coronary artery   . CAD (coronary artery disease)   . Venous insufficiency   . Osteoarthritis   . Pulmonary nodule, left   . Chronic diastolic CHF (congestive heart failure)   . Encounter for long-term (current) use of other medications   . Edema   . Dysrhythmia   . Atrial fibrillation     Basically chronic  . Tachy-brady syndrome     With permanent pacemaker 04/03/99  . PAF (paroxysmal atrial fibrillation)   . Multinodular goiter   . Hx-TIA (transient ischemic attack)   . Chronic bronchitis   . Lichen planus   . PPD positive     NEGATIVE Chest X Ray  . Sore neck     Constant soreness on the left side of neck/throat, a lot of post  nasal drainage, sensitivity of her tongue (especially after spicy foods).  . Numbness of toes 07/21/12    Left big toe has been numb/tingling/uncomfortable for a long time. Right 2nd toenail fell off all of a sudden.  . Right shoulder pain     Constant, has had PT.    ROS:   All systems reviewed and negative except as noted in the HPI.   Past Surgical History  Procedure Laterality Date  . Tonsillectomy    . Tubal ligation  1971  . Cataract extraction    . Pacemaker insertion  04/03/99    Dual chamber permanent transvenous PM, battery change & insertion of new dual chamber permanent transvenous PM on 07/23/06  . Back surgery      2010  . Cardiac catheterization    . Total abdominal hysterectomy w/ bilateral salpingoophorectomy    . Spinal stenosis surgery  08/2006     Family History  Problem Relation Age of Onset  . Malignant hyperthermia Neg Hx   . Hypertension Mother   . Cancer Mother     Maglignant Brain Tumor  . Heart disease Father   . Hypertension Father   . Heart failure Sister   . Diabetes Sister      History   Social History  . Marital Status: Widowed    Spouse Name: N/A    Number of Children: N/A  . Years of Education: N/A   Occupational History  . Not on file.   Social History Main Topics  . Smoking status: Never Smoker   . Smokeless tobacco: Never Used  . Alcohol Use: Yes     Comment: rarely  . Drug Use: No  . Sexual Activity: No   Other Topics Concern  . Not on file   Social History Narrative  . No narrative on file     BP 151/88  Pulse 89  Ht 5' 2.5" (1.588 m)  Wt 198 lb 12.8 oz (90.175 kg)  BMI 35.76 kg/m2  Physical Exam:  Well appearing  Obese, 80 year old woman, NAD HEENT: Unremarkable Neck:  6 cm JVD, no thyromegally Back:  No CVA tenderness Lungs:  Clear with no wheezes, rales, or rhonchi. HEART:  Regular rate rhythm, no murmurs, no rubs, no clicks Abd:  soft, positive bowel sounds, no organomegally, no rebound,  no  guarding Ext:  2 plus pulses, no edema, no cyanosis, no clubbing Skin:  No rashes no nodules Neuro:  CN II through XII intact, motor grossly intact  DEVICE  Normal device function.  See PaceArt for details.   Assess/Plan:

## 2013-02-24 ENCOUNTER — Other Ambulatory Visit: Payer: Self-pay | Admitting: Interventional Cardiology

## 2013-03-01 DIAGNOSIS — E119 Type 2 diabetes mellitus without complications: Secondary | ICD-10-CM | POA: Diagnosis not present

## 2013-03-01 DIAGNOSIS — H4011X0 Primary open-angle glaucoma, stage unspecified: Secondary | ICD-10-CM | POA: Diagnosis not present

## 2013-03-01 DIAGNOSIS — H409 Unspecified glaucoma: Secondary | ICD-10-CM | POA: Diagnosis not present

## 2013-03-04 ENCOUNTER — Other Ambulatory Visit: Payer: Self-pay

## 2013-03-04 MED ORDER — VALSARTAN 320 MG PO TABS: ORAL_TABLET | ORAL | Status: DC

## 2013-03-04 MED ORDER — AMLODIPINE BESYLATE 5 MG PO TABS: ORAL_TABLET | ORAL | Status: DC

## 2013-03-08 ENCOUNTER — Encounter: Payer: Self-pay | Admitting: Interventional Cardiology

## 2013-03-08 ENCOUNTER — Ambulatory Visit (INDEPENDENT_AMBULATORY_CARE_PROVIDER_SITE_OTHER): Payer: Medicare Other | Admitting: *Deleted

## 2013-03-08 ENCOUNTER — Ambulatory Visit (INDEPENDENT_AMBULATORY_CARE_PROVIDER_SITE_OTHER): Payer: Medicare Other | Admitting: Interventional Cardiology

## 2013-03-08 VITALS — BP 146/86 | HR 89 | Ht 62.5 in | Wt 203.0 lb

## 2013-03-08 DIAGNOSIS — I4891 Unspecified atrial fibrillation: Secondary | ICD-10-CM

## 2013-03-08 DIAGNOSIS — Z7901 Long term (current) use of anticoagulants: Secondary | ICD-10-CM

## 2013-03-08 DIAGNOSIS — Z5181 Encounter for therapeutic drug level monitoring: Secondary | ICD-10-CM

## 2013-03-08 DIAGNOSIS — Z79899 Other long term (current) drug therapy: Secondary | ICD-10-CM | POA: Diagnosis not present

## 2013-03-08 DIAGNOSIS — I5032 Chronic diastolic (congestive) heart failure: Secondary | ICD-10-CM | POA: Diagnosis not present

## 2013-03-08 LAB — POCT INR: INR: 2.4

## 2013-03-08 NOTE — Patient Instructions (Signed)
Your physician recommends that you continue on your current medications as directed. Please refer to the Current Medication list given to you today.  Your physician wants you to follow-up in: 6-9 months You will receive a reminder letter in the mail two months in advance. If you don't receive a letter, please call our office to schedule the follow-up appointment.  

## 2013-03-08 NOTE — Progress Notes (Signed)
Patient ID: [name], female   DOB: 12-04-1932, 80 y.o.   MRN: [id number]    [address], [place]  [address] Phone: [phone] Fax:  [phone]  Date:  03/08/2013   ID:  [name], DOB 01/17/1933, MRN [id number]  PCP:  Shirline Frees, MD   ASSESSMENT:  1. Amiodarone has been discontinued and the patient feels better. 2. History of paroxysmal atrial fibrillation, without clinical recurrence. Today's EKG reveals AV sequential paced 3. chronic anticoagulation therapy without side effects 4. Chronic diastolic heart failure, stable 5. Hypertension  PLAN:  1. Continue off amiodarone 2. Consider more aggressive diuretic regimen, perhaps 1 mg of Bumex 3. Clinical followup in 6-9 months 4. She is instructed to call if racing heart, dyspnea, or other evidence of heart failure appear   SUBJECTIVE: [name] is a 80 y.o. female who overall feels better off of amiodarone. She denies palpitations and increased heart rate. Lower extremity edema continues as a problem. Primary physician Dr. Kenton Kingfisher change Maxzide to bumetanide 0.5 mg daily. She has not noticed any significant improvement in lower extremity edema. She denies orthopnea PND.   Wt Readings from Last 3 Encounters:  03/08/13 203 lb (92.08 kg)  02/23/13 198 lb 12.8 oz (90.175 kg)  02/05/13 204 lb 1.9 oz (92.588 kg)     Past Medical History  Diagnosis Date  . Hypertension     medication controled  . Angina   . Heart murmur   . Recurrent upper respiratory infection (URI)     one month ago  . Stroke   . Pneumonia     childhood  . GERD (gastroesophageal reflux disease)   . Anxiety   . Depression   . Arthritis   . Diabetes mellitus   . Tuberculosis     during childhood unsure of time  . Gout   . Displacement of lumbar intervertebral disc without myelopathy   . Sciatica   . Effusion of ankle and foot joint   . IBS (irritable bowel syndrome)   . Coronary atherosclerosis  of native coronary artery   . CAD (coronary artery disease)   . Venous insufficiency   . Osteoarthritis   . Pulmonary nodule, left   . Chronic diastolic CHF (congestive heart failure)   . Encounter for long-term (current) use of other medications   . Edema   . Dysrhythmia   . Atrial fibrillation     Basically chronic  . Tachy-brady syndrome     With permanent pacemaker 04/03/99  . PAF (paroxysmal atrial fibrillation)   . Multinodular goiter   . Hx-TIA (transient ischemic attack)   . Chronic bronchitis   . Lichen planus   . PPD positive     NEGATIVE Chest X Ray  . Sore neck     Constant soreness on the left side of neck/throat, a lot of post nasal drainage, sensitivity of her tongue (especially after spicy foods).  . Numbness of toes 07/21/12    Left big toe has been numb/tingling/uncomfortable for a long time. Right 2nd toenail fell off all of a sudden.  . Right shoulder pain     Constant, has had PT.    Current Outpatient Prescriptions  Medication Sig Dispense Refill  . acetaminophen (TYLENOL) 500 MG tablet Take 1,000 mg by mouth every 6 (six) hours as needed for pain.       Marland Kitchen amLODipine (NORVASC) 5 MG tablet Take 1 tablet by mouth  twice daily  60 tablet  6  . b complex vitamins tablet Take 1 tablet by mouth daily.      . beta carotene 25000 UNIT capsule Take 25,000 Units by mouth 3 (three) times a week.       . bimatoprost (LUMIGAN) 0.01 % SOLN Place 1 drop into both eyes at bedtime.      . bumetanide (BUMEX) 0.5 MG tablet Take 0.5 mg by mouth daily.      . calcium carbonate (OS-CAL) 600 MG TABS Take 600 mg by mouth daily.       . diazepam (VALIUM) 5 MG tablet Take 5 mg by mouth daily as needed. Anxiety.      [name] 575 MG/5ML SYRP Take by mouth as needed.      . lidocaine (LIDODERM) 5 % Place 1 patch onto the skin as directed.       . methocarbamol (ROBAXIN) 500 MG tablet Take 500 mg by mouth 3 (three) times daily as needed.       . Multiple Vitamin (MULTIVITAMIN WITH  MINERALS) TABS Take 1 tablet by mouth daily.      Marland Kitchen nystatin (MYCOSTATIN) 100000 UNIT/ML suspension Take 500,000 Units by mouth daily as needed (for mouth irritation).       Marland Kitchen OVER THE COUNTER MEDICATION Apply 1 application topically 2 (two) times daily. "Formula 3", OTC medication for toenail fungus.      . potassium chloride SA (K-DUR,KLOR-CON) 20 MEQ tablet Take 20 mEq by mouth daily.      Marland Kitchen senna (SENOKOT) 8.6 MG tablet Take 2 tablets by mouth at bedtime.       . valsartan (DIOVAN) 320 MG tablet take 1 tablet by mouth once daily  30 tablet  6  . vitamin C (ASCORBIC ACID) 500 MG tablet Take 500 mg by mouth daily.      Marland Kitchen warfarin (COUMADIN) 5 MG tablet Take as directed by coumadin clinic  120 tablet  0   No current facility-administered medications for this visit.    Allergies:    Allergies  Allergen Reactions  . Lasix [Furosemide] Other (See Comments)    "Too strong"  . Levsin [Hyoscyamine Sulfate]   . Procardia [Nifedipine] Other (See Comments)    GI uspet  . Prednisone Palpitations and Other (See Comments)    Tachycardia, fluttering    Social History:  The patient  reports that she has never smoked. She has never used smokeless tobacco. She reports that she drinks alcohol. She reports that she does not use illicit drugs.   ROS:  Please see the history of present illness.   No bleeding on Coumadin. No transient neurological complaints.   All other systems reviewed and negative.   OBJECTIVE: VS:  BP 146/86  Pulse 89  Ht 5' 2.5" (1.588 m)  Wt 203 lb (92.08 kg)  BMI 36.51 kg/m2  SpO2 97% Well nourished, well developed, in no acute distress, healthy and younger than stated age 87: normal Neck: JVD flat. Carotid bruit absent  Cardiac:  normal S1, S2; RRR; no murmur Lungs:  clear to auscultation bilaterally, no wheezing, rhonchi or rales Abd: soft, nontender, no hepatomegaly Ext: Edema 1-2+ bilateral. Pulses absent Skin: warm and dry Neuro:  CNs 2-12 intact, no focal  abnormalities noted  EKG:  AV sequential pacing       Signed, Illene Labrador III, MD 03/08/2013 1:41 PM

## 2013-03-09 DIAGNOSIS — R609 Edema, unspecified: Secondary | ICD-10-CM | POA: Diagnosis not present

## 2013-03-09 DIAGNOSIS — E119 Type 2 diabetes mellitus without complications: Secondary | ICD-10-CM | POA: Diagnosis not present

## 2013-03-09 DIAGNOSIS — I10 Essential (primary) hypertension: Secondary | ICD-10-CM | POA: Diagnosis not present

## 2013-03-30 ENCOUNTER — Ambulatory Visit (INDEPENDENT_AMBULATORY_CARE_PROVIDER_SITE_OTHER): Payer: Medicare Other | Admitting: *Deleted

## 2013-03-30 DIAGNOSIS — Z5181 Encounter for therapeutic drug level monitoring: Secondary | ICD-10-CM

## 2013-03-30 DIAGNOSIS — I4891 Unspecified atrial fibrillation: Secondary | ICD-10-CM | POA: Diagnosis not present

## 2013-03-30 LAB — POCT INR: INR: 2.4

## 2013-04-20 ENCOUNTER — Ambulatory Visit (INDEPENDENT_AMBULATORY_CARE_PROVIDER_SITE_OTHER): Payer: Medicare Other

## 2013-04-20 VITALS — BP 156/83 | HR 88 | Resp 16

## 2013-04-20 DIAGNOSIS — E1140 Type 2 diabetes mellitus with diabetic neuropathy, unspecified: Secondary | ICD-10-CM

## 2013-04-20 DIAGNOSIS — L608 Other nail disorders: Secondary | ICD-10-CM | POA: Diagnosis not present

## 2013-04-20 DIAGNOSIS — E1149 Type 2 diabetes mellitus with other diabetic neurological complication: Secondary | ICD-10-CM | POA: Diagnosis not present

## 2013-04-20 NOTE — Patient Instructions (Signed)
Diabetes and Foot Care Diabetes may cause you to have problems because of poor blood supply (circulation) to your feet and legs. This may cause the skin on your feet to become thinner, break easier, and heal more slowly. Your skin may become dry, and the skin may peel and crack. You may also have nerve damage in your legs and feet causing decreased feeling in them. You may not notice minor injuries to your feet that could lead to infections or more serious problems. Taking care of your feet is one of the most important things you can do for yourself.  HOME CARE INSTRUCTIONS  Wear shoes at all times, even in the house. Do not go barefoot. Bare feet are easily injured.  Check your feet daily for blisters, cuts, and redness. If you cannot see the bottom of your feet, use a mirror or ask someone for help.  Wash your feet with warm water (do not use hot water) and mild soap. Then pat your feet and the areas between your toes until they are completely dry. Do not soak your feet as this can dry your skin.  Apply a moisturizing lotion or petroleum jelly (that does not contain alcohol and is unscented) to the skin on your feet and to dry, brittle toenails. Do not apply lotion between your toes.  Trim your toenails straight across. Do not dig under them or around the cuticle. File the edges of your nails with an emery board or nail file.  Do not cut corns or calluses or try to remove them with medicine.  Wear clean socks or stockings every day. Make sure they are not too tight. Do not wear knee-high stockings since they may decrease blood flow to your legs.  Wear shoes that fit properly and have enough cushioning. To break in new shoes, wear them for just a few hours a day. This prevents you from injuring your feet. Always look in your shoes before you put them on to be sure there are no objects inside.  Do not cross your legs. This may decrease the blood flow to your feet.  If you find a minor scrape,  cut, or break in the skin on your feet, keep it and the skin around it clean and dry. These areas may be cleansed with mild soap and water. Do not cleanse the area with peroxide, alcohol, or iodine.  When you remove an adhesive bandage, be sure not to damage the skin around it.  If you have a wound, look at it several times a day to make sure it is healing.  Do not use heating pads or hot water bottles. They may burn your skin. If you have lost feeling in your feet or legs, you may not know it is happening until it is too late.  Make sure your health care provider performs a complete foot exam at least annually or more often if you have foot problems. Report any cuts, sores, or bruises to your health care provider immediately. SEEK MEDICAL CARE IF:   You have an injury that is not healing.  You have cuts or breaks in the skin.  You have an ingrown nail.  You notice redness on your legs or feet.  You feel burning or tingling in your legs or feet.  You have pain or cramps in your legs and feet.  Your legs or feet are numb.  Your feet always feel cold. SEEK IMMEDIATE MEDICAL CARE IF:   There is increasing redness,   swelling, or pain in or around a wound.  There is a red line that goes up your leg.  Pus is coming from a wound.  You develop a fever or as directed by your health care provider.  You notice a bad smell coming from an ulcer or wound. Document Released: 01/12/2000 Document Revised: 09/16/2012 Document Reviewed: 06/23/2012 [employer] Patient Information 2014 [employer].  

## 2013-04-20 NOTE — Progress Notes (Signed)
   Subjective:    Patient ID: [name], female    DOB: 25-Dec-1932, 80 y.o.   MRN: [id number]  HPI Comments: "Trim the toenails"     Review of Systems no new changes or findings     Objective:   Physical Exam Vascular status is intact with pedal pulses palpable DP and PT +2/4 bilateral capillary refill 3-4 seconds epicritic and proprioceptive sensations are intact and symmetrical diminished on Semmes Weinstein testing to distal toes and plantar arch neurologically epicritic and proprioceptive sensations otherwise unremarkable normal plantar response and DTRs noted neurologically skin color pigment normal nails thick hypertrophic friable gratified thick dystrophic patient is been using formula 3 hours forgetting to use a regular basis continues to have thick brittle crumbly deformed and incurvated ingrowing nails 1 through 5 bilateral. Patient does have nonspine diabetes diet controlled last A1c was 5.3       Assessment & Plan:  Assessment this time his diabetes with history peripheral neuropathy mild digital contractures and keratoses patient does have thick brittle crumbly friable gratified nails 1 through 5 bilateral which are debrided and the presence of diabetes and complicated factors are 0.3 months for continued diabetic foot nail care to offer alternatives as far as a topical antifungal our patient chose to continue with formula 3 twice daily as instructed reappointed 3 months for nail  Harriet Masson DPM

## 2013-04-27 ENCOUNTER — Ambulatory Visit (INDEPENDENT_AMBULATORY_CARE_PROVIDER_SITE_OTHER): Payer: Medicare Other | Admitting: Pharmacist

## 2013-04-27 DIAGNOSIS — Z5181 Encounter for therapeutic drug level monitoring: Secondary | ICD-10-CM | POA: Diagnosis not present

## 2013-04-27 DIAGNOSIS — I4891 Unspecified atrial fibrillation: Secondary | ICD-10-CM | POA: Diagnosis not present

## 2013-04-27 LAB — POCT INR: INR: 2.3

## 2013-05-04 ENCOUNTER — Other Ambulatory Visit: Payer: Self-pay | Admitting: Interventional Cardiology

## 2013-05-20 DIAGNOSIS — I10 Essential (primary) hypertension: Secondary | ICD-10-CM | POA: Diagnosis not present

## 2013-05-20 DIAGNOSIS — M25559 Pain in unspecified hip: Secondary | ICD-10-CM | POA: Diagnosis not present

## 2013-05-20 DIAGNOSIS — R609 Edema, unspecified: Secondary | ICD-10-CM | POA: Diagnosis not present

## 2013-05-25 ENCOUNTER — Ambulatory Visit (INDEPENDENT_AMBULATORY_CARE_PROVIDER_SITE_OTHER): Payer: Medicare Other

## 2013-05-25 DIAGNOSIS — I4891 Unspecified atrial fibrillation: Secondary | ICD-10-CM

## 2013-05-25 DIAGNOSIS — Z5181 Encounter for therapeutic drug level monitoring: Secondary | ICD-10-CM | POA: Diagnosis not present

## 2013-05-25 LAB — POCT INR: INR: 1.2

## 2013-05-27 ENCOUNTER — Encounter: Payer: Medicare Other | Admitting: *Deleted

## 2013-06-08 ENCOUNTER — Ambulatory Visit (INDEPENDENT_AMBULATORY_CARE_PROVIDER_SITE_OTHER): Payer: Medicare Other | Admitting: *Deleted

## 2013-06-08 DIAGNOSIS — Z5181 Encounter for therapeutic drug level monitoring: Secondary | ICD-10-CM

## 2013-06-08 DIAGNOSIS — I4891 Unspecified atrial fibrillation: Secondary | ICD-10-CM

## 2013-06-08 LAB — POCT INR: INR: 2.2

## 2013-06-09 ENCOUNTER — Encounter: Payer: Self-pay | Admitting: *Deleted

## 2013-06-29 ENCOUNTER — Ambulatory Visit (INDEPENDENT_AMBULATORY_CARE_PROVIDER_SITE_OTHER): Payer: Medicare Other

## 2013-06-29 DIAGNOSIS — I4891 Unspecified atrial fibrillation: Secondary | ICD-10-CM

## 2013-06-29 DIAGNOSIS — Z5181 Encounter for therapeutic drug level monitoring: Secondary | ICD-10-CM | POA: Diagnosis not present

## 2013-06-29 LAB — POCT INR: INR: 1.9

## 2013-07-01 DIAGNOSIS — H4011X0 Primary open-angle glaucoma, stage unspecified: Secondary | ICD-10-CM | POA: Diagnosis not present

## 2013-07-01 DIAGNOSIS — H409 Unspecified glaucoma: Secondary | ICD-10-CM | POA: Diagnosis not present

## 2013-07-13 ENCOUNTER — Encounter: Payer: Self-pay | Admitting: Cardiology

## 2013-07-20 ENCOUNTER — Ambulatory Visit (INDEPENDENT_AMBULATORY_CARE_PROVIDER_SITE_OTHER): Payer: Medicare Other

## 2013-07-20 ENCOUNTER — Ambulatory Visit (INDEPENDENT_AMBULATORY_CARE_PROVIDER_SITE_OTHER): Payer: Medicare Other | Admitting: Pharmacist

## 2013-07-20 DIAGNOSIS — I4891 Unspecified atrial fibrillation: Secondary | ICD-10-CM

## 2013-07-20 DIAGNOSIS — E1140 Type 2 diabetes mellitus with diabetic neuropathy, unspecified: Secondary | ICD-10-CM

## 2013-07-20 DIAGNOSIS — E1142 Type 2 diabetes mellitus with diabetic polyneuropathy: Secondary | ICD-10-CM

## 2013-07-20 DIAGNOSIS — Z5181 Encounter for therapeutic drug level monitoring: Secondary | ICD-10-CM

## 2013-07-20 DIAGNOSIS — E1149 Type 2 diabetes mellitus with other diabetic neurological complication: Secondary | ICD-10-CM

## 2013-07-20 DIAGNOSIS — L608 Other nail disorders: Secondary | ICD-10-CM | POA: Diagnosis not present

## 2013-07-20 LAB — POCT INR: INR: 1.8

## 2013-07-20 NOTE — Progress Notes (Signed)
   Subjective:    Patient ID: [name], female    DOB: 23-Jan-1933, 80 y.o.   MRN: [id number]  HPI Comments: Pt states she is here for toenails 1 - 10 to be trimmed.     Review of Systems no new findings or systemic changes noted     Objective:   Physical Exam Or kidney vascular status is intact although diminished DP postal for PT plus one over 4 capillary refill timed 3-4 seconds. Epicritic sensations diminished on Semmes Weinstein testing to the digits and plantar arch. Neurologically epicritic sensations diminished there is normal plantar response DTRs not elicited dermatologically skin color pigment normal hair growth absent nails thick brittle discolored and incurvated friable and tender 1 through 5 bilateral. Patient also has irritation from bunion deformity bilateral with slight erythema and has significant promontory changes of both feet. No open wounds ulcerations no secondary infection is noted.       Assessment & Plan:  Assessment diabetes with history peripheral neuropathy patient does have digital contractures no keratoses friable dystrophic criptotic nails 1 through 5 bilateral are debrided at this time and the presence of diabetes and complications return for future palliative care is needed suggest 3 month followup  Harriet Masson DPM

## 2013-07-20 NOTE — Patient Instructions (Signed)
Diabetes and Foot Care Diabetes may cause you to have problems because of poor blood supply (circulation) to your feet and legs. This may cause the skin on your feet to become thinner, break easier, and heal more slowly. Your skin may become dry, and the skin may peel and crack. You may also have nerve damage in your legs and feet causing decreased feeling in them. You may not notice minor injuries to your feet that could lead to infections or more serious problems. Taking care of your feet is one of the most important things you can do for yourself.  HOME CARE INSTRUCTIONS  Wear shoes at all times, even in the house. Do not go barefoot. Bare feet are easily injured.  Check your feet daily for blisters, cuts, and redness. If you cannot see the bottom of your feet, use a mirror or ask someone for help.  Wash your feet with warm water (do not use hot water) and mild soap. Then pat your feet and the areas between your toes until they are completely dry. Do not soak your feet as this can dry your skin.  Apply a moisturizing lotion or petroleum jelly (that does not contain alcohol and is unscented) to the skin on your feet and to dry, brittle toenails. Do not apply lotion between your toes.  Trim your toenails straight across. Do not dig under them or around the cuticle. File the edges of your nails with an emery board or nail file.  Do not cut corns or calluses or try to remove them with medicine.  Wear clean socks or stockings every day. Make sure they are not too tight. Do not wear knee-high stockings since they may decrease blood flow to your legs.  Wear shoes that fit properly and have enough cushioning. To break in new shoes, wear them for just a few hours a day. This prevents you from injuring your feet. Always look in your shoes before you put them on to be sure there are no objects inside.  Do not cross your legs. This may decrease the blood flow to your feet.  If you find a minor scrape,  cut, or break in the skin on your feet, keep it and the skin around it clean and dry. These areas may be cleansed with mild soap and water. Do not cleanse the area with peroxide, alcohol, or iodine.  When you remove an adhesive bandage, be sure not to damage the skin around it.  If you have a wound, look at it several times a day to make sure it is healing.  Do not use heating pads or hot water bottles. They may burn your skin. If you have lost feeling in your feet or legs, you may not know it is happening until it is too late.  Make sure your health care provider performs a complete foot exam at least annually or more often if you have foot problems. Report any cuts, sores, or bruises to your health care provider immediately. SEEK MEDICAL CARE IF:   You have an injury that is not healing.  You have cuts or breaks in the skin.  You have an ingrown nail.  You notice redness on your legs or feet.  You feel burning or tingling in your legs or feet.  You have pain or cramps in your legs and feet.  Your legs or feet are numb.  Your feet always feel cold. SEEK IMMEDIATE MEDICAL CARE IF:   There is increasing redness,   swelling, or pain in or around a wound.  There is a red line that goes up your leg.  Pus is coming from a wound.  You develop a fever or as directed by your health care provider.  You notice a bad smell coming from an ulcer or wound. Document Released: 01/12/2000 Document Revised: 09/16/2012 Document Reviewed: 06/23/2012 Exit[employer] Patient Information 2015 [employer]. This information is not intended to replace advice given to you by your health care provider. Make sure you discuss any questions you have with your health care provider.  

## 2013-08-05 DIAGNOSIS — M25569 Pain in unspecified knee: Secondary | ICD-10-CM | POA: Diagnosis not present

## 2013-08-05 DIAGNOSIS — R6884 Jaw pain: Secondary | ICD-10-CM | POA: Diagnosis not present

## 2013-08-05 DIAGNOSIS — W19XXXA Unspecified fall, initial encounter: Secondary | ICD-10-CM | POA: Diagnosis not present

## 2013-08-06 DIAGNOSIS — M1710 Unilateral primary osteoarthritis, unspecified knee: Secondary | ICD-10-CM | POA: Diagnosis not present

## 2013-08-06 DIAGNOSIS — M542 Cervicalgia: Secondary | ICD-10-CM | POA: Diagnosis not present

## 2013-08-11 DIAGNOSIS — M542 Cervicalgia: Secondary | ICD-10-CM | POA: Diagnosis not present

## 2013-08-11 DIAGNOSIS — M1710 Unilateral primary osteoarthritis, unspecified knee: Secondary | ICD-10-CM | POA: Diagnosis not present

## 2013-08-17 DIAGNOSIS — M542 Cervicalgia: Secondary | ICD-10-CM | POA: Diagnosis not present

## 2013-08-17 DIAGNOSIS — M5030 Other cervical disc degeneration, unspecified cervical region: Secondary | ICD-10-CM | POA: Diagnosis not present

## 2013-08-19 DIAGNOSIS — M5030 Other cervical disc degeneration, unspecified cervical region: Secondary | ICD-10-CM | POA: Diagnosis not present

## 2013-08-19 DIAGNOSIS — M542 Cervicalgia: Secondary | ICD-10-CM | POA: Diagnosis not present

## 2013-08-24 DIAGNOSIS — M542 Cervicalgia: Secondary | ICD-10-CM | POA: Diagnosis not present

## 2013-08-24 DIAGNOSIS — M5030 Other cervical disc degeneration, unspecified cervical region: Secondary | ICD-10-CM | POA: Diagnosis not present

## 2013-08-26 DIAGNOSIS — M5030 Other cervical disc degeneration, unspecified cervical region: Secondary | ICD-10-CM | POA: Diagnosis not present

## 2013-08-26 DIAGNOSIS — M542 Cervicalgia: Secondary | ICD-10-CM | POA: Diagnosis not present

## 2013-09-03 ENCOUNTER — Ambulatory Visit (INDEPENDENT_AMBULATORY_CARE_PROVIDER_SITE_OTHER): Payer: Medicare Other | Admitting: Pharmacist

## 2013-09-03 DIAGNOSIS — Z5181 Encounter for therapeutic drug level monitoring: Secondary | ICD-10-CM | POA: Diagnosis not present

## 2013-09-03 DIAGNOSIS — I4891 Unspecified atrial fibrillation: Secondary | ICD-10-CM

## 2013-09-03 DIAGNOSIS — M25569 Pain in unspecified knee: Secondary | ICD-10-CM | POA: Diagnosis not present

## 2013-09-03 LAB — POCT INR: INR: 1.7

## 2013-09-07 ENCOUNTER — Other Ambulatory Visit: Payer: Self-pay | Admitting: Orthopaedic Surgery

## 2013-09-07 DIAGNOSIS — M1990 Unspecified osteoarthritis, unspecified site: Secondary | ICD-10-CM | POA: Diagnosis not present

## 2013-09-07 DIAGNOSIS — I4891 Unspecified atrial fibrillation: Secondary | ICD-10-CM | POA: Diagnosis not present

## 2013-09-07 DIAGNOSIS — R609 Edema, unspecified: Secondary | ICD-10-CM | POA: Diagnosis not present

## 2013-09-07 DIAGNOSIS — I10 Essential (primary) hypertension: Secondary | ICD-10-CM | POA: Diagnosis not present

## 2013-09-07 DIAGNOSIS — F411 Generalized anxiety disorder: Secondary | ICD-10-CM | POA: Diagnosis not present

## 2013-09-07 DIAGNOSIS — E119 Type 2 diabetes mellitus without complications: Secondary | ICD-10-CM | POA: Diagnosis not present

## 2013-09-09 DIAGNOSIS — M542 Cervicalgia: Secondary | ICD-10-CM | POA: Diagnosis not present

## 2013-09-09 DIAGNOSIS — M5030 Other cervical disc degeneration, unspecified cervical region: Secondary | ICD-10-CM | POA: Diagnosis not present

## 2013-09-15 DIAGNOSIS — M5030 Other cervical disc degeneration, unspecified cervical region: Secondary | ICD-10-CM | POA: Diagnosis not present

## 2013-09-15 DIAGNOSIS — M542 Cervicalgia: Secondary | ICD-10-CM | POA: Diagnosis not present

## 2013-09-17 ENCOUNTER — Ambulatory Visit (INDEPENDENT_AMBULATORY_CARE_PROVIDER_SITE_OTHER): Payer: Medicare Other

## 2013-09-17 ENCOUNTER — Other Ambulatory Visit: Payer: Self-pay | Admitting: Orthopaedic Surgery

## 2013-09-17 ENCOUNTER — Telehealth: Payer: Self-pay | Admitting: Interventional Cardiology

## 2013-09-17 DIAGNOSIS — Z5181 Encounter for therapeutic drug level monitoring: Secondary | ICD-10-CM | POA: Diagnosis not present

## 2013-09-17 DIAGNOSIS — I4891 Unspecified atrial fibrillation: Secondary | ICD-10-CM | POA: Diagnosis not present

## 2013-09-17 LAB — POCT INR: INR: 2.5

## 2013-09-17 NOTE — Telephone Encounter (Signed)
Schedule to see Dr. Tamala Julian on 10-11-13.   Need to get clearance for knee surgery on 10-19-13.   Want to know if I can see if soon.

## 2013-09-20 DIAGNOSIS — M542 Cervicalgia: Secondary | ICD-10-CM | POA: Diagnosis not present

## 2013-09-20 DIAGNOSIS — M5030 Other cervical disc degeneration, unspecified cervical region: Secondary | ICD-10-CM | POA: Diagnosis not present

## 2013-09-20 NOTE — Telephone Encounter (Signed)
Pt thinks she sent a remote in April or May; we have never received a transmission.  We spent a good deal of time attempting to appropriately connect her carelink without success. Device clinic appt made for 09/30/13.   Pt will be seen in person every six months.

## 2013-09-20 NOTE — Telephone Encounter (Signed)
New question:    Per pt got a letter from device about checking it.  Please giver her a call back she does not know what to do.

## 2013-09-23 ENCOUNTER — Telehealth: Payer: Self-pay

## 2013-09-23 NOTE — Telephone Encounter (Signed)
Message copied by Theophilus Kinds on Thu Sep 23, 2013  9:49 AM ------      Message from: Daneen Schick      Created: Tue Sep 21, 2013  6:52 PM       Okay to hold coumadin 5 days prior to surgery.      ----- Message -----         From: Flossie Dibble, RN         Sent: 09/17/2013  10:21 AM           To: Belva Crome III, MD            Pt is scheduled for knee replacement surgery with Dr Rhona Raider on 10/19/13.  Pt needs cardiac clearance scheduled for appt 10/11/13, pt trying to get sooner appt, has been instructed to hold Coumadin 5 days prior to surgery.  Please advise if ok to hold Coumadin.  Thanks       ------

## 2013-09-23 NOTE — Telephone Encounter (Signed)
Called spoke with pt advised Dr Tamala Julian cleared pt for upcoming knee replacement surgery on 10/19/13, and ok to hold Coumadin 5 day prior to procedure.  Pt has scheduled appt on 10/11/13 for Coumadin clinic and to see Dr Tamala Julian.  Will further instruct pt on when to start holding Coumadin for procedure at that time.  Pt verbalized understanding.

## 2013-09-28 DIAGNOSIS — M5030 Other cervical disc degeneration, unspecified cervical region: Secondary | ICD-10-CM | POA: Diagnosis not present

## 2013-09-28 DIAGNOSIS — M542 Cervicalgia: Secondary | ICD-10-CM | POA: Diagnosis not present

## 2013-09-30 ENCOUNTER — Ambulatory Visit (INDEPENDENT_AMBULATORY_CARE_PROVIDER_SITE_OTHER): Payer: Medicare Other | Admitting: *Deleted

## 2013-09-30 DIAGNOSIS — I495 Sick sinus syndrome: Secondary | ICD-10-CM | POA: Diagnosis not present

## 2013-09-30 LAB — MDC_IDC_ENUM_SESS_TYPE_INCLINIC
Battery Impedance: 911 Ohm
Battery Remaining Longevity: 43 mo
Battery Voltage: 2.76 V
Brady Statistic AP VP Percent: 0 %
Brady Statistic AS VP Percent: 0 %
Date Time Interrogation Session: 20150903112632
Lead Channel Impedance Value: 474 Ohm
Lead Channel Pacing Threshold Amplitude: 0.5 V
Lead Channel Sensing Intrinsic Amplitude: 2.8 mV
Lead Channel Sensing Intrinsic Amplitude: 8 mV
Lead Channel Setting Pacing Amplitude: 2 V
Lead Channel Setting Pacing Amplitude: 2.5 V
Lead Channel Setting Pacing Pulse Width: 0.76 ms
MDC IDC MSMT LEADCHNL RA PACING THRESHOLD PULSEWIDTH: 0.4 ms
MDC IDC MSMT LEADCHNL RV IMPEDANCE VALUE: 619 Ohm
MDC IDC MSMT LEADCHNL RV PACING THRESHOLD AMPLITUDE: 1.25 V
MDC IDC MSMT LEADCHNL RV PACING THRESHOLD PULSEWIDTH: 0.76 ms
MDC IDC SET LEADCHNL RV SENSING SENSITIVITY: 4 mV
MDC IDC STAT BRADY AP VS PERCENT: 97 %
MDC IDC STAT BRADY AS VS PERCENT: 2 %

## 2013-09-30 NOTE — Progress Notes (Signed)
Pacemaker check in clinic. Normal device function. Thresholds, sensing, impedances consistent with previous measurements. Device programmed to maximize longevity. 31 mode switches all < 1 minute, + coumadin, 1> 96 hours.  No high ventricular rates noted. Device programmed at appropriate safety margins. Histogram distribution appropriate for patient activity level. Device programmed to optimize intrinsic conduction. Estimated longevity 3.5 years.  Patient education completed.  ROV 6 months with Dr.Taylor.

## 2013-10-11 ENCOUNTER — Ambulatory Visit (INDEPENDENT_AMBULATORY_CARE_PROVIDER_SITE_OTHER): Payer: Medicare Other | Admitting: Interventional Cardiology

## 2013-10-11 ENCOUNTER — Encounter (HOSPITAL_COMMUNITY): Payer: Self-pay | Admitting: Pharmacy Technician

## 2013-10-11 ENCOUNTER — Encounter: Payer: Self-pay | Admitting: Interventional Cardiology

## 2013-10-11 ENCOUNTER — Ambulatory Visit (INDEPENDENT_AMBULATORY_CARE_PROVIDER_SITE_OTHER): Payer: Medicare Other | Admitting: Pharmacist

## 2013-10-11 VITALS — BP 160/82 | HR 85 | Ht 62.0 in | Wt 199.0 lb

## 2013-10-11 DIAGNOSIS — I4891 Unspecified atrial fibrillation: Secondary | ICD-10-CM | POA: Diagnosis not present

## 2013-10-11 DIAGNOSIS — I495 Sick sinus syndrome: Secondary | ICD-10-CM

## 2013-10-11 DIAGNOSIS — Z5181 Encounter for therapeutic drug level monitoring: Secondary | ICD-10-CM

## 2013-10-11 DIAGNOSIS — I10 Essential (primary) hypertension: Secondary | ICD-10-CM | POA: Diagnosis not present

## 2013-10-11 LAB — POCT INR: INR: 2.2

## 2013-10-11 MED ORDER — AMLODIPINE BESYLATE 5 MG PO TABS: ORAL_TABLET | ORAL | Status: DC

## 2013-10-11 MED ORDER — BUMETANIDE 0.5 MG PO TABS: 0.5000 mg | ORAL_TABLET | Freq: Every day | ORAL | Status: DC

## 2013-10-11 MED ORDER — VALSARTAN 320 MG PO TABS: ORAL_TABLET | ORAL | Status: DC

## 2013-10-11 MED ORDER — POTASSIUM CHLORIDE CRYS ER 20 MEQ PO TBCR: 20.0000 meq | EXTENDED_RELEASE_TABLET | Freq: Every day | ORAL | Status: DC

## 2013-10-11 NOTE — Progress Notes (Signed)
Patient ID: [name], female   DOB: November 26, 1932, 80 y.o.   MRN: [id number]    [address], [address] [place], [place]  [address] Phone: [phone] Fax:  [phone]  Date:  10/11/2013   ID:  [name], DOB 1932-12-19, MRN [id number]  PCP:  Shirline Frees, MD   ASSESSMENT:  1. Cardiovascular preoperative examination 2. Hypertension, elevated but has been off Diovan for nearly 7 days because cannot get her prescription refilled 3. Diastolic heart failure, stable 4. Paroxysmal atrial fibrillation 5. Coumadin therapy  PLAN:  1. Cleared for upcoming knee replacement surgery by Dr. Novella Olive 2. Clinical followup in 6-9 months 3. Stop Coumadin at least 72 hours prior to surgery, or earlier if instructed to do so by orthopedics 4. Refill antihypertensive therapy   SUBJECTIVE: [name] is a 80 y.o. female who is doing well and denies angina. She has not had orthopnea or PND. She has dependent edema, which is unchanged. No episodes of syncope. She did have a bad fall with injury to her right shoulder that is now significantly improved. She is scheduled to have orthopedic surgery on the right knee by Dr. Novella Olive on 10/19/2013.   Wt Readings from Last 3 Encounters:  10/11/13 199 lb (90.266 kg)  03/08/13 203 lb (92.08 kg)  02/23/13 198 lb 12.8 oz (90.175 kg)     Past Medical History  Diagnosis Date  . Hypertension     medication controled  . GERD (gastroesophageal reflux disease)   . Anxiety   . Depression   . Tuberculosis     during childhood unsure of time  . Gout   . Effusion of ankle and foot joint   . IBS (irritable bowel syndrome)   . Coronary atherosclerosis of native coronary artery   . Venous insufficiency   . Osteoarthritis   . Pulmonary nodule, left   . Chronic diastolic CHF (congestive heart failure)   . Encounter for long-term (current) use of other medications   . Tachy-brady syndrome     With permanent pacemaker 04/03/99  . PAF  (paroxysmal atrial fibrillation)   . Multinodular goiter   . Hx-TIA (transient ischemic attack)   . Lichen planus   . PPD positive     NEGATIVE Chest X Ray    Current Outpatient Prescriptions  Medication Sig Dispense Refill  . acetaminophen (TYLENOL) 500 MG tablet Take 1,000 mg by mouth every 6 (six) hours as needed for pain.       Marland Kitchen b complex vitamins tablet Take 1 tablet by mouth daily.      . beta carotene 25000 UNIT capsule Take 25,000 Units by mouth 3 (three) times a week.       . bimatoprost (LUMIGAN) 0.01 % SOLN Place 1 drop into both eyes at bedtime.      . bumetanide (BUMEX) 0.5 MG tablet Take 1 tablet (0.5 mg total) by mouth daily.  90 tablet  3  . calcium carbonate (OS-CAL) 600 MG TABS Take 600 mg by mouth daily.       . diazepam (VALIUM) 5 MG tablet Take 5 mg by mouth daily as needed for anxiety.       [name] 575 MG/5ML SYRP Take 575 mg by mouth daily as needed (cold symptoms).       Marland Kitchen lidocaine (LIDODERM) 5 % Place 1 patch onto the skin daily.       . methocarbamol (ROBAXIN) 500 MG tablet Take 500 mg by mouth 3 (  three) times daily as needed for muscle spasms.       . Multiple Vitamin (MULTIVITAMIN WITH MINERALS) TABS Take 1 tablet by mouth daily.      Marland Kitchen nystatin (MYCOSTATIN) 100000 UNIT/ML suspension Take 500,000 Units by mouth daily as needed (for mouth irritation).       Marland Kitchen OVER THE COUNTER MEDICATION Apply 1 application topically 2 (two) times daily. "Formula 3", OTC medication for toenail fungus.      . potassium chloride SA (K-DUR,KLOR-CON) 20 MEQ tablet Take 1 tablet (20 mEq total) by mouth daily.  90 tablet  3  . senna (SENOKOT) 8.6 MG tablet Take 2 tablets by mouth at bedtime.       . vitamin C (ASCORBIC ACID) 500 MG tablet Take 500 mg by mouth daily.      Marland Kitchen amLODipine (NORVASC) 5 MG tablet Take 5 mg by mouth daily.      . celecoxib (CELEBREX) 200 MG capsule Take 200 mg by mouth daily.      . Glycerin-Polysorbate 80 (REFRESH DRY EYE THERAPY OP) Apply 1 drop to  eye daily as needed (dry eyes).      . valsartan (DIOVAN) 320 MG tablet Take 320 mg by mouth daily.      Marland Kitchen warfarin (COUMADIN) 5 MG tablet Take 5-7.5 mg by mouth daily. 7.5mg  on Monday and Friday and 5mg  all other days.       No current facility-administered medications for this visit.    Allergies:    Allergies  Allergen Reactions  . Lasix [Furosemide] Other (See Comments)    "Too strong"  . Levsin [Hyoscyamine Sulfate]   . Procardia [Nifedipine] Other (See Comments)    GI uspet  . Prednisone Palpitations and Other (See Comments)    Tachycardia, fluttering    Social History:  The patient  reports that she has never smoked. She has never used smokeless tobacco. She reports that she drinks alcohol. She reports that she does not use illicit drugs.   ROS:  Please see the history of present illness.   No syncope, angina, orthopnea, or prolonged palpitations.   All other systems reviewed and negative.   OBJECTIVE: VS:  BP 160/82  Pulse 85  Ht 5\' 2"  (1.575 m)  Wt 199 lb (90.266 kg)  BMI 36.39 kg/m2 Well nourished, well developed, in no acute distress, appears stated age 58: normal Neck: JVD flat. Carotid bruit absent  Cardiac:  normal S1, S2; RRR; no murmur Lungs:  clear to auscultation bilaterally, no wheezing, rhonchi or rales Abd: soft, nontender, no hepatomegaly Ext: Edema trace bilateral. Pulses 2+ Skin: warm and dry Neuro:  CNs 2-12 intact, no focal abnormalities noted  EKG:  Not performed       Signed, Illene Labrador III, MD 10/11/2013 2:13 PM

## 2013-10-11 NOTE — Pre-Procedure Instructions (Signed)
[name]  10/11/2013   Your procedure is scheduled on:  Tues, Sept 22 @ 7:30 AM  Report to [facility] @ 5:30 AM  Call this number if you have problems the morning of surgery: [phone]   Remember:   Do not eat food or drink liquids after midnight.   Take these medicines the morning of surgery with A SIP OF WATER: Amlodipine(Norvasc) and Valium(Diazepam)              Stop taking your Coumadin 72 hours prior to surgery as indicated by Dr.H Tamala Julian. No Goody's,BC's,Aleve,Aspirin,Ibuprofen,Fish Oil,or any Herbal Medications   Do not wear jewelry, make-up or nail polish.  Do not wear lotions, powders, or perfumes. You may wear deodorant.  Do not shave 48 hours prior to surgery.   Do not bring valuables to the hospital.  [facility] is not responsible                  for any belongings or valuables.               Contacts, dentures or bridgework may not be worn into surgery.  Leave suitcase in the car. After surgery it may be brought to your room.  For patients admitted to the hospital, discharge time is determined by your                treatment team.             [facility] - Preparing for Surgery  Before surgery, you can play an important role.  Because skin is not sterile, your skin needs to be as free of germs as possible.  You can reduce the number of germs on you skin by washing with CHG (chlorahexidine gluconate) soap before surgery.  CHG is an antiseptic cleaner which kills germs and bonds with the skin to continue killing germs even after washing.  Please DO NOT use if you have an allergy to CHG or antibacterial soaps.  If your skin becomes reddened/irritated stop using the CHG and inform your nurse when you arrive at Short Stay.  Do not shave (including legs and underarms) for at least 48 hours prior to the first CHG shower.  You may shave your face.  Please follow these instructions carefully:   1.  Shower with CHG Soap the night before surgery and the                                 morning of Surgery.  2.  If you choose to wash your hair, wash your hair first as usual with your       normal shampoo.  3.  After you shampoo, rinse your hair and body thoroughly to remove the                      Shampoo.  4.  Use CHG as you would any other liquid soap.  You can apply chg directly       to the skin and wash gently with scrungie or a clean washcloth.  5.  Apply the CHG Soap to your body ONLY FROM THE NECK DOWN.        Do not use on open wounds or open sores.  Avoid contact with your eyes,       ears, mouth and genitals (private parts).  Wash genitals (private parts)  with your normal soap.  6.  Wash thoroughly, paying special attention to the area where your surgery        will be performed.  7.  Thoroughly rinse your body with warm water from the neck down.  8.  DO NOT shower/wash with your normal soap after using and rinsing off       the CHG Soap.  9.  Pat yourself dry with a clean towel.            10.  Wear clean pajamas.            11.  Place clean sheets on your bed the night of your first shower and do not        sleep with pets.  Day of Surgery  Do not apply any lotions/deoderants the morning of surgery.  Please wear clean clothes to the hospital/surgery center.    Special Instructions:    Please read over the following fact sheets that you were given: Pain Booklet, Coughing and Deep Breathing, Blood Transfusion Information, MRSA Information and Surgical Site Infection Prevention

## 2013-10-11 NOTE — Patient Instructions (Signed)
Your physician recommends that you continue on your current medications as directed. Please refer to the Current Medication list given to you today.  Your cardiac medications have been refilled today  Your physician wants you to follow-up in: 6-9 months with Dr.Smith You will receive a reminder letter in the mail two months in advance. If you don't receive a letter, please call our office to schedule the follow-up appointment.

## 2013-10-12 ENCOUNTER — Encounter (HOSPITAL_COMMUNITY)
Admission: RE | Admit: 2013-10-12 | Discharge: 2013-10-12 | Disposition: A | Discharge status: Home / Self Care | Payer: Medicare Other | Source: Ambulatory Visit | Attending: Orthopaedic Surgery | Admitting: Orthopaedic Surgery

## 2013-10-12 ENCOUNTER — Encounter (HOSPITAL_COMMUNITY): Payer: Self-pay

## 2013-10-12 ENCOUNTER — Other Ambulatory Visit: Payer: Self-pay

## 2013-10-12 DIAGNOSIS — Z7901 Long term (current) use of anticoagulants: Secondary | ICD-10-CM | POA: Insufficient documentation

## 2013-10-12 DIAGNOSIS — Z01812 Encounter for preprocedural laboratory examination: Secondary | ICD-10-CM | POA: Diagnosis not present

## 2013-10-12 DIAGNOSIS — M1710 Unilateral primary osteoarthritis, unspecified knee: Secondary | ICD-10-CM | POA: Diagnosis not present

## 2013-10-12 DIAGNOSIS — Z01810 Encounter for preprocedural cardiovascular examination: Secondary | ICD-10-CM | POA: Insufficient documentation

## 2013-10-12 DIAGNOSIS — I700 Atherosclerosis of aorta: Secondary | ICD-10-CM | POA: Diagnosis not present

## 2013-10-12 DIAGNOSIS — Z01818 Encounter for other preprocedural examination: Secondary | ICD-10-CM | POA: Diagnosis not present

## 2013-10-12 HISTORY — DX: Transient cerebral ischemic attack, unspecified: G45.9

## 2013-10-12 HISTORY — DX: Personal history of peptic ulcer disease: Z87.11

## 2013-10-12 HISTORY — DX: Diverticulosis of intestine, part unspecified, without perforation or abscess without bleeding: K57.90

## 2013-10-12 HISTORY — DX: Personal history of other diseases of the digestive system: Z87.19

## 2013-10-12 HISTORY — DX: Other chronic pain: G89.29

## 2013-10-12 HISTORY — DX: Personal history of colonic polyps: Z86.010

## 2013-10-12 HISTORY — DX: Presence of cardiac pacemaker: Z95.0

## 2013-10-12 HISTORY — DX: Unspecified glaucoma: H40.9

## 2013-10-12 HISTORY — DX: Other muscle spasm: M62.838

## 2013-10-12 HISTORY — DX: Personal history of colon polyps, unspecified: Z86.0100

## 2013-10-12 HISTORY — DX: Dizziness and giddiness: R42

## 2013-10-12 HISTORY — DX: Pain in unspecified joint: M25.50

## 2013-10-12 HISTORY — DX: Effusion, unspecified joint: M25.40

## 2013-10-12 HISTORY — DX: Personal history of other diseases of the musculoskeletal system and connective tissue: Z87.39

## 2013-10-12 HISTORY — DX: Dorsalgia, unspecified: M54.9

## 2013-10-12 LAB — CBC WITH DIFFERENTIAL/PLATELET
Basophils Absolute: 0 10*3/uL (ref 0.0–0.1)
Basophils Relative: 0 % (ref 0–1)
EOS PCT: 3 % (ref 0–5)
Eosinophils Absolute: 0.1 10*3/uL (ref 0.0–0.7)
HEMATOCRIT: 41.1 % (ref 36.0–46.0)
Hemoglobin: 13.2 g/dL (ref 12.0–15.0)
LYMPHS ABS: 1.6 10*3/uL (ref 0.7–4.0)
LYMPHS PCT: 30 % (ref 12–46)
MCH: 26.9 pg (ref 26.0–34.0)
MCHC: 32.1 g/dL (ref 30.0–36.0)
MCV: 83.9 fL (ref 78.0–100.0)
MONO ABS: 0.3 10*3/uL (ref 0.1–1.0)
Monocytes Relative: 6 % (ref 3–12)
NEUTROS ABS: 3.3 10*3/uL (ref 1.7–7.7)
Neutrophils Relative %: 61 % (ref 43–77)
PLATELETS: 226 10*3/uL (ref 150–400)
RBC: 4.9 MIL/uL (ref 3.87–5.11)
RDW: 14.2 % (ref 11.5–15.5)
WBC: 5.3 10*3/uL (ref 4.0–10.5)

## 2013-10-12 LAB — BASIC METABOLIC PANEL
ANION GAP: 11 (ref 5–15)
BUN: 7 mg/dL (ref 6–23)
CO2: 29 mEq/L (ref 19–32)
Calcium: 9.4 mg/dL (ref 8.4–10.5)
Chloride: 104 mEq/L (ref 96–112)
Creatinine, Ser: 0.79 mg/dL (ref 0.50–1.10)
GFR calc Af Amer: >90 mL/min (ref >90)
GFR calc non Af Amer: 78 mL/min — ABNORMAL LOW (ref >90)
Glucose, Bld: 108 mg/dL — ABNORMAL HIGH (ref 70–99)
POTASSIUM: 4 meq/L (ref 3.7–5.3)
SODIUM: 144 meq/L (ref 137–147)

## 2013-10-12 LAB — ABO/RH: ABO/RH(D): A POS

## 2013-10-12 LAB — TYPE AND SCREEN
ABO/RH(D): A POS
ANTIBODY SCREEN: NEGATIVE

## 2013-10-12 LAB — URINE MICROSCOPIC-ADD ON

## 2013-10-12 LAB — URINALYSIS, ROUTINE W REFLEX MICROSCOPIC
Bilirubin Urine: NEGATIVE
Glucose, UA: NEGATIVE mg/dL
Ketones, ur: NEGATIVE mg/dL
LEUKOCYTES UA: NEGATIVE
NITRITE: NEGATIVE
Protein, ur: NEGATIVE mg/dL
Specific Gravity, Urine: 1.01 (ref 1.005–1.030)
UROBILINOGEN UA: 0.2 mg/dL (ref 0.0–1.0)
pH: 7.5 (ref 5.0–8.0)

## 2013-10-12 LAB — PROTIME-INR
INR: 2.31 — ABNORMAL HIGH (ref 0.00–1.49)
PROTHROMBIN TIME: 25.4 s — AB (ref 11.6–15.2)

## 2013-10-12 LAB — SURGICAL PCR SCREEN
MRSA, PCR: NEGATIVE
Staphylococcus aureus: NEGATIVE

## 2013-10-12 LAB — APTT: APTT: 46 s — AB (ref 24–37)

## 2013-10-12 MED ORDER — VALSARTAN 320 MG PO TABS: 320.0000 mg | ORAL_TABLET | Freq: Every day | ORAL | Status: DC

## 2013-10-12 MED ORDER — CHLORHEXIDINE GLUCONATE 4 % EX LIQD: 60.0000 mL | Freq: Once | CUTANEOUS | Status: DC

## 2013-10-12 NOTE — Progress Notes (Addendum)
Cardiologist is Dr.H Tamala Julian with clearance note in epic from 10-11-13  EKG in epic from 03-08-13  Echo report in epic from 2011  Heart cath report in epic from 2005  Medical Md is Dr.William Kenton Kingfisher  Stress test done around 2005  Denies CXR in past yr

## 2013-10-13 NOTE — H&P (Signed)
TOTAL KNEE ADMISSION H&P  Patient is being admitted for right total knee arthroplasty.  Subjective:  Chief Complaint:right knee pain.  HPI: [name], 80 y.o. female, has a history of pain and functional disability in the right knee due to arthritis and has failed non-surgical conservative treatments for greater than 12 weeks to includeNSAID's and/or analgesics, corticosteriod injections, viscosupplementation injections, flexibility and strengthening excercises, use of assistive devices, weight reduction as appropriate and activity modification.  Onset of symptoms was gradual, starting 5 years ago with gradually worsening course since that time. The patient noted no past surgery on the right knee(s).  Patient currently rates pain in the right knee(s) at 10 out of 10 with activity. Patient has night pain, worsening of pain with activity and weight bearing, pain that interferes with activities of daily living, crepitus and joint swelling.  Patient has evidence of subchondral cysts, subchondral sclerosis, periarticular osteophytes and joint space narrowing by imaging studies.There is no active infection.  Patient Active Problem List   Diagnosis Date Noted  . Encounter for therapeutic drug monitoring 02/23/2013  . Pacemaker 02/23/2013  . Atrial fibrillation 12/03/2012  . Chronic diastolic heart failure 40/10/2723    Class: Chronic  . Long term (current) use of anticoagulants 11/03/2012    Class: Chronic  . Depression   . Stroke   . Paroxysmal a-fib 08/30/2012  . HTN (hypertension) 08/30/2012  . Gout 08/30/2012  . Abdominal pain, generalized 04/11/2011  . Special screening for malignant neoplasms, colon 04/11/2011   Past Medical History  Diagnosis Date  . Depression   . Tuberculosis     during childhood unsure of time  . Coronary atherosclerosis of native coronary artery   . Venous insufficiency   . Osteoarthritis   . Tachy-brady syndrome     With permanent pacemaker 04/03/99  .  PAF (paroxysmal atrial fibrillation)     takes Coumadin daily  . Lichen planus   . Hypertension     takes Amlodipine and Diovan daily  . Chronic diastolic CHF (congestive heart failure)     takes Bumex daily  . Anxiety     takes Valium daily as needed  . Muscle spasm     takes Celebrex daily and Robaxin daily as needed  . Heart murmur   . TIA (transient ischemic attack)   . Pacemaker   . History of bronchitis 2013  . Pneumonia     as a child  . Headache(784.0)     occasionally  . Dizziness     occasionally  . Joint pain   . Joint swelling   . Chronic back pain     stenosis  . History of gout     doesn't take any meds  . GERD (gastroesophageal reflux disease)     but doesn't take any meds  . History of gastric ulcer   . History of colon polyps   . Diverticulosis   . Diabetes mellitus without complication     but doesn't take any meds  . Glaucoma     uses eye drops daily    Past Surgical History  Procedure Laterality Date  . Tonsillectomy    . Tubal ligation  1971  . Cataract extraction Bilateral   . Pacemaker insertion  04/03/99    x 2  . Back surgery      2010  . Total abdominal hysterectomy w/ bilateral salpingoophorectomy    . Spinal stenosis surgery  08/2006  . Cardiac catheterization  2005    x  2  . Colonoscopy    . Esophagogastroduodenoscopy      No prescriptions prior to admission   Allergies  Allergen Reactions  . Lasix [Furosemide] Other (See Comments)    "Too strong"  . Levsin [Hyoscyamine Sulfate]   . Procardia [Nifedipine] Other (See Comments)    GI uspet  . Prednisone Palpitations and Other (See Comments)    Tachycardia, fluttering    History  Substance Use Topics  . Smoking status: Never Smoker   . Smokeless tobacco: Never Used  . Alcohol Use: No    Family History  Problem Relation Age of Onset  . Malignant hyperthermia Neg Hx   . Hypertension Mother   . Cancer Mother     Maglignant Brain Tumor  . Heart disease Father   .  Hypertension Father   . Heart failure Sister   . Diabetes Sister      Review of Systems  Musculoskeletal: Positive for joint pain.       Right knee  All other systems reviewed and are negative.   Objective:  Physical Exam  Constitutional: She is oriented to person, place, and time. She appears well-developed and well-nourished.  HENT:  Head: Normocephalic and atraumatic.  Eyes: Conjunctivae are normal. Pupils are equal, round, and reactive to light.  Neck: Normal range of motion.  Cardiovascular: Normal rate and intact distal pulses.   Respiratory: Effort normal.  GI: Soft.  Musculoskeletal:  Right knee has no effusion. Her motion is 0-100 at which point she has pain. She has pain lateral more than medial and some significant crepitation. There is a mild valgus deformity. Hip motion is full and pain free and SLR is negative on both sides.  There is no palpable LAD behind either knee.  Sensation and motor function are intact on both sides and there are palpable pulses on both sides.   Neurological: She is alert and oriented to person, place, and time.  Skin: Skin is warm and dry.  Psychiatric: She has a normal mood and affect. Her behavior is normal. Judgment and thought content normal.    Vital signs in last 24 hours:    Labs:   Estimated body mass index is 36.51 kg/(m^2) as calculated from the following:   Height as of 03/08/13: 5' 2.5" (1.588 m).   Weight as of 03/08/13: 92.08 kg (203 lb).   Imaging Review Plain radiographs demonstrate severe degenerative joint disease of the right knee(s). The overall alignment ismild valgus. The bone quality appears to be good for age and reported activity level.  Assessment/Plan:  End stage arthritis, right knee   The patient history, physical examination, clinical judgment of the provider and imaging studies are consistent with end stage degenerative joint disease of the right knee(s) and total knee arthroplasty is deemed medically  necessary. The treatment options including medical management, injection therapy arthroscopy and arthroplasty were discussed at length. The risks and benefits of total knee arthroplasty were presented and reviewed. The risks due to aseptic loosening, infection, stiffness, patella tracking problems, thromboembolic complications and other imponderables were discussed. The patient acknowledged the explanation, agreed to proceed with the plan and consent was signed. Patient is being admitted for inpatient treatment for surgery, pain control, PT, OT, prophylactic antibiotics, VTE prophylaxis, progressive ambulation and ADL's and discharge planning. The patient is planning to be discharged to skilled nursing facility

## 2013-10-14 DIAGNOSIS — R221 Localized swelling, mass and lump, neck: Secondary | ICD-10-CM | POA: Diagnosis not present

## 2013-10-14 DIAGNOSIS — R059 Cough, unspecified: Secondary | ICD-10-CM | POA: Diagnosis not present

## 2013-10-14 DIAGNOSIS — R220 Localized swelling, mass and lump, head: Secondary | ICD-10-CM | POA: Diagnosis not present

## 2013-10-14 DIAGNOSIS — R05 Cough: Secondary | ICD-10-CM | POA: Diagnosis not present

## 2013-10-15 ENCOUNTER — Encounter: Payer: Self-pay | Admitting: Internal Medicine

## 2013-10-18 MED ORDER — CEFAZOLIN SODIUM-DEXTROSE 2-3 GM-% IV SOLR
2.0000 g | INTRAVENOUS | Status: AC
  Administered 2013-10-19: 2 g via INTRAVENOUS
  Filled 2013-10-18: qty 50

## 2013-10-19 ENCOUNTER — Encounter (HOSPITAL_COMMUNITY): Payer: Self-pay | Admitting: *Deleted

## 2013-10-19 ENCOUNTER — Encounter (HOSPITAL_COMMUNITY): Payer: Medicare Other | Admitting: Anesthesiology

## 2013-10-19 ENCOUNTER — Encounter (HOSPITAL_COMMUNITY)
Admission: RE | Disposition: A | Discharge status: Skilled Nursing Facility | Payer: Self-pay | Source: Ambulatory Visit | Attending: Orthopaedic Surgery

## 2013-10-19 ENCOUNTER — Inpatient Hospital Stay (HOSPITAL_COMMUNITY)
Admission: RE | Admit: 2013-10-19 | Discharge: 2013-10-21 | DRG: 470 | Disposition: A | Discharge status: Skilled Nursing Facility | Payer: Medicare Other | Source: Ambulatory Visit | Attending: Orthopaedic Surgery | Admitting: Orthopaedic Surgery

## 2013-10-19 ENCOUNTER — Inpatient Hospital Stay (HOSPITAL_COMMUNITY): Payer: Medicare Other | Admitting: Anesthesiology

## 2013-10-19 DIAGNOSIS — I4891 Unspecified atrial fibrillation: Secondary | ICD-10-CM | POA: Diagnosis present

## 2013-10-19 DIAGNOSIS — I2510 Atherosclerotic heart disease of native coronary artery without angina pectoris: Secondary | ICD-10-CM | POA: Diagnosis not present

## 2013-10-19 DIAGNOSIS — Z6836 Body mass index (BMI) 36.0-36.9, adult: Secondary | ICD-10-CM

## 2013-10-19 DIAGNOSIS — R269 Unspecified abnormalities of gait and mobility: Secondary | ICD-10-CM | POA: Diagnosis not present

## 2013-10-19 DIAGNOSIS — F3289 Other specified depressive episodes: Secondary | ICD-10-CM | POA: Diagnosis present

## 2013-10-19 DIAGNOSIS — M6281 Muscle weakness (generalized): Secondary | ICD-10-CM | POA: Diagnosis not present

## 2013-10-19 DIAGNOSIS — F411 Generalized anxiety disorder: Secondary | ICD-10-CM | POA: Diagnosis present

## 2013-10-19 DIAGNOSIS — H409 Unspecified glaucoma: Secondary | ICD-10-CM | POA: Diagnosis not present

## 2013-10-19 DIAGNOSIS — M109 Gout, unspecified: Secondary | ICD-10-CM | POA: Diagnosis present

## 2013-10-19 DIAGNOSIS — I10 Essential (primary) hypertension: Secondary | ICD-10-CM | POA: Diagnosis present

## 2013-10-19 DIAGNOSIS — M1710 Unilateral primary osteoarthritis, unspecified knee: Secondary | ICD-10-CM | POA: Diagnosis not present

## 2013-10-19 DIAGNOSIS — Z950 Presence of cardiac pacemaker: Secondary | ICD-10-CM

## 2013-10-19 DIAGNOSIS — K5900 Constipation, unspecified: Secondary | ICD-10-CM | POA: Diagnosis not present

## 2013-10-19 DIAGNOSIS — G8918 Other acute postprocedural pain: Secondary | ICD-10-CM | POA: Diagnosis not present

## 2013-10-19 DIAGNOSIS — E119 Type 2 diabetes mellitus without complications: Secondary | ICD-10-CM | POA: Diagnosis present

## 2013-10-19 DIAGNOSIS — M62838 Other muscle spasm: Secondary | ICD-10-CM | POA: Diagnosis present

## 2013-10-19 DIAGNOSIS — M25569 Pain in unspecified knee: Secondary | ICD-10-CM | POA: Diagnosis not present

## 2013-10-19 DIAGNOSIS — I5032 Chronic diastolic (congestive) heart failure: Secondary | ICD-10-CM | POA: Diagnosis present

## 2013-10-19 DIAGNOSIS — Z7901 Long term (current) use of anticoagulants: Secondary | ICD-10-CM | POA: Diagnosis not present

## 2013-10-19 DIAGNOSIS — S99919A Unspecified injury of unspecified ankle, initial encounter: Secondary | ICD-10-CM | POA: Diagnosis not present

## 2013-10-19 DIAGNOSIS — E669 Obesity, unspecified: Secondary | ICD-10-CM | POA: Diagnosis present

## 2013-10-19 DIAGNOSIS — M1711 Unilateral primary osteoarthritis, right knee: Secondary | ICD-10-CM | POA: Diagnosis present

## 2013-10-19 DIAGNOSIS — Z8673 Personal history of transient ischemic attack (TIA), and cerebral infarction without residual deficits: Secondary | ICD-10-CM | POA: Diagnosis not present

## 2013-10-19 DIAGNOSIS — F329 Major depressive disorder, single episode, unspecified: Secondary | ICD-10-CM | POA: Diagnosis present

## 2013-10-19 DIAGNOSIS — K219 Gastro-esophageal reflux disease without esophagitis: Secondary | ICD-10-CM | POA: Diagnosis present

## 2013-10-19 DIAGNOSIS — Z471 Aftercare following joint replacement surgery: Secondary | ICD-10-CM | POA: Diagnosis not present

## 2013-10-19 DIAGNOSIS — R279 Unspecified lack of coordination: Secondary | ICD-10-CM | POA: Diagnosis not present

## 2013-10-19 DIAGNOSIS — S8990XA Unspecified injury of unspecified lower leg, initial encounter: Secondary | ICD-10-CM | POA: Diagnosis not present

## 2013-10-19 DIAGNOSIS — Z96659 Presence of unspecified artificial knee joint: Secondary | ICD-10-CM | POA: Diagnosis not present

## 2013-10-19 HISTORY — PX: TOTAL KNEE ARTHROPLASTY: SHX125

## 2013-10-19 HISTORY — DX: Type 2 diabetes mellitus without complications: E11.9

## 2013-10-19 LAB — CBC
HEMATOCRIT: 39.6 % (ref 36.0–46.0)
HEMOGLOBIN: 12.9 g/dL (ref 12.0–15.0)
MCH: 27.9 pg (ref 26.0–34.0)
MCHC: 32.6 g/dL (ref 30.0–36.0)
MCV: 85.5 fL (ref 78.0–100.0)
Platelets: 232 10*3/uL (ref 150–400)
RBC: 4.63 MIL/uL (ref 3.87–5.11)
RDW: 14.2 % (ref 11.5–15.5)
WBC: 12.3 10*3/uL — ABNORMAL HIGH (ref 4.0–10.5)

## 2013-10-19 LAB — CREATININE, SERUM
Creatinine, Ser: 0.82 mg/dL (ref 0.50–1.10)
GFR calc Af Amer: 77 mL/min — ABNORMAL LOW (ref >90)
GFR, EST NON AFRICAN AMERICAN: 67 mL/min — AB (ref >90)

## 2013-10-19 LAB — GLUCOSE, CAPILLARY
GLUCOSE-CAPILLARY: 151 mg/dL — AB (ref 70–99)
GLUCOSE-CAPILLARY: 151 mg/dL — AB (ref 70–99)
Glucose-Capillary: 118 mg/dL — ABNORMAL HIGH (ref 70–99)
Glucose-Capillary: 138 mg/dL — ABNORMAL HIGH (ref 70–99)

## 2013-10-19 LAB — PROTIME-INR
INR: 1.22 (ref 0.00–1.49)
Prothrombin Time: 15.4 seconds — ABNORMAL HIGH (ref 11.6–15.2)

## 2013-10-19 LAB — APTT: aPTT: 33 seconds (ref 24–37)

## 2013-10-19 SURGERY — ARTHROPLASTY, KNEE, TOTAL
Anesthesia: General | Laterality: Right

## 2013-10-19 MED ORDER — DIAZEPAM 5 MG PO TABS: 5.0000 mg | ORAL_TABLET | Freq: Every day | ORAL | Status: DC | PRN

## 2013-10-19 MED ORDER — LACTATED RINGERS IV SOLN
INTRAVENOUS | Status: DC | PRN
  Administered 2013-10-19 (×2): via INTRAVENOUS

## 2013-10-19 MED ORDER — MIDAZOLAM HCL 5 MG/5ML IJ SOLN
INTRAMUSCULAR | Status: DC | PRN
  Administered 2013-10-19 (×2): 1 mg via INTRAVENOUS

## 2013-10-19 MED ORDER — ONDANSETRON HCL 4 MG/2ML IJ SOLN
INTRAMUSCULAR | Status: AC
Start: 2013-10-19 — End: 2013-10-19
  Filled 2013-10-19: qty 2

## 2013-10-19 MED ORDER — VITAMIN C 500 MG PO TABS
500.0000 mg | ORAL_TABLET | Freq: Every day | ORAL | Status: DC
  Administered 2013-10-20 – 2013-10-21 (×2): 500 mg via ORAL
  Filled 2013-10-19 (×2): qty 1

## 2013-10-19 MED ORDER — EPHEDRINE SULFATE 50 MG/ML IJ SOLN
INTRAMUSCULAR | Status: AC
  Filled 2013-10-19: qty 1

## 2013-10-19 MED ORDER — HYDROMORPHONE HCL 1 MG/ML IJ SOLN
0.5000 mg | INTRAMUSCULAR | Status: DC | PRN
  Administered 2013-10-19: 1 mg via INTRAVENOUS
  Filled 2013-10-19: qty 1

## 2013-10-19 MED ORDER — METOCLOPRAMIDE HCL 10 MG PO TABS: 5.0000 mg | ORAL_TABLET | Freq: Three times a day (TID) | ORAL | Status: DC | PRN

## 2013-10-19 MED ORDER — FENTANYL CITRATE 0.05 MG/ML IJ SOLN
INTRAMUSCULAR | Status: DC | PRN
  Administered 2013-10-19 (×2): 50 ug via INTRAVENOUS
  Administered 2013-10-19: 150 ug via INTRAVENOUS
  Administered 2013-10-19 (×5): 50 ug via INTRAVENOUS

## 2013-10-19 MED ORDER — ONDANSETRON HCL 4 MG/2ML IJ SOLN
INTRAMUSCULAR | Status: DC | PRN
Start: 2013-10-19 — End: 2013-10-19
  Administered 2013-10-19: 4 mg via INTRAVENOUS

## 2013-10-19 MED ORDER — CEFAZOLIN SODIUM-DEXTROSE 2-3 GM-% IV SOLR
2.0000 g | Freq: Four times a day (QID) | INTRAVENOUS | Status: AC
  Administered 2013-10-19 (×2): 2 g via INTRAVENOUS
  Filled 2013-10-19 (×4): qty 50

## 2013-10-19 MED ORDER — ROCURONIUM BROMIDE 100 MG/10ML IV SOLN
INTRAVENOUS | Status: DC | PRN
  Administered 2013-10-19: 40 mg via INTRAVENOUS

## 2013-10-19 MED ORDER — BUMETANIDE 0.5 MG PO TABS
0.5000 mg | ORAL_TABLET | Freq: Every day | ORAL | Status: DC
  Administered 2013-10-19 – 2013-10-21 (×3): 0.5 mg via ORAL
  Filled 2013-10-19 (×4): qty 1

## 2013-10-19 MED ORDER — FENTANYL CITRATE 0.05 MG/ML IJ SOLN
INTRAMUSCULAR | Status: AC
  Filled 2013-10-19: qty 5

## 2013-10-19 MED ORDER — SUCCINYLCHOLINE CHLORIDE 20 MG/ML IJ SOLN
INTRAMUSCULAR | Status: AC
  Filled 2013-10-19: qty 1

## 2013-10-19 MED ORDER — STERILE WATER FOR INJECTION IJ SOLN
INTRAMUSCULAR | Status: AC
  Filled 2013-10-19: qty 10

## 2013-10-19 MED ORDER — METHOCARBAMOL 1000 MG/10ML IJ SOLN
500.0000 mg | Freq: Four times a day (QID) | INTRAMUSCULAR | Status: DC | PRN
  Filled 2013-10-19: qty 5

## 2013-10-19 MED ORDER — WARFARIN - PHARMACIST DOSING INPATIENT: Freq: Every day | Status: DC

## 2013-10-19 MED ORDER — OXYCODONE HCL 5 MG/5ML PO SOLN: 5.0000 mg | Freq: Once | ORAL | Status: AC | PRN

## 2013-10-19 MED ORDER — PROPOFOL 10 MG/ML IV BOLUS
INTRAVENOUS | Status: AC
  Filled 2013-10-19: qty 20

## 2013-10-19 MED ORDER — LACTATED RINGERS IV SOLN: INTRAVENOUS | Status: DC

## 2013-10-19 MED ORDER — SODIUM CHLORIDE 0.9 % IJ SOLN
INTRAMUSCULAR | Status: DC | PRN
  Administered 2013-10-19: 20 mL

## 2013-10-19 MED ORDER — IRBESARTAN 300 MG PO TABS
300.0000 mg | ORAL_TABLET | Freq: Every day | ORAL | Status: DC
  Administered 2013-10-19 – 2013-10-21 (×3): 300 mg via ORAL
  Filled 2013-10-19 (×3): qty 1

## 2013-10-19 MED ORDER — MIDAZOLAM HCL 2 MG/2ML IJ SOLN
INTRAMUSCULAR | Status: AC
  Filled 2013-10-19: qty 2

## 2013-10-19 MED ORDER — HYDROCODONE-ACETAMINOPHEN 5-325 MG PO TABS
ORAL_TABLET | ORAL | Status: AC
  Filled 2013-10-19: qty 2

## 2013-10-19 MED ORDER — PROMETHAZINE HCL 25 MG/ML IJ SOLN: 6.2500 mg | INTRAMUSCULAR | Status: DC | PRN

## 2013-10-19 MED ORDER — MENTHOL 3 MG MT LOZG: 1.0000 | LOZENGE | OROMUCOSAL | Status: DC | PRN

## 2013-10-19 MED ORDER — HYDROCODONE-ACETAMINOPHEN 5-325 MG PO TABS
1.0000 | ORAL_TABLET | ORAL | Status: DC | PRN
  Administered 2013-10-19 – 2013-10-21 (×11): 2 via ORAL
  Filled 2013-10-19 (×10): qty 2

## 2013-10-19 MED ORDER — ROCURONIUM BROMIDE 50 MG/5ML IV SOLN
INTRAVENOUS | Status: AC
  Filled 2013-10-19: qty 1

## 2013-10-19 MED ORDER — PROPOFOL 10 MG/ML IV BOLUS
INTRAVENOUS | Status: DC | PRN
  Administered 2013-10-19: 200 mg via INTRAVENOUS

## 2013-10-19 MED ORDER — HYDROMORPHONE HCL 1 MG/ML IJ SOLN
0.2500 mg | INTRAMUSCULAR | Status: DC | PRN
  Administered 2013-10-19: 0.5 mg via INTRAVENOUS

## 2013-10-19 MED ORDER — DIPHENHYDRAMINE HCL 12.5 MG/5ML PO ELIX: 12.5000 mg | ORAL_SOLUTION | ORAL | Status: DC | PRN

## 2013-10-19 MED ORDER — MEPERIDINE HCL 25 MG/ML IJ SOLN: 6.2500 mg | INTRAMUSCULAR | Status: DC | PRN

## 2013-10-19 MED ORDER — DIPHENHYDRAMINE HCL 50 MG/ML IJ SOLN
INTRAMUSCULAR | Status: DC | PRN
  Administered 2013-10-19: 10 mg via INTRAVENOUS

## 2013-10-19 MED ORDER — POTASSIUM CHLORIDE CRYS ER 20 MEQ PO TBCR
20.0000 meq | EXTENDED_RELEASE_TABLET | Freq: Every day | ORAL | Status: DC
  Administered 2013-10-19 – 2013-10-21 (×3): 20 meq via ORAL
  Filled 2013-10-19 (×3): qty 1

## 2013-10-19 MED ORDER — ACETAMINOPHEN 650 MG RE SUPP: 650.0000 mg | Freq: Four times a day (QID) | RECTAL | Status: DC | PRN

## 2013-10-19 MED ORDER — SODIUM CHLORIDE 0.9 % IR SOLN
Status: DC | PRN
  Administered 2013-10-19: 1000 mL

## 2013-10-19 MED ORDER — BUPIVACAINE-EPINEPHRINE (PF) 0.5% -1:200000 IJ SOLN
INTRAMUSCULAR | Status: DC | PRN
  Administered 2013-10-19: 30 mL via PERINEURAL

## 2013-10-19 MED ORDER — ARTIFICIAL TEARS OP OINT
TOPICAL_OINTMENT | OPHTHALMIC | Status: DC | PRN
  Administered 2013-10-19: 1 via OPHTHALMIC

## 2013-10-19 MED ORDER — ALUM & MAG HYDROXIDE-SIMETH 200-200-20 MG/5ML PO SUSP: 30.0000 mL | ORAL | Status: DC | PRN

## 2013-10-19 MED ORDER — WARFARIN SODIUM 7.5 MG PO TABS
7.5000 mg | ORAL_TABLET | Freq: Once | ORAL | Status: AC
  Administered 2013-10-19: 7.5 mg via ORAL
  Filled 2013-10-19: qty 1

## 2013-10-19 MED ORDER — ADULT MULTIVITAMIN W/MINERALS CH
1.0000 | ORAL_TABLET | Freq: Every day | ORAL | Status: DC
  Administered 2013-10-20 – 2013-10-21 (×2): 1 via ORAL
  Filled 2013-10-19 (×2): qty 1

## 2013-10-19 MED ORDER — ONDANSETRON HCL 4 MG PO TABS: 4.0000 mg | ORAL_TABLET | Freq: Four times a day (QID) | ORAL | Status: DC | PRN

## 2013-10-19 MED ORDER — CALCIUM CARBONATE 600 MG PO TABS: 600.0000 mg | ORAL_TABLET | Freq: Every day | ORAL | Status: DC

## 2013-10-19 MED ORDER — ACETAMINOPHEN 325 MG PO TABS
650.0000 mg | ORAL_TABLET | Freq: Four times a day (QID) | ORAL | Status: DC | PRN
  Administered 2013-10-21: 650 mg via ORAL
  Filled 2013-10-19: qty 2

## 2013-10-19 MED ORDER — GLYCOPYRROLATE 0.2 MG/ML IJ SOLN
INTRAMUSCULAR | Status: DC | PRN
  Administered 2013-10-19: .6 mg via INTRAVENOUS

## 2013-10-19 MED ORDER — HYDROMORPHONE HCL 1 MG/ML IJ SOLN
INTRAMUSCULAR | Status: AC
  Filled 2013-10-19: qty 1

## 2013-10-19 MED ORDER — INFLUENZA VAC SPLIT QUAD 0.5 ML IM SUSY
0.5000 mL | PREFILLED_SYRINGE | INTRAMUSCULAR | Status: AC
  Administered 2013-10-20: 0.5 mL via INTRAMUSCULAR
  Filled 2013-10-19: qty 0.5

## 2013-10-19 MED ORDER — ENOXAPARIN SODIUM 30 MG/0.3ML ~~LOC~~ SOLN
30.0000 mg | Freq: Two times a day (BID) | SUBCUTANEOUS | Status: DC
  Administered 2013-10-19 – 2013-10-21 (×4): 30 mg via SUBCUTANEOUS
  Filled 2013-10-19 (×5): qty 0.3

## 2013-10-19 MED ORDER — METOCLOPRAMIDE HCL 5 MG/ML IJ SOLN: 5.0000 mg | Freq: Three times a day (TID) | INTRAMUSCULAR | Status: DC | PRN

## 2013-10-19 MED ORDER — BISACODYL 5 MG PO TBEC: 5.0000 mg | DELAYED_RELEASE_TABLET | Freq: Every day | ORAL | Status: DC | PRN

## 2013-10-19 MED ORDER — LIDOCAINE HCL (CARDIAC) 20 MG/ML IV SOLN
INTRAVENOUS | Status: AC
  Filled 2013-10-19: qty 5

## 2013-10-19 MED ORDER — ONDANSETRON HCL 4 MG/2ML IJ SOLN: 4.0000 mg | Freq: Four times a day (QID) | INTRAMUSCULAR | Status: DC | PRN

## 2013-10-19 MED ORDER — BUPIVACAINE LIPOSOME 1.3 % IJ SUSP
INTRAMUSCULAR | Status: DC | PRN
  Administered 2013-10-19: 20 mL

## 2013-10-19 MED ORDER — BUPIVACAINE LIPOSOME 1.3 % IJ SUSP
20.0000 mL | INTRAMUSCULAR | Status: DC
  Filled 2013-10-19: qty 20

## 2013-10-19 MED ORDER — LATANOPROST 0.005 % OP SOLN
1.0000 [drp] | Freq: Every day | OPHTHALMIC | Status: DC
  Administered 2013-10-20 (×2): 1 [drp] via OPHTHALMIC
  Filled 2013-10-19: qty 2.5

## 2013-10-19 MED ORDER — METHOCARBAMOL 500 MG PO TABS
500.0000 mg | ORAL_TABLET | Freq: Four times a day (QID) | ORAL | Status: DC | PRN
  Administered 2013-10-19 – 2013-10-21 (×5): 500 mg via ORAL
  Filled 2013-10-19 (×5): qty 1

## 2013-10-19 MED ORDER — DEXAMETHASONE SODIUM PHOSPHATE 10 MG/ML IJ SOLN
INTRAMUSCULAR | Status: DC | PRN
  Administered 2013-10-19: 10 mg via INTRAVENOUS

## 2013-10-19 MED ORDER — NEOSTIGMINE METHYLSULFATE 10 MG/10ML IV SOLN
INTRAVENOUS | Status: DC | PRN
  Administered 2013-10-19: 4.5 mg via INTRAVENOUS

## 2013-10-19 MED ORDER — OXYCODONE HCL 5 MG PO TABS
5.0000 mg | ORAL_TABLET | Freq: Once | ORAL | Status: AC | PRN
  Administered 2013-10-19: 5 mg via ORAL

## 2013-10-19 MED ORDER — SENNOSIDES 8.6 MG PO TABS
2.0000 | ORAL_TABLET | Freq: Every day | ORAL | Status: DC
  Administered 2013-10-19 – 2013-10-20 (×2): 17.2 mg via ORAL
  Filled 2013-10-19 (×3): qty 2

## 2013-10-19 MED ORDER — LACTATED RINGERS IV SOLN
INTRAVENOUS | Status: DC
  Administered 2013-10-19 – 2013-10-20 (×2): via INTRAVENOUS

## 2013-10-19 MED ORDER — ENOXAPARIN SODIUM 30 MG/0.3ML ~~LOC~~ SOLN: 30.0000 mg | Freq: Two times a day (BID) | SUBCUTANEOUS | Status: DC

## 2013-10-19 MED ORDER — CALCIUM CARBONATE 1250 (500 CA) MG PO TABS
500.0000 mg | ORAL_TABLET | Freq: Every day | ORAL | Status: DC
  Administered 2013-10-20 – 2013-10-21 (×2): 500 mg via ORAL
  Filled 2013-10-19 (×3): qty 1

## 2013-10-19 MED ORDER — LIDOCAINE HCL (CARDIAC) 20 MG/ML IV SOLN
INTRAVENOUS | Status: DC | PRN
  Administered 2013-10-19: 20 mg via INTRAVENOUS

## 2013-10-19 MED ORDER — AMLODIPINE BESYLATE 5 MG PO TABS
5.0000 mg | ORAL_TABLET | Freq: Every day | ORAL | Status: DC
  Administered 2013-10-20 – 2013-10-21 (×2): 5 mg via ORAL
  Filled 2013-10-19 (×2): qty 1

## 2013-10-19 MED ORDER — MIDAZOLAM HCL 2 MG/2ML IJ SOLN: 0.5000 mg | Freq: Once | INTRAMUSCULAR | Status: DC | PRN

## 2013-10-19 MED ORDER — PHENYLEPHRINE HCL 10 MG/ML IJ SOLN
INTRAMUSCULAR | Status: DC | PRN
  Administered 2013-10-19 (×2): 80 ug via INTRAVENOUS

## 2013-10-19 MED ORDER — ARTIFICIAL TEARS OP OINT
TOPICAL_OINTMENT | OPHTHALMIC | Status: AC
  Filled 2013-10-19: qty 3.5

## 2013-10-19 MED ORDER — PHENOL 1.4 % MT LIQD: 1.0000 | OROMUCOSAL | Status: DC | PRN

## 2013-10-19 MED ORDER — OXYCODONE HCL 5 MG PO TABS
ORAL_TABLET | ORAL | Status: AC
  Filled 2013-10-19: qty 1

## 2013-10-19 SURGICAL SUPPLY — 68 items
BANDAGE ELASTIC 4 VELCRO ST LF (GAUZE/BANDAGES/DRESSINGS) ×3 IMPLANT
BANDAGE ELASTIC 6 VELCRO ST LF (GAUZE/BANDAGES/DRESSINGS) ×2 IMPLANT
BANDAGE ESMARK 6X9 LF (GAUZE/BANDAGES/DRESSINGS) ×1 IMPLANT
BLADE SAG 18X100X1.27 (BLADE) IMPLANT
BLADE SAGITTAL 13X1.27X60 (BLADE) IMPLANT
BLADE SAGITTAL 13X1.27X60MM (BLADE)
BLADE SAGITTAL 25.0X1.19X90 (BLADE) IMPLANT
BLADE SAGITTAL 25.0X1.19X90MM (BLADE)
BLADE SURG 10 STRL SS (BLADE) ×2 IMPLANT
BLADE SURG ROTATE 9660 (MISCELLANEOUS) IMPLANT
BNDG CMPR 9X6 STRL LF SNTH (GAUZE/BANDAGES/DRESSINGS) ×1
BNDG CMPR MED 10X6 ELC LF (GAUZE/BANDAGES/DRESSINGS) ×1
BNDG ELASTIC 6X10 VLCR STRL LF (GAUZE/BANDAGES/DRESSINGS) ×3 IMPLANT
BNDG ESMARK 6X9 LF (GAUZE/BANDAGES/DRESSINGS) ×3
BNDG GAUZE ELAST 4 BULKY (GAUZE/BANDAGES/DRESSINGS) ×4 IMPLANT
BOWL SMART MIX CTS (DISPOSABLE) ×3 IMPLANT
CAPT RP KNEE ×2 IMPLANT
CEMENT HV SMART SET (Cement) ×6 IMPLANT
COVER SURGICAL LIGHT HANDLE (MISCELLANEOUS) ×3 IMPLANT
CUFF TOURNIQUET SINGLE 34IN LL (TOURNIQUET CUFF) ×3 IMPLANT
CUFF TOURNIQUET SINGLE 44IN (TOURNIQUET CUFF) IMPLANT
DRAPE EXTREMITY T 121X128X90 (DRAPE) ×3 IMPLANT
DRAPE PROXIMA HALF (DRAPES) ×3 IMPLANT
DRAPE U-SHAPE 47X51 STRL (DRAPES) ×3 IMPLANT
DRSG ADAPTIC 3X8 NADH LF (GAUZE/BANDAGES/DRESSINGS) ×3 IMPLANT
DRSG PAD ABDOMINAL 8X10 ST (GAUZE/BANDAGES/DRESSINGS) ×3 IMPLANT
DURAPREP 26ML APPLICATOR (WOUND CARE) ×3 IMPLANT
ELECT REM PT RETURN 9FT ADLT (ELECTROSURGICAL) ×3
ELECTRODE REM PT RTRN 9FT ADLT (ELECTROSURGICAL) ×1 IMPLANT
GAUZE SPONGE 4X4 12PLY STRL (GAUZE/BANDAGES/DRESSINGS) ×3 IMPLANT
GLOVE BIO SURGEON STRL SZ8 (GLOVE) ×6 IMPLANT
GLOVE BIOGEL PI IND STRL 8 (GLOVE) ×2 IMPLANT
GLOVE BIOGEL PI INDICATOR 8 (GLOVE) ×4
GOWN STRL REUS W/ TWL LRG LVL3 (GOWN DISPOSABLE) ×1 IMPLANT
GOWN STRL REUS W/ TWL XL LVL3 (GOWN DISPOSABLE) ×2 IMPLANT
GOWN STRL REUS W/TWL LRG LVL3 (GOWN DISPOSABLE) ×3
GOWN STRL REUS W/TWL XL LVL3 (GOWN DISPOSABLE) ×6
HANDPIECE INTERPULSE COAX TIP (DISPOSABLE) ×3
HOOD PEEL AWAY FACE SHEILD DIS (HOOD) ×6 IMPLANT
IMMOBILIZER KNEE 20 (SOFTGOODS) IMPLANT
IMMOBILIZER KNEE 22 UNIV (SOFTGOODS) ×3 IMPLANT
IMMOBILIZER KNEE 24 THIGH 36 (MISCELLANEOUS) IMPLANT
IMMOBILIZER KNEE 24 UNIV (MISCELLANEOUS)
KIT BASIN OR (CUSTOM PROCEDURE TRAY) ×3 IMPLANT
KIT ROOM TURNOVER OR (KITS) ×3 IMPLANT
MANIFOLD NEPTUNE II (INSTRUMENTS) ×3 IMPLANT
NDL HYPO 21X1 ECLIPSE (NEEDLE) ×1 IMPLANT
NEEDLE HYPO 21X1 ECLIPSE (NEEDLE) ×3 IMPLANT
NEEDLE HYPO 22GX1.5 SAFETY (NEEDLE) ×2 IMPLANT
NS IRRIG 1000ML POUR BTL (IV SOLUTION) ×3 IMPLANT
PACK TOTAL JOINT (CUSTOM PROCEDURE TRAY) ×3 IMPLANT
PAD ABD 8X10 STRL (GAUZE/BANDAGES/DRESSINGS) ×2 IMPLANT
PAD ARMBOARD 7.5X6 YLW CONV (MISCELLANEOUS) ×6 IMPLANT
SET HNDPC FAN SPRY TIP SCT (DISPOSABLE) ×1 IMPLANT
SPONGE GAUZE 4X4 12PLY STER LF (GAUZE/BANDAGES/DRESSINGS) ×2 IMPLANT
STAPLER VISISTAT 35W (STAPLE) IMPLANT
SUCTION FRAZIER TIP 10 FR DISP (SUCTIONS) IMPLANT
SUT MNCRL AB 3-0 PS2 18 (SUTURE) IMPLANT
SUT VIC AB 0 CT1 27 (SUTURE) ×6
SUT VIC AB 0 CT1 27XBRD ANBCTR (SUTURE) ×2 IMPLANT
SUT VIC AB 2-0 CT1 27 (SUTURE) ×6
SUT VIC AB 2-0 CT1 TAPERPNT 27 (SUTURE) ×2 IMPLANT
SUT VLOC 180 0 24IN GS25 (SUTURE) ×3 IMPLANT
SYR 50ML LL SCALE MARK (SYRINGE) ×3 IMPLANT
TOWEL OR 17X24 6PK STRL BLUE (TOWEL DISPOSABLE) ×3 IMPLANT
TOWEL OR 17X26 10 PK STRL BLUE (TOWEL DISPOSABLE) ×3 IMPLANT
TRAY FOLEY CATH 14FR (SET/KITS/TRAYS/PACK) IMPLANT
WATER STERILE IRR 1000ML POUR (IV SOLUTION) ×6 IMPLANT

## 2013-10-19 NOTE — Progress Notes (Signed)
ANTICOAGULATION CONSULT NOTE - Initial Consult  Pharmacy Consult for coumadin Indication: VTE prophylaxis and hx of Afib  Allergies  Allergen Reactions  . Lasix [Furosemide] Other (See Comments)    "Too strong"  . Levsin [Hyoscyamine Sulfate]   . Procardia [Nifedipine] Other (See Comments)    GI uspet  . Prednisone Palpitations and Other (See Comments)    Tachycardia, fluttering    Patient Measurements: Weight: 200 lb (90.719 kg)  Vital Signs: Temp: 98.4 F (36.9 C) (09/22 1200) Temp src: Oral (09/22 0558) BP: 159/74 mmHg (09/22 1200) Pulse Rate: 78 (09/22 1200)  Labs:  Recent Labs  10/19/13 0544  APTT 33  LABPROT 15.4*  INR 1.22    The CrCl is unknown because both a height and weight (above a minimum accepted value) are required for this calculation.   Medical History: Past Medical History  Diagnosis Date  . Depression   . Tuberculosis     during childhood unsure of time  . Coronary atherosclerosis of native coronary artery   . Venous insufficiency   . Osteoarthritis   . Tachy-brady syndrome     With permanent pacemaker 04/03/99  . PAF (paroxysmal atrial fibrillation)     takes Coumadin daily  . Lichen planus   . Hypertension     takes Amlodipine and Diovan daily  . Chronic diastolic CHF (congestive heart failure)     takes Bumex daily  . Anxiety     takes Valium daily as needed  . Muscle spasm     takes Celebrex daily and Robaxin daily as needed  . Heart murmur   . TIA (transient ischemic attack)   . Pacemaker   . History of bronchitis 2013  . Pneumonia     as a child  . Headache(784.0)     occasionally  . Dizziness     occasionally  . Joint pain   . Joint swelling   . Chronic back pain     stenosis  . History of gout     doesn't take any meds  . GERD (gastroesophageal reflux disease)     but doesn't take any meds  . History of gastric ulcer   . History of colon polyps   . Diverticulosis   . Diabetes mellitus without complication    but doesn't take any meds  . Glaucoma     uses eye drops daily    Medications:  See med rec  Assessment: Patient is a 80 y.o F s/p right TKA and on coumadin PTA for afib.  Patient informed me that her home regimen was 5 mg daily except 7.5mg  on MWF.  However, outpt [facility] clinic note said that her regimen should be 7.5mg  daily except 5mg  on TTSun.  Goal of Therapy:  INR 2-3    Plan:  1) coumadin 7.5mg  PO x1 today  Gordon Carlson P 10/19/2013,12:40 PM

## 2013-10-19 NOTE — Evaluation (Signed)
Physical Therapy Evaluation Patient Details Name: [name] MRN: [id number] DOB: [birth year] Today's Date: 10/19/2013   History of Present Illness  80 y.o. female s/p right total knee arthroplasty. Hx of a-fib with pacemaker placement, chronic diastolic heart failure, and stroke.  Clinical Impression  Pt is s/p right TKA presenting with the deficits listed below (see PT Problem List). Intermittent Rt knee instability with knee immobilizer in place during transfer and ambulation. Requires Min assist for most mobility at this time. Pt will benefit from skilled PT to increase their independence and safety with mobility to allow discharge to the venue listed below.    Follow Up Recommendations SNF;Supervision for mobility/OOB    Equipment Recommendations  Rolling walker with 5" wheels;3in1 (PT)    Recommendations for Other Services       Precautions / Restrictions Precautions Precautions: Knee Precaution Comments: Reviewed knee precautions Required Braces or Orthoses: Knee Immobilizer - Right Restrictions Weight Bearing Restrictions: Yes RLE Weight Bearing: Weight bearing as tolerated      Mobility  Bed Mobility Overal bed mobility: Needs Assistance Bed Mobility: Supine to Sit     Supine to sit: Min guard;HOB elevated     General bed mobility comments: Min guard with HOB elevated for safety with VC for technique. Moderate use of bed rail.  Transfers Overall transfer level: Needs assistance Equipment used: Rolling walker (2 wheeled) Transfers: Sit to/from Stand Sit to Stand: Min assist         General transfer comment: Min assist for boost to stand from lowest bed setting. VC for hand placement. required 2 attempts. Moderate knee instability upon standing with knee immobilizer in place, min assist for stability.  Ambulation/Gait Ambulation/Gait assistance: Min assist Ambulation Distance (Feet): 8 Feet Assistive device: Rolling walker (2 wheeled) Gait  Pattern/deviations: Step-to pattern;Decreased step length - left;Decreased stance time - right;Antalgic   Gait velocity interpretation: Below normal speed for age/gender General Gait Details: Educated on safe DME use with rolling walker. VC for walker placement and to extend Rt knee in stance phase for quad activation. Mild instability of Rt knee upon standing and when backing up to chair but able to correct with min assist from PT. Performed with knee immobilizer in place  Stairs            Wheelchair Mobility    Modified Rankin (Stroke Patients Only)       Balance Overall balance assessment: Needs assistance Sitting-balance support: No upper extremity supported;Feet supported Sitting balance-[name]: Fair     Standing balance support: No upper extremity supported Standing balance-[name]: Fair                               Pertinent Vitals/Pain Pain Assessment: 0-10 Pain Score: 9  Pain Location: Rt knee Pain Descriptors / Indicators: Aching Pain Intervention(s): Limited activity within patient's tolerance;Monitored during session;Repositioned    Home Living Family/patient expects to be discharged to:: Skilled nursing facility Living Arrangements: Children Available Help at Discharge: Family;Available 24 hours/day (No family available until the following week) Type of Home: Apartment Home Access: Level entry     Home Layout: One level Home Equipment: Cane - single point      Prior Function Level of Independence: Independent with assistive device(s)         Comments: Used cane for ambulation     Hand Dominance   Dominant Hand: Right    Extremity/Trunk Assessment   Upper Extremity  Assessment: Defer to OT evaluation           Lower Extremity Assessment: RLE deficits/detail RLE Deficits / Details: Decreased strength and ROM as expected post op       Communication   Communication: No difficulties  Cognition  Arousal/Alertness: Awake/alert Behavior During Therapy: WFL for tasks assessed/performed Overall Cognitive Status: Within Functional Limits for tasks assessed                      General Comments      Exercises Total Joint Exercises Ankle Circles/Pumps: AROM;Both;10 reps;Supine Quad Sets: AROM;Right;5 reps;Supine      Assessment/Plan    PT Assessment Patient needs continued PT services  PT Diagnosis Difficulty walking;Abnormality of gait;Acute pain   PT Problem List Decreased strength;Decreased range of motion;Decreased activity tolerance;Decreased balance;Decreased mobility;Decreased knowledge of use of DME;Decreased knowledge of precautions;Pain  PT Treatment Interventions DME instruction;Gait training;Functional mobility training;Therapeutic activities;Therapeutic exercise;Balance training;Neuromuscular re-education;Patient/family education;Modalities   PT Goals (Current goals can be found in the Care Plan section) Acute Rehab PT Goals Patient Stated Goal: Get well PT Goal Formulation: With patient Time For Goal Achievement: 10/26/13 Potential to Achieve Goals: Good    Frequency 7X/week   Barriers to discharge Decreased caregiver support Daughter will not be at home for the next week to assist patient    Co-evaluation               End of Session Equipment Utilized During Treatment: Right knee immobilizer Activity Tolerance: Patient tolerated treatment well;Other (comment) (Requested end session early so RN could take her to bathroom) Patient left: in chair;with call bell/phone within reach;with family/visitor present;with nursing/sitter in room Nurse Communication: Mobility status;Other (comment) (Wants nursing staff to assist her to bathroom)         Time: 1430-1505 (5 minutes non-therapeutic while staff drew blood.) PT Time Calculation (min): 35 min   Charges:   PT Evaluation $Initial PT Evaluation Tier I: 1 Procedure PT  Treatments $Therapeutic Activity: 8-22 mins   PT G Codes:         Elayne Snare, [place]  Ellouise Newer 10/19/2013, 3:48 PM

## 2013-10-19 NOTE — Progress Notes (Signed)
Utilization review completed.  

## 2013-10-19 NOTE — Op Note (Addendum)
PREOP DIAGNOSIS: DJD RIGHT KNEE POSTOP DIAGNOSIS: same PROCEDURE: RIGHT TKR ANESTHESIA: General and block ATTENDING SURGEON: Nashali Ditmer G ASSISTANT: Loni Dolly PA  INDICATIONS FOR PROCEDURE: [name] is a 80 y.o. female who has struggled for a long time with pain due to degenerative arthritis of the right knee.  The patient has failed many conservative non-operative measures and at this point has pain which limits the ability to sleep and walk.  The patient is offered total knee replacement.  Informed operative consent was obtained after discussion of possible risks of anesthesia, infection, neurovascular injury, DVT, and death.  The importance of the post-operative rehabilitation protocol to optimize result was stressed extensively with the patient.  SUMMARY OF FINDINGS AND PROCEDURE:  [name] was taken to the operative suite where under the above anesthesia a right knee replacement was performed.  There were advanced degenerative changes and the bone quality was good.  We used the DePuy LCS system and placed size standard femur, 3 tibia, 35 mm all polyethylene patella, and a size 10 mm spacer.  Loni Dolly PA-C assisted throughout and was invaluable to the completion of the case in that he helped retract and maintain exposure while I placed components.  He also helped close thereby minimizing OR time.  The patient was admitted for appropriate post-op care to include perioperative antibiotics and mechanical and pharmacologic measures for DVT prophylaxis.  DESCRIPTION OF PROCEDURE:  [name] was taken to the operative suite where the above anesthesia was applied.  The patient was positioned supine and prepped and draped in normal sterile fashion.  An appropriate time out was performed.  After the administration of kefzol pre-op antibiotic the leg was elevated and exsanguinated and a tourniquet inflated. A standard longitudinal incision was made on the anterior knee.   Dissection was carried down to the extensor mechanism.  All appropriate anti-infective measures were used including the pre-operative antibiotic, betadine impregnated drape, and closed hooded exhaust systems for each member of the surgical team.  A medial parapatellar incision was made in the extensor mechanism and the knee cap flipped and the knee flexed.  Some residual meniscal tissues were removed along with any remaining ACL/PCL tissue.  A guide was placed on the tibia and a flat cut was made on it's superior surface.  An intramedullary guide was placed in the femur and was utilized to make anterior and posterior cuts creating an appropriate flexion gap.  A second intramedullary guide was placed in the femur to make a distal cut properly balancing the knee with an extension gap equal to the flexion gap.  The three bones sized to the above mentioned sizes and the appropriate guides were placed and utilized.  A trial reduction was done and the knee easily came to full extension and the patella tracked well on flexion.  The trial components were removed and all bones were cleaned with pulsatile lavage and then dried thoroughly.  Cement was mixed and was pressurized onto the bones followed by placement of the aforementioned components.  Excess cement was trimmed and pressure was held on the components until the cement had hardened.  The tourniquet was deflated and a small amount of bleeding was controlled with cautery and pressure.  The knee was irrigated thoroughly.  The extensor mechanism was re-approximated with V-loc suture in running fashion.  The knee was flexed and the repair was solid.  The subcutaneous tissues were re-approximated with #0 and #2-0 vicryl and the skin closed with a  subcuticular stitch and steristrips.  A sterile dressing was applied.  Intraoperative fluids, EBL, and tourniquet time can be obtained from anesthesia records.  DISPOSITION:  The patient was taken to recovery room in stable  condition and admitted for appropriate post-op care to include peri-operative antibiotic and DVT prophylaxis with mechanical and pharmacologic measures. She will resume her coumadin tonight and will bridge with Lovenox as well.  [name] G 10/19/2013, 9:04 AM

## 2013-10-19 NOTE — Anesthesia Preprocedure Evaluation (Addendum)
Anesthesia Evaluation  Patient identified by MRN, date of birth, ID band Patient awake    Reviewed: Allergy & Precautions, H&P , NPO status , Patient's Chart, lab work & pertinent test results  History of Anesthesia Complications Negative for: history of anesthetic complications  Airway Mallampati: II TM Distance: >3 FB Neck ROM: Full    Dental  (+) Upper Dentures, Dental Advisory Given, Partial Lower   Pulmonary neg pulmonary ROS,  breath sounds clear to auscultation        Cardiovascular hypertension, Pt. on medications + CAD and +CHF + dysrhythmias (coumadin) Atrial Fibrillation + pacemaker (for tachy-brady) Rhythm:Regular Rate:Normal  '14 ECHO: EF 55-60%, mild AI, trivial MR '05 cath: no sig coronary disease   Neuro/Psych  Headaches, Anxiety TIA   GI/Hepatic Neg liver ROS, GERD-  Controlled and Medicated,  Endo/Other  diabetes (diet controlled)Morbid obesity  Renal/GU negative Renal ROS     Musculoskeletal   Abdominal (+) + obese,   Peds  Hematology Coumadin: INR 1.22   Anesthesia Other Findings Top Dentures Removed  Reproductive/Obstetrics                      Anesthesia Physical Anesthesia Plan  ASA: III  Anesthesia Plan: General   Post-op Pain Management:    Induction: Intravenous  Airway Management Planned: Oral ETT  Additional Equipment:   Intra-op Plan:   Post-operative Plan: Extubation in OR  Informed Consent: I have reviewed the patients History and Physical, chart, labs and discussed the procedure including the risks, benefits and alternatives for the proposed anesthesia with the patient or authorized representative who has indicated his/her understanding and acceptance.   Dental advisory given  Plan Discussed with: Surgeon and CRNA  Anesthesia Plan Comments: (Plan routine monitors, GETA with femoral nerve block for post op analgesia Pt declines SAB)         Anesthesia Quick Evaluation

## 2013-10-19 NOTE — Anesthesia Procedure Notes (Addendum)
Procedure Name: Intubation Date/Time: 10/19/2013 7:44 AM Performed by: Storm Frisk E Pre-anesthesia Checklist: Patient identified, Timeout performed, Emergency Drugs available, Suction available and Patient being monitored Patient Re-evaluated:Patient Re-evaluated prior to inductionOxygen Delivery Method: Circle system utilized Preoxygenation: Pre-oxygenation with 100% oxygen Intubation Type: IV induction and Cricoid Pressure applied Ventilation: Mask ventilation without difficulty and Oral airway inserted - appropriate to patient size Laryngoscope Size: Mac and 3 Grade View: Grade I Tube type: Oral Tube size: 7.5 mm Number of attempts: 1 Airway Equipment and Method: Stylet and Oral airway Placement Confirmation: ETT inserted through vocal cords under direct vision,  breath sounds checked- equal and bilateral and positive ETCO2 Secured at: 21 cm Tube secured with: Tape Dental Injury: Teeth and Oropharynx as per pre-operative assessment    Anesthesia Regional Block:  Femoral nerve block  Pre-Anesthetic Checklist: ,, timeout performed, Correct Patient, Correct Site, Correct Laterality, Correct Procedure, Correct Position, site marked, Risks and benefits discussed,  Surgical consent,  Pre-op evaluation,  At surgeon's request and post-op pain management  Laterality: Right and Lower  Prep: chloraprep       Needles:  Injection technique: Single-shot  Needle Type: Echogenic Stimulator Needle     Needle Length: 4cm 4 cm Needle Gauge: 22 and 22 G    Additional Needles:  Procedures: nerve stimulator Femoral nerve block  Nerve Stimulator or Paresthesia:  Response: patella twitch, 0.4 mA, 0.1 ms,   Additional Responses:   Narrative:  Start time: 10/19/2013 7:27 AM End time: 10/19/2013 7:31 AM Injection made incrementally with aspirations every 5 mL.  Performed by: Personally  Anesthesiologist: Jenita Seashore, MD  Additional Notes: Pt identified in Holding room.  Monitors  applied. Working IV access confirmed. Sterile prep R groin.  #22ga PNS to patella twitch at 0.53mA threshold.  30cc 0.5% Bupivacaine with 1:200k epi injected incrementally after negative test dose.  Patient asymptomatic, VSS, no heme aspirated, tolerated well.  Jenita Seashore, MD

## 2013-10-19 NOTE — Anesthesia Postprocedure Evaluation (Signed)
  Anesthesia Post-op Note  Patient: [name]  Procedure(s) Performed: Procedure(s): TOTAL KNEE ARTHROPLASTY (Right)  Patient Location: PACU  Anesthesia Type:General  Level of Consciousness: awake, alert , oriented and patient cooperative  Airway and Oxygen Therapy: Patient Spontanous Breathing and Patient connected to nasal cannula oxygen  Post-op Pain: mild  Post-op Assessment: Post-op Vital signs reviewed, Patient's Cardiovascular Status Stable, Respiratory Function Stable, Patent Airway, No signs of Nausea or vomiting and Pain level controlled  Post-op Vital Signs: Reviewed and stable  Last Vitals:  Filed Vitals:   10/19/13 1200  BP: 159/74  Pulse: 78  Temp: 36.9 C  Resp: 28    Complications: No apparent anesthesia complications

## 2013-10-19 NOTE — Progress Notes (Signed)
Orthopedic Tech Progress Note Patient Details:  [name] 1932/06/02 [id number] CPM applied to RLE with appropriate settings. OHF applied to bed.  CPM Right Knee CPM Right Knee: On Right Knee Flexion (Degrees): 90 Right Knee Extension (Degrees): 0   Asia R Thompson 10/19/2013, 1:01 PM

## 2013-10-19 NOTE — Interval H&P Note (Signed)
History and Physical Interval Note:  10/19/2013 7:21 AM  [name]  has presented today for surgery, with the diagnosis of RIGHT KNEE DEGENERATIVE JOINT DISEASE  The various methods of treatment have been discussed with the patient and family. After consideration of risks, benefits and other options for treatment, the patient has consented to  Procedure(s): TOTAL KNEE ARTHROPLASTY (Right) as a surgical intervention .  The patient's history has been reviewed, patient examined, no change in status, stable for surgery.  I have reviewed the patient's chart and labs.  Questions were answered to the patient's satisfaction.     Stephinie Battisti G

## 2013-10-19 NOTE — Discharge Instructions (Signed)

## 2013-10-19 NOTE — Transfer of Care (Signed)
Immediate Anesthesia Transfer of Care Note  Patient: [name]  Procedure(s) Performed: Procedure(s): TOTAL KNEE ARTHROPLASTY (Right)  Patient Location: PACU  Anesthesia Type:General  Level of Consciousness: lethargic and responds to stimulation  Airway & Oxygen Therapy: Patient Spontanous Breathing and Patient connected to nasal cannula oxygen  Post-op Assessment: Report given to PACU RN and Post -op Vital signs reviewed and stable  Post vital signs: Reviewed and stable  Complications: No apparent anesthesia complications

## 2013-10-20 ENCOUNTER — Encounter (HOSPITAL_COMMUNITY): Payer: Self-pay | Admitting: Orthopaedic Surgery

## 2013-10-20 DIAGNOSIS — M1710 Unilateral primary osteoarthritis, unspecified knee: Secondary | ICD-10-CM | POA: Diagnosis not present

## 2013-10-20 LAB — CBC
HCT: 32.4 % — ABNORMAL LOW (ref 36.0–46.0)
HEMOGLOBIN: 10.6 g/dL — AB (ref 12.0–15.0)
MCH: 27.1 pg (ref 26.0–34.0)
MCHC: 32.7 g/dL (ref 30.0–36.0)
MCV: 82.9 fL (ref 78.0–100.0)
Platelets: 208 10*3/uL (ref 150–400)
RBC: 3.91 MIL/uL (ref 3.87–5.11)
RDW: 14.3 % (ref 11.5–15.5)
WBC: 14.2 10*3/uL — ABNORMAL HIGH (ref 4.0–10.5)

## 2013-10-20 LAB — BASIC METABOLIC PANEL
Anion gap: 11 (ref 5–15)
BUN: 11 mg/dL (ref 6–23)
CHLORIDE: 100 meq/L (ref 96–112)
CO2: 27 mEq/L (ref 19–32)
CREATININE: 0.93 mg/dL (ref 0.50–1.10)
Calcium: 8.6 mg/dL (ref 8.4–10.5)
GFR calc non Af Amer: 57 mL/min — ABNORMAL LOW (ref >90)
GFR, EST AFRICAN AMERICAN: 66 mL/min — AB (ref >90)
GLUCOSE: 122 mg/dL — AB (ref 70–99)
Potassium: 4.1 mEq/L (ref 3.7–5.3)
Sodium: 138 mEq/L (ref 137–147)

## 2013-10-20 LAB — GLUCOSE, CAPILLARY
GLUCOSE-CAPILLARY: 112 mg/dL — AB (ref 70–99)
GLUCOSE-CAPILLARY: 145 mg/dL — AB (ref 70–99)
Glucose-Capillary: 117 mg/dL — ABNORMAL HIGH (ref 70–99)
Glucose-Capillary: 145 mg/dL — ABNORMAL HIGH (ref 70–99)

## 2013-10-20 LAB — PROTIME-INR
INR: 1.27 (ref 0.00–1.49)
Prothrombin Time: 15.9 seconds — ABNORMAL HIGH (ref 11.6–15.2)

## 2013-10-20 MED ORDER — WARFARIN SODIUM 7.5 MG PO TABS
7.5000 mg | ORAL_TABLET | Freq: Once | ORAL | Status: AC
  Administered 2013-10-20: 7.5 mg via ORAL
  Filled 2013-10-20: qty 1

## 2013-10-20 NOTE — Progress Notes (Signed)
ANTICOAGULATION CONSULT NOTE - Follow Up Consult  Pharmacy Consult for coumadin Indication: VTE prophylaxis and hx of Afib   Allergies  Allergen Reactions  . Lasix [Furosemide] Other (See Comments)    "Too strong" - tolerates Bumex  . Levsin [Hyoscyamine Sulfate]   . Procardia [Nifedipine] Other (See Comments)    GI uspet  . Prednisone Palpitations and Other (See Comments)    Tachycardia, fluttering    Patient Measurements: Weight: 200 lb (90.719 kg)   Vital Signs: Temp: 98.7 F (37.1 C) (09/23 0509) BP: 121/58 mmHg (09/23 0509) Pulse Rate: 72 (09/23 0509)  Labs:  Recent Labs  10/19/13 0544 10/19/13 1300 10/20/13 0454  HGB  --  12.9 10.6*  HCT  --  39.6 32.4*  PLT  --  232 208  APTT 33  --   --   LABPROT 15.4*  --  15.9*  INR 1.22  --  1.27  CREATININE  --  0.82 0.93    The CrCl is unknown because both a height and weight (above a minimum accepted value) are required for this calculation.  Assessment: Patient is a 80 y.o F s/p right TKA and coumadin PTA for afib.  INR is 1.27 with coumadin resumed last night.  No bleeding documented.  Goal of Therapy:  INR 2-3    Plan:  1) coumadin 7.5mg  PO x1 today  Micah Galeno P 10/20/2013,10:18 AM

## 2013-10-20 NOTE — Progress Notes (Signed)
Chaplain responded to a consult for prayer. Pt requests prayer around a "closer walk with God." Chaplain will follow as needed.   10/20/13 1700  Clinical Encounter Type  Visited With Patient and family together  Visit Type Initial;Spiritual support  Referral From Nurse  Spiritual Encounters  Spiritual Needs Emotional;Prayer  Stress Factors  Patient Stress Factors None identified  Rolly Salter, Chaplain 5:08 PM 10/20/2013

## 2013-10-20 NOTE — Clinical Social Work Note (Signed)
CSW met with pt regarding SNF/STR recommendation.  Pt is agreeable.  Full assessment to follow.  Pt is a part of the bundle program and chooses [place], [place].  CSW to facilitate dc.  Nonnie Done, [place] [phone]  Psychiatric & Orthopedics (5N 1-16) Clinical Social Worker

## 2013-10-20 NOTE — Progress Notes (Signed)
OT Cancellation Note  Patient Details Name: [name] MRN: [id number] DOB: 1932-12-04   Cancelled Treatment:    Reason Eval/Treat Not Completed: Other (comment) Pt is Medicare and current D/C plan is SNF. No apparent immediate acute care OT needs, therefore will defer OT to SNF. If OT eval is needed please call Acute Rehab Dept. at [phone] or text page OT at [phone].    [place] Her[place] M  Cyndie Chime, OTR/L Occupational Therapist [phone] 10/20/2013, 8:19 AM

## 2013-10-20 NOTE — Progress Notes (Signed)
Physical Therapy Treatment Patient Details Name: [name] MRN: [id number] DOB: [birth year] Today's Date: 10/20/2013    History of Present Illness 80 y.o. female s/p right total knee arthroplasty. Hx of a-fib with pacemaker placement, chronic diastolic heart failure, and stroke.    PT Comments    Patient progressing towards physical therapy goals, ambulating with min assist while using a rolling walker up to 75 feet this AM. Tolerating therapeutic exercises well with ROM 5-90 degrees right knee flexion. Patient will continue to benefit from skilled physical therapy services to further improve independence with functional mobility.   Follow Up Recommendations  SNF;Supervision for mobility/OOB     Equipment Recommendations  Rolling walker with 5" wheels;3in1 (PT)    Recommendations for Other Services       Precautions / Restrictions Precautions Precautions: Knee Precaution Booklet Issued: Yes (comment) Precaution Comments: Reviewed knee precautions Required Braces or Orthoses: Knee Immobilizer - Right Restrictions Weight Bearing Restrictions: Yes RLE Weight Bearing: Weight bearing as tolerated    Mobility  Bed Mobility Overal bed mobility: Needs Assistance Bed Mobility: Supine to Sit     Supine to sit: HOB elevated;Supervision     General bed mobility comments: Supervision for safety. VC for technique. No physical assist needed  Transfers Overall transfer level: Needs assistance Equipment used: Rolling walker (2 wheeled) Transfers: Sit to/from Stand Sit to Stand: Min guard         General transfer comment: Min guard for safety. VC for hand placement. Required 2 attempts again today but was able to perform from lowest bed setting.  Ambulation/Gait Ambulation/Gait assistance: Min assist Ambulation Distance (Feet): 75 Feet Assistive device: Rolling walker (2 wheeled) Gait Pattern/deviations: Step-to pattern;Step-through pattern;Decreased step length -  left;Decreased stance time - right;Antalgic   Gait velocity interpretation: Below normal speed for age/gender General Gait Details: Instructions for step-through gait pattern. Several pregait exercises performed ie. marches, weight shift, standing knee extension, in order to accept weight through RLE. Intermittent VC for forward gaze and right knee extension in stance phase for quad activation. Mild knee instability noted in Rt knee on occasiona however, able to self correct any buckling with use of UEs. Min assist for walker control initially   Stairs            Wheelchair Mobility    Modified Rankin (Stroke Patients Only)       Balance                                    Cognition Arousal/Alertness: Awake/alert Behavior During Therapy: WFL for tasks assessed/performed Overall Cognitive Status: Within Functional Limits for tasks assessed                      Exercises Total Joint Exercises Ankle Circles/Pumps: AROM;Both;10 reps;Supine Quad Sets: AROM;Right;Supine;10 reps Heel Slides: AAROM;Right;10 reps;Supine Hip ABduction/ADduction: AAROM;Right;10 reps;Supine Straight Leg Raises: Strengthening;Right;10 reps;Supine Knee Flexion: AAROM;Right;10 reps;Seated Goniometric ROM: 5-90 degrees right knee flexion in sitting    General Comments General comments (skin integrity, edema, etc.): Bilateral LE edema - pt reports this is baseline for her      Pertinent Vitals/Pain Pain Assessment: 0-10 Pain Score:  ("doing okay today" no value given) Pain Location: Rt knee Pain Intervention(s): Monitored during session;Repositioned    Home Living                      Prior Function  PT Goals (current goals can now be found in the care plan section) Acute Rehab PT Goals PT Goal Formulation: With patient Time For Goal Achievement: 10/26/13 Potential to Achieve Goals: Good Progress towards PT goals: Progressing toward goals     Frequency  7X/week    PT Plan Current plan remains appropriate    Co-evaluation             End of Session   Activity Tolerance: Patient tolerated treatment well Patient left: in chair;with call bell/phone within reach     Time: 8937-3428 PT Time Calculation (min): 30 min  Charges:  $Gait Training: 8-22 mins $Therapeutic Exercise: 8-22 mins                    G Codes:      [employer], [place]  Ellouise Newer 10/20/2013, 9:33 AM

## 2013-10-20 NOTE — Progress Notes (Signed)
Subjective: 1 Day Post-Op Procedure(s) (LRB): TOTAL KNEE ARTHROPLASTY (Right)  Activity level:  wbat Diet tolerance:  ok Voiding:  ok Patient reports pain as mild and moderate.    Objective: Vital signs in last 24 hours: Temp:  [97.8 F (36.6 C)-98.7 F (37.1 C)] 98.7 F (37.1 C) (09/23 0509) Pulse Rate:  [48-100] 72 (09/23 0509) Resp:  [9-28] 18 (09/23 0509) BP: (121-182)/(58-101) 121/58 mmHg (09/23 0509) SpO2:  [84 %-99 %] 96 % (09/23 0509)  Labs:  Recent Labs  10/19/13 1300 10/20/13 0454  HGB 12.9 10.6*    Recent Labs  10/19/13 1300 10/20/13 0454  WBC 12.3* 14.2*  RBC 4.63 3.91  HCT 39.6 32.4*  PLT 232 208    Recent Labs  10/19/13 1300 10/20/13 0454  NA  --  138  K  --  4.1  CL  --  100  CO2  --  27  BUN  --  11  CREATININE 0.82 0.93  GLUCOSE  --  122*  CALCIUM  --  8.6    Recent Labs  10/19/13 0544 10/20/13 0454  INR 1.22 1.27    Physical Exam:  Neurologically intact ABD soft Neurovascular intact Sensation intact distally Intact pulses distally Dorsiflexion/Plantar flexion intact Incision: scant drainage No cellulitis present Compartment soft  Assessment/Plan:  1 Day Post-Op Procedure(s) (LRB): TOTAL KNEE ARTHROPLASTY (Right) Advance diet Up with therapy D/C IV fluids Plan for discharge tomorrow Discharge to SNF if doing well and cleared by PT. Dressing changed to Aquacel. Continue on coumadin for DVT prevention and A-fib. Follow up in office 2 weeks post op.    Oswaldo Cueto, Larwance Sachs 10/20/2013, 7:56 AM

## 2013-10-21 DIAGNOSIS — S8990XA Unspecified injury of unspecified lower leg, initial encounter: Secondary | ICD-10-CM | POA: Diagnosis not present

## 2013-10-21 DIAGNOSIS — I2510 Atherosclerotic heart disease of native coronary artery without angina pectoris: Secondary | ICD-10-CM | POA: Diagnosis not present

## 2013-10-21 DIAGNOSIS — F411 Generalized anxiety disorder: Secondary | ICD-10-CM | POA: Diagnosis not present

## 2013-10-21 DIAGNOSIS — I5032 Chronic diastolic (congestive) heart failure: Secondary | ICD-10-CM | POA: Diagnosis not present

## 2013-10-21 DIAGNOSIS — H409 Unspecified glaucoma: Secondary | ICD-10-CM | POA: Diagnosis not present

## 2013-10-21 DIAGNOSIS — R278 Other lack of coordination: Secondary | ICD-10-CM | POA: Diagnosis not present

## 2013-10-21 DIAGNOSIS — F419 Anxiety disorder, unspecified: Secondary | ICD-10-CM | POA: Diagnosis not present

## 2013-10-21 DIAGNOSIS — I10 Essential (primary) hypertension: Secondary | ICD-10-CM | POA: Diagnosis not present

## 2013-10-21 DIAGNOSIS — R279 Unspecified lack of coordination: Secondary | ICD-10-CM | POA: Diagnosis not present

## 2013-10-21 DIAGNOSIS — R2689 Other abnormalities of gait and mobility: Secondary | ICD-10-CM | POA: Diagnosis not present

## 2013-10-21 DIAGNOSIS — K5900 Constipation, unspecified: Secondary | ICD-10-CM | POA: Diagnosis not present

## 2013-10-21 DIAGNOSIS — Z471 Aftercare following joint replacement surgery: Secondary | ICD-10-CM | POA: Diagnosis not present

## 2013-10-21 DIAGNOSIS — R269 Unspecified abnormalities of gait and mobility: Secondary | ICD-10-CM | POA: Diagnosis not present

## 2013-10-21 DIAGNOSIS — M25569 Pain in unspecified knee: Secondary | ICD-10-CM | POA: Diagnosis not present

## 2013-10-21 DIAGNOSIS — Z96659 Presence of unspecified artificial knee joint: Secondary | ICD-10-CM | POA: Diagnosis not present

## 2013-10-21 DIAGNOSIS — M6281 Muscle weakness (generalized): Secondary | ICD-10-CM | POA: Diagnosis not present

## 2013-10-21 DIAGNOSIS — M1710 Unilateral primary osteoarthritis, unspecified knee: Secondary | ICD-10-CM | POA: Diagnosis not present

## 2013-10-21 DIAGNOSIS — D62 Acute posthemorrhagic anemia: Secondary | ICD-10-CM | POA: Diagnosis not present

## 2013-10-21 DIAGNOSIS — I4891 Unspecified atrial fibrillation: Secondary | ICD-10-CM | POA: Diagnosis not present

## 2013-10-21 LAB — CBC
HCT: 33.1 % — ABNORMAL LOW (ref 36.0–46.0)
Hemoglobin: 10.6 g/dL — ABNORMAL LOW (ref 12.0–15.0)
MCH: 26.8 pg (ref 26.0–34.0)
MCHC: 32 g/dL (ref 30.0–36.0)
MCV: 83.6 fL (ref 78.0–100.0)
PLATELETS: 203 10*3/uL (ref 150–400)
RBC: 3.96 MIL/uL (ref 3.87–5.11)
RDW: 14.4 % (ref 11.5–15.5)
WBC: 10.5 10*3/uL (ref 4.0–10.5)

## 2013-10-21 LAB — PROTIME-INR
INR: 1.52 — ABNORMAL HIGH (ref 0.00–1.49)
PROTHROMBIN TIME: 18.3 s — AB (ref 11.6–15.2)

## 2013-10-21 LAB — GLUCOSE, CAPILLARY
GLUCOSE-CAPILLARY: 100 mg/dL — AB (ref 70–99)
GLUCOSE-CAPILLARY: 109 mg/dL — AB (ref 70–99)

## 2013-10-21 MED ORDER — METHOCARBAMOL 500 MG PO TABS: 500.0000 mg | ORAL_TABLET | Freq: Four times a day (QID) | ORAL | Status: DC | PRN

## 2013-10-21 MED ORDER — HYDROCODONE-ACETAMINOPHEN 5-325 MG PO TABS: 1.0000 | ORAL_TABLET | ORAL | Status: DC | PRN

## 2013-10-21 MED ORDER — WARFARIN SODIUM 5 MG PO TABS: 5.0000 mg | ORAL_TABLET | Freq: Every day | ORAL | Status: DC

## 2013-10-21 MED ORDER — WARFARIN SODIUM 7.5 MG PO TABS
7.5000 mg | ORAL_TABLET | Freq: Once | ORAL | Status: DC
Start: 2013-10-21 — End: 2013-10-21
  Filled 2013-10-21: qty 1

## 2013-10-21 MED ORDER — ENOXAPARIN SODIUM 30 MG/0.3ML ~~LOC~~ SOLN: 30.0000 mg | Freq: Two times a day (BID) | SUBCUTANEOUS | Status: DC

## 2013-10-21 NOTE — Care Management Note (Signed)
CARE MANAGEMENT NOTE 10/21/2013  Patient:  ZIAIRE, BIESER   Account Number:  [id number]  Date Initiated:  10/21/2013  Documentation initiated by:  Ricki Miller  Subjective/Objective Assessment:   80 yr old female admitted with DJD of right knee, s/p right total knee arthroplasty.     Action/Plan:   Patient is for shortterm rehab, social worker is aware. patient will go to [facility]   Anticipated DC Date:  10/21/2013   Anticipated DC Plan:  [place]  CM consult      PAC Choice  NA   Choice offered to / List presented to:  NA   DME arranged  NA        HH arranged  NA      Status of service:  Completed, signed off Medicare Important Message given?  NA - LOS <3 / Initial given by admissions (If response is "NO", the following Medicare IM given date fields will be blank) Date Medicare IM given:   Medicare IM given by:   Date Additional Medicare IM given:   Additional Medicare IM given by:    Discharge Disposition:  [place]  Per UR Regulation:  Reviewed for med. necessity/level of care/duration of stay

## 2013-10-21 NOTE — Progress Notes (Signed)
[name] to be D/C'd Skilled nursing facility per MD order. Discussed with the patient and all questions fully answered.    Medication List    STOP taking these medications       celecoxib 200 MG capsule  Commonly known as:  CELEBREX      TAKE these medications       acetaminophen 500 MG tablet  Commonly known as:  TYLENOL  Take 1,000 mg by mouth every 6 (six) hours as needed for pain.     amLODipine 5 MG tablet  Commonly known as:  NORVASC  Take 5 mg by mouth daily.     b complex vitamins tablet  Take 1 tablet by mouth daily.     beta carotene 25000 UNIT capsule  Take 25,000 Units by mouth 3 (three) times a week.     bimatoprost 0.01 % Soln  Commonly known as:  LUMIGAN  Place 1 drop into both eyes at bedtime.     bumetanide 0.5 MG tablet  Commonly known as:  BUMEX  Take 1 tablet (0.5 mg total) by mouth daily.     calcium carbonate 600 MG Tabs tablet  Commonly known as:  OS-CAL  Take 600 mg by mouth daily.     diazepam 5 MG tablet  Commonly known as:  VALIUM  Take 5 mg by mouth daily as needed for anxiety.     Elderberry 575 MG/5ML Syrp  Take 575 mg by mouth daily as needed (cold symptoms).     enoxaparin 30 MG/0.3ML injection  Commonly known as:  LOVENOX  Inject 0.3 mLs (30 mg total) into the skin every 12 (twelve) hours.     HYDROcodone-acetaminophen 5-325 MG per tablet  Commonly known as:  NORCO/VICODIN  Take 1-2 tablets by mouth every 4 (four) hours as needed (breakthrough pain).     lidocaine 5 %  Commonly known as:  LIDODERM  Place 1 patch onto the skin daily.     methocarbamol 500 MG tablet  Commonly known as:  ROBAXIN  Take 1 tablet (500 mg total) by mouth every 6 (six) hours as needed for muscle spasms.     multivitamin with minerals Tabs tablet  Take 1 tablet by mouth daily.     nystatin 100000 UNIT/ML suspension  Commonly known as:  MYCOSTATIN  Take 500,000 Units by mouth daily as needed (for mouth irritation).     OVER THE  COUNTER MEDICATION  Apply 1 application topically 2 (two) times daily. "Formula 3", OTC medication for toenail fungus.     potassium chloride SA 20 MEQ tablet  Commonly known as:  K-DUR,KLOR-CON  Take 1 tablet (20 mEq total) by mouth daily.     REFRESH DRY EYE THERAPY OP  Apply 1 drop to eye daily as needed (dry eyes).     senna 8.6 MG tablet  Commonly known as:  SENOKOT  Take 2 tablets by mouth at bedtime.     valsartan 320 MG tablet  Commonly known as:  DIOVAN  Take 1 tablet (320 mg total) by mouth daily.     vitamin C 500 MG tablet  Commonly known as:  ASCORBIC ACID  Take 500 mg by mouth daily.     warfarin 5 MG tablet  Commonly known as:  COUMADIN  Take 1-1.5 tablets (5-7.5 mg total) by mouth daily. 5 mg daily except 7.5mg  on MWF        VVS, Skin clean, dry and intact without evidence of skin break down, no  evidence of skin tears noted.  IV catheter discontinued intact. Site without signs and symptoms of complications. Dressing and pressure applied.  An After Visit Summary was printed and given to the patient.  Patient escorted via stretcher and D/C to SNF via ambulance  [place] Numbers  10/21/2013 4:33 PM

## 2013-10-21 NOTE — Progress Notes (Signed)
ANTICOAGULATION CONSULT NOTE - Follow Up Consult  Pharmacy Consult for coumadin Indication: VTE prophylaxis and hx of Afib   Allergies  Allergen Reactions  . Lasix [Furosemide] Other (See Comments)    "Too strong" - tolerates Bumex  . Levsin [Hyoscyamine Sulfate]   . Procardia [Nifedipine] Other (See Comments)    GI uspet  . Prednisone Palpitations and Other (See Comments)    Tachycardia, fluttering    Patient Measurements: Weight: 200 lb (90.719 kg)   Vital Signs: Temp: 98.3 F (36.8 C) (09/24 0638) Temp src: Oral (09/24 0638) BP: 128/60 mmHg (09/24 0638) Pulse Rate: 70 (09/24 0638)  Labs:  Recent Labs  10/19/13 0544  10/19/13 1300 10/20/13 0454 10/21/13 0650  HGB  --   < > 12.9 10.6* 10.6*  HCT  --   --  39.6 32.4* 33.1*  PLT  --   --  232 208 203  APTT 33  --   --   --   --   LABPROT 15.4*  --   --  15.9* 18.3*  INR 1.22  --   --  1.27 1.52*  CREATININE  --   --  0.82 0.93  --   < > = values in this interval not displayed.  The CrCl is unknown because both a height and weight (above a minimum accepted value) are required for this calculation.   Assessment: Patient is a 80 y.o F on coumadin for hx afib and VTE px s/p right TKA.  INR is subtherapeutic but is trending up.  No bleeding documented.  Goal of Therapy:  INR 2-3    Plan:  1) coumadin 7.5mg  PO x1 this evening if not discharge today 2) d/c to SNF on 9/24   Nolan Lasser P 10/21/2013,1:12 PM

## 2013-10-21 NOTE — Discharge Summary (Signed)
Patient ID: [name] MRN: [id number] DOB/AGE: [birth year] 80 y.o.  Admit date: 10/19/2013 Discharge date: 10/21/2013  Admission Diagnoses:  Principal Problem:   Right knee DJD Active Problems:   Morbid obesity   Discharge Diagnoses:  Same  Past Medical History  Diagnosis Date  . Depression   . Coronary atherosclerosis of native coronary artery   . Venous insufficiency   . Tachy-brady syndrome     With permanent pacemaker 04/03/99  . PAF (paroxysmal atrial fibrillation)     takes Coumadin daily  . Lichen planus   . Hypertension     takes Amlodipine and Diovan daily  . Chronic diastolic CHF (congestive heart failure)     takes Bumex daily  . Anxiety     takes Valium daily as needed  . Muscle spasm     takes Celebrex daily and Robaxin daily as needed  . Heart murmur   . TIA (transient ischemic attack)   . Pacemaker   . Pneumonia     as a child  . Dizziness     occasionally  . Joint pain   . Joint swelling   . Chronic back pain     stenosis  . History of gout     doesn't take any meds  . History of gastric ulcer   . History of colon polyps   . Diverticulosis   . Glaucoma     uses eye drops daily  . Chronic bronchitis     "get it practically q yr"   . Tuberculosis     during childhood unsure of time  . Type II diabetes mellitus     but doesn't take any meds (10/19/2013)  . Stroke 1990's    "found it in ~ 1997-1998; never even knew I'd had one"  . Osteoarthritis   . Arthritis     "joints"  . GERD (gastroesophageal reflux disease)     Surgeries: Procedure(s): TOTAL KNEE ARTHROPLASTY on 10/19/2013   Consultants:    Discharged Condition: Improved  Hospital Course: [name] is an 80 y.o. female who was admitted 10/19/2013 for operative treatment ofRight knee DJD. Patient has severe unremitting pain that affects sleep, daily activities, and work/hobbies. After pre-op clearance the patient was taken to the operating room on 10/19/2013 and  underwent  Procedure(s): TOTAL KNEE ARTHROPLASTY.    Patient was given perioperative antibiotics: Anti-infectives   Start     Dose/Rate Route Frequency Ordered Stop   10/19/13 1400  ceFAZolin (ANCEF) IVPB 2 g/50 mL premix     2 g 100 mL/hr over 30 Minutes Intravenous Every 6 hours 10/19/13 1238 10/19/13 2127   10/19/13 0600  ceFAZolin (ANCEF) IVPB 2 g/50 mL premix     2 g 100 mL/hr over 30 Minutes Intravenous On call to O.R. 10/18/13 1437 10/19/13 0738       Patient was given sequential compression devices, early ambulation, and chemoprophylaxis to prevent DVT.  Patient benefited maximally from hospital stay and there were no complications.    Recent vital signs: Patient Vitals for the past 24 hrs:  BP Temp Temp src Pulse Resp SpO2  10/21/13 0638 128/60 mmHg 98.3 F (36.8 C) Oral 70 16 95 %  10/21/13 0400 - - - - 17 -  10/21/13 0000 - - - - 18 -  10/20/13 2000 - - - - 17 -  10/20/13 1941 139/62 mmHg 98 F (36.7 C) - 74 16 97 %     Recent laboratory studies:  Recent Labs  10/19/13 1300 10/20/13 0454 10/21/13 0650  WBC 12.3* 14.2* 10.5  HGB 12.9 10.6* 10.6*  HCT 39.6 32.4* 33.1*  PLT 232 208 203  NA  --  138  --   K  --  4.1  --   CL  --  100  --   CO2  --  27  --   BUN  --  11  --   CREATININE 0.82 0.93  --   GLUCOSE  --  122*  --   INR  --  1.27 1.52*  CALCIUM  --  8.6  --      Discharge Medications:     Medication List    STOP taking these medications       celecoxib 200 MG capsule  Commonly known as:  CELEBREX      TAKE these medications       acetaminophen 500 MG tablet  Commonly known as:  TYLENOL  Take 1,000 mg by mouth every 6 (six) hours as needed for pain.     amLODipine 5 MG tablet  Commonly known as:  NORVASC  Take 5 mg by mouth daily.     b complex vitamins tablet  Take 1 tablet by mouth daily.     beta carotene 25000 UNIT capsule  Take 25,000 Units by mouth 3 (three) times a week.     bimatoprost 0.01 % Soln  Commonly known as:   LUMIGAN  Place 1 drop into both eyes at bedtime.     bumetanide 0.5 MG tablet  Commonly known as:  BUMEX  Take 1 tablet (0.5 mg total) by mouth daily.     calcium carbonate 600 MG Tabs tablet  Commonly known as:  OS-CAL  Take 600 mg by mouth daily.     diazepam 5 MG tablet  Commonly known as:  VALIUM  Take 5 mg by mouth daily as needed for anxiety.     Elderberry 575 MG/5ML Syrp  Take 575 mg by mouth daily as needed (cold symptoms).     enoxaparin 30 MG/0.3ML injection  Commonly known as:  LOVENOX  Inject 0.3 mLs (30 mg total) into the skin every 12 (twelve) hours.     HYDROcodone-acetaminophen 5-325 MG per tablet  Commonly known as:  NORCO/VICODIN  Take 1-2 tablets by mouth every 4 (four) hours as needed (breakthrough pain).     lidocaine 5 %  Commonly known as:  LIDODERM  Place 1 patch onto the skin daily.     methocarbamol 500 MG tablet  Commonly known as:  ROBAXIN  Take 1 tablet (500 mg total) by mouth every 6 (six) hours as needed for muscle spasms.     multivitamin with minerals Tabs tablet  Take 1 tablet by mouth daily.     nystatin 100000 UNIT/ML suspension  Commonly known as:  MYCOSTATIN  Take 500,000 Units by mouth daily as needed (for mouth irritation).     OVER THE COUNTER MEDICATION  Apply 1 application topically 2 (two) times daily. "Formula 3", OTC medication for toenail fungus.     potassium chloride SA 20 MEQ tablet  Commonly known as:  K-DUR,KLOR-CON  Take 1 tablet (20 mEq total) by mouth daily.     REFRESH DRY EYE THERAPY OP  Apply 1 drop to eye daily as needed (dry eyes).     senna 8.6 MG tablet  Commonly known as:  SENOKOT  Take 2 tablets by mouth at bedtime.     valsartan 320 MG tablet  Commonly known as:  DIOVAN  Take 1 tablet (320 mg total) by mouth daily.     vitamin C 500 MG tablet  Commonly known as:  ASCORBIC ACID  Take 500 mg by mouth daily.     warfarin 5 MG tablet  Commonly known as:  COUMADIN  Take 1-1.5 tablets (5-7.5  mg total) by mouth daily. 5 mg daily except 7.5mg  on MWF        Diagnostic Studies: Dg Chest 2 View  10/12/2013   CLINICAL DATA:  80 year old female with previous history of tuberculosis. History of coronary artery disease hypertension, heart murmur, diabetes. Currently presenting preoperative for total knee arthroplasty.  EXAM: CHEST  2 VIEW  COMPARISON:  Chest x-ray 05/12/2012, CT 10/09/2010  FINDINGS: Cardiac size unchanged from comparison. Tortuosity of descending thoracic aorta again noted. Atherosclerotic calcifications of the aortic arch.  Re- demonstration of cardiac pacing device on left chest wall with 2 leads in place, terminating in the region the right atrium and overlying the left ventricle.  Interstitial opacities persist without evidence of confluent airspace disease. Mild flattening of the hemidiaphragms. No pneumothorax or pleural effusion.  No acute bony abnormality identified. Changes of previous spine surgery.  IMPRESSION: No radiographic evidence of acute cardiopulmonary disease, with background of chronic changes.  Atherosclerosis. Evidence of chronic hypertension with tortuous thoracic aorta.  Unchanged position of left chest wall cardiac pacing device with 2 leads in place.  Signed,  Dulcy Fanny. Earleen Newport, DO  [facility]  [facility]   Electronically Signed   By: Corrie Mckusick O.D.   On: 10/12/2013 12:02    Disposition: 01-Home or Self Care      Discharge Instructions   Call MD / Call 911    Complete by:  As directed   If you experience chest pain or shortness of breath, CALL 911 and be transported to the hospital emergency room.  If you develope a fever above 101 F, pus (white drainage) or increased drainage or redness at the wound, or calf pain, call your surgeon's office.     Constipation Prevention    Complete by:  As directed   Drink plenty of fluids.  Prune juice may be helpful.  You may use a stool softener, such as Colace  (over the counter) 100 mg twice a day.  Use MiraLax (over the counter) for constipation as needed.     Diet - low sodium heart healthy    Complete by:  As directed      Increase activity slowly as tolerated    Complete by:  As directed            Follow-up Information   Follow up with Hessie Dibble, MD. Call in 2 weeks.   Specialty:  Orthopedic Surgery   Contact information:   [place] [address] [phone]        Signed: Rich Fuchs 10/21/2013, 1:36 PM

## 2013-10-21 NOTE — Progress Notes (Signed)
Report called to [facility].

## 2013-10-21 NOTE — Progress Notes (Signed)
Physical Therapy Treatment Patient Details Name: [name] MRN: [id number] DOB: 1932-03-01 Today's Date: 10/21/2013    History of Present Illness 80 y.o. female s/p right total knee arthroplasty. Hx of a-fib with pacemaker placement, chronic diastolic heart failure, and stroke.    PT Comments    Progressing towards physical therapy goals. Ambulatory distance limited by increase in pain this afternoon. Focused on gait training. Patient will continue to benefit from skilled physical therapy services to further improve independence with functional mobility.   Follow Up Recommendations  SNF;Supervision for mobility/OOB     Equipment Recommendations  Rolling walker with 5" wheels;3in1 (PT)    Recommendations for Other Services       Precautions / Restrictions Precautions Precautions: Knee Precaution Comments: Reviewed knee precautions Required Braces or Orthoses: Knee Immobilizer - Right Restrictions Weight Bearing Restrictions: Yes RLE Weight Bearing: Weight bearing as tolerated    Mobility  Bed Mobility Overal bed mobility: Modified Independent             General bed mobility comments: Requires extra time no physical assist  Transfers Overall transfer level: Needs assistance Equipment used: Rolling walker (2 wheeled) Transfers: Sit to/from Stand Sit to Stand: Min guard         General transfer comment: Min guard for safety from lowest bed setting. VC for hand placement.  Ambulation/Gait Ambulation/Gait assistance: Min guard Ambulation Distance (Feet): 75 Feet Assistive device: Rolling walker (2 wheeled) Gait Pattern/deviations: Step-to pattern;Step-through pattern;Decreased step length - left;Decreased stance time - right;Antalgic   Gait velocity interpretation: Below normal speed for age/gender General Gait Details: Focused on step-through gait pattern. More difficulty this afternoon due to increased pain. No evidence of knee buckling without knee  immobilizer. VC for right knee extension in stance phase for quad activation.   Stairs            Wheelchair Mobility    Modified Rankin (Stroke Patients Only)       Balance                                    Cognition Arousal/Alertness: Awake/alert Behavior During Therapy: WFL for tasks assessed/performed Overall Cognitive Status: Within Functional Limits for tasks assessed                      Exercises Total Joint Exercises Ankle Circles/Pumps: AROM;Both;10 reps;Seated Quad Sets: AROM;Right;10 reps;Seated    General Comments        Pertinent Vitals/Pain Pain Assessment: 0-10 Pain Score: 10-Worst pain ever Pain Location: Rt knee Pain Intervention(s): Limited activity within patient's tolerance;Monitored during session;Repositioned    Home Living                      Prior Function            PT Goals (current goals can now be found in the care plan section) Acute Rehab PT Goals PT Goal Formulation: With patient Time For Goal Achievement: 10/26/13 Potential to Achieve Goals: Good Progress towards PT goals: Progressing toward goals    Frequency  7X/week    PT Plan Current plan remains appropriate    Co-evaluation             End of Session Equipment Utilized During Treatment: Gait belt Activity Tolerance: Patient limited by pain Patient left: in chair;with call bell/phone within reach     Time: 8413-2440 PT Time  Calculation (min): 15 min  Charges:  $Gait Training: 8-22 mins                    G Codes:      [place], [place]  Ellouise Newer 10/21/2013, 3:52 PM

## 2013-10-21 NOTE — Progress Notes (Signed)
Subjective: 2 Days Post-Op Procedure(s) (LRB): TOTAL KNEE ARTHROPLASTY (Right)  Activity level:  wbat Diet tolerance:  Eating well Voiding:  ok Patient reports pain as mild and moderate.    Objective: Vital signs in last 24 hours: Temp:  [98 F (36.7 C)-98.3 F (36.8 C)] 98.3 F (36.8 C) (09/24 7340) Pulse Rate:  [70-74] 70 (09/24 0638) Resp:  [16-18] 16 (09/24 3709) BP: (128-139)/(60-62) 128/60 mmHg (09/24 0638) SpO2:  [95 %-97 %] 95 % (09/24 0638)  Labs:  Recent Labs  10/19/13 1300 10/20/13 0454 10/21/13 0650  HGB 12.9 10.6* 10.6*    Recent Labs  10/20/13 0454 10/21/13 0650  WBC 14.2* 10.5  RBC 3.91 3.96  HCT 32.4* 33.1*  PLT 208 203    Recent Labs  10/19/13 1300 10/20/13 0454  NA  --  138  K  --  4.1  CL  --  100  CO2  --  27  BUN  --  11  CREATININE 0.82 0.93  GLUCOSE  --  122*  CALCIUM  --  8.6    Recent Labs  10/20/13 0454 10/21/13 0650  INR 1.27 1.52*    Physical Exam:  Neurologically intact ABD soft Neurovascular intact Sensation intact distally Intact pulses distally Dorsiflexion/Plantar flexion intact Incision: dressing C/D/I No cellulitis present Compartment soft  Assessment/Plan:  2 Days Post-Op Procedure(s) (LRB): TOTAL KNEE ARTHROPLASTY (Right) Advance diet Up with therapy Discharge to SNF today.  Continue on coumadin for A-fib and DVT prevention. Follow up in office 2 weeks post op.     Truth Barot, Larwance Sachs 10/21/2013, 1:29 PM

## 2013-10-21 NOTE — Clinical Social Work Placement (Signed)
Clinical Social Work Department CLINICAL SOCIAL WORK PLACEMENT NOTE 10/21/2013  Patient:  [name], [name]  Account Number:  [id number] Admit date:  10/19/2013  Clinical Social Worker:  Wylene Men  Date/time:  10/21/2013 10:38 AM  Clinical Social Work is seeking post-discharge placement for this patient at the following level of care:   [facility]   (*CSW will update this form in Epic as items are completed)   10/21/2013  Patient/family provided with [place] Department of Clinical Social Work's list of facilities offering this level of care within the geographic area requested by the patient (or if unable, by the patient's family).  10/21/2013  Patient/family informed of their freedom to choose among providers that offer the needed level of care, that participate in Medicare, Medicaid or managed care program needed by the patient, have an available bed and are willing to accept the patient.  10/21/2013  Patient/family informed of [facility]' ownership interest in [facility], as well as of the fact that they are under no obligation to receive care at this facility.  PASARR submitted to EDS on 10/21/2013 PASARR number received on 10/21/2013  FL2 transmitted to all facilities in geographic area requested by pt/family on  10/21/2013 FL2 transmitted to all facilities within larger geographic area on   Patient informed that his/her managed care company has contracts with or will negotiate with  certain facilities, including the following:     Patient/family informed of bed offers received:  10/21/2013 Patient chooses bed at [facility] Physician recommends and patient chooses bed at    Patient to be transferred to [facility] on  10/21/2013 Patient to be transferred to facility by [facility] Patient and family notified of transfer on  Name of family member notified:  none  The following physician request were entered in Epic:   Additional Comments: Pt  alert and oriented x4. No family notified.   Nonnie Done, [place] [phone]  Psychiatric & Orthopedics (5N 1-16) Clinical Social Worker

## 2013-10-21 NOTE — Progress Notes (Signed)
Physical Therapy Treatment Patient Details Name: [name] MRN: [id number] DOB: [birth year] Today's Date: 10/21/2013    History of Present Illness 80 y.o. female s/p right total knee arthroplasty. Hx of a-fib with pacemaker placement, chronic diastolic heart failure, and stroke.    PT Comments    Patient is progressing well towards physical therapy goals, ambulating up to 95 feet with min guard assist while using a rolling walker. Reviewed therapeutic exercises which she is tolerating well and ROM of right knee 4-93 degrees today. Patient will continue to benefit from skilled physical therapy services to further improve independence with functional mobility.    Follow Up Recommendations  SNF;Supervision for mobility/OOB     Equipment Recommendations  Rolling walker with 5" wheels;3in1 (PT)    Recommendations for Other Services       Precautions / Restrictions Precautions Precautions: Knee Precaution Comments: Reviewed knee precautions Required Braces or Orthoses: Knee Immobilizer - Right Restrictions Weight Bearing Restrictions: Yes RLE Weight Bearing: Weight bearing as tolerated    Mobility  Bed Mobility Overal bed mobility: Modified Independent             General bed mobility comments: Requires extra time no physical assist  Transfers Overall transfer level: Needs assistance Equipment used: Rolling walker (2 wheeled) Transfers: Sit to/from Stand Sit to Stand: Min guard         General transfer comment: Min guard for safety from lowest bed setting. VC for hand placement. Instructions for increased RLE use.  Ambulation/Gait Ambulation/Gait assistance: Min guard Ambulation Distance (Feet): 95 Feet Assistive device: Rolling walker (2 wheeled) Gait Pattern/deviations: Step-to pattern;Step-through pattern;Decreased step length - left;Decreased stance time - right;Antalgic   Gait velocity interpretation: Below normal speed for age/gender General Gait  Details: Instructions for step-through gait pattern which is slowly emerging at this point. No instance of knee buckling without knee immobilizer. Very slow and guarded. Progressed to continuously pushing RW while ambulating.   Stairs            Wheelchair Mobility    Modified Rankin (Stroke Patients Only)       Balance                                    Cognition Arousal/Alertness: Awake/alert Behavior During Therapy: WFL for tasks assessed/performed Overall Cognitive Status: Within Functional Limits for tasks assessed                      Exercises Total Joint Exercises Ankle Circles/Pumps: AROM;Both;10 reps;Seated Quad Sets: AROM;Right;10 reps;Seated Short Arc Quad: AROM;Right;10 reps;Seated Long Arc Quad: AROM;Right;10 reps;Seated Knee Flexion: Right;10 reps;Seated;AROM Goniometric ROM: 4-93 degrees right knee flexion in sitting    General Comments General comments (skin integrity, edema, etc.): Bilateral pitting edema - appears slightly improved from yesterday      Pertinent Vitals/Pain Pain Assessment: 0-10 Pain Score: 10-Worst pain ever Pain Location: Rt knee Pain Intervention(s): Limited activity within patient's tolerance;Monitored during session;Repositioned;RN gave pain meds during session    Home Living                      Prior Function            PT Goals (current goals can now be found in the care plan section) Acute Rehab PT Goals PT Goal Formulation: With patient Time For Goal Achievement: 10/26/13 Potential to Achieve Goals: Good Progress towards PT  goals: Progressing toward goals    Frequency  7X/week    PT Plan Current plan remains appropriate    Co-evaluation             End of Session Equipment Utilized During Treatment: Gait belt Activity Tolerance: Patient tolerated treatment well Patient left: in chair;with call bell/phone within reach     Time: 0955-1020 PT Time Calculation (min):  25 min  Charges:  $Gait Training: 8-22 mins $Therapeutic Exercise: 8-22 mins                    G Codes:      [employer], [place]  Ellouise Newer 10/21/2013, 10:38 AM

## 2013-10-21 NOTE — Clinical Social Work Note (Addendum)
Pt is being discharged today and admitted to STR/SNF. SNF: [facility] RN to call report to: [phone] Transportation: PTAR scheduled for 3pm.  CSW sent dc summary to SNF for review.  Nonnie Done, [place] [phone]  Psychiatric & Orthopedics (5N 1-16) Clinical Social Worker

## 2013-10-21 NOTE — Clinical Social Work Psychosocial (Signed)
Clinical Social Work Department BRIEF PSYCHOSOCIAL ASSESSMENT 10/21/2013  Patient:  [name], [name]     Account Number:  [id number]     Admit date:  10/19/2013  Clinical Social Worker:  Wylene Men  Date/Time:  10/21/2013 10:30 AM  Referred by:  Physician  Date Referred:  10/20/2013 Referred for  SNF Placement  Psychosocial assessment   Other Referral:   none   Interview type:  Patient Other interview type:   none    PSYCHOSOCIAL DATA Living Status:  ALONE Admitted from facility:   Level of care:   Primary support name:  [place] Primary support relationship to patient:  CHILD, ADULT Degree of support available:   strong    CURRENT CONCERNS Current Concerns  Post-Acute Placement   Other Concerns:   none    SOCIAL WORK ASSESSMENT / PLAN CSW assessed pt at bedside.  Pt was alert and oriented x4. PT is recommending SNF/STR upon being medically dc'd.  Pt is a part of the bundle program with her orthopedic surgeon and has completed admission paperwork with [facility], SNF prior to  admission.  Pt is agreeable to this disposition and is hopeful regarding her prognosis.  Pt is hopeful to return to independent living upon completion of STR.   Assessment/plan status:  Psychosocial Support/Ongoing Assessment of Needs Other assessment/ plan:   FL2  PASARR   Information/referral to community resources:   SNF  bundle program    PATIENT'S/FAMILY'S RESPONSE TO PLAN OF CARE: Pt is agreeable to SNF and has completed admission's paperwork with [facility].  Pt and family was appreciative of CSW support and assistance.       Nonnie Done, [place] [phone]  Psychiatric & Orthopedics (5N 1-16) Clinical Social Worker

## 2013-10-25 ENCOUNTER — Non-Acute Institutional Stay (SKILLED_NURSING_FACILITY): Payer: Medicare Other | Admitting: Adult Health

## 2013-10-25 DIAGNOSIS — I10 Essential (primary) hypertension: Secondary | ICD-10-CM | POA: Diagnosis not present

## 2013-10-25 DIAGNOSIS — M1710 Unilateral primary osteoarthritis, unspecified knee: Secondary | ICD-10-CM

## 2013-10-25 DIAGNOSIS — K5900 Constipation, unspecified: Secondary | ICD-10-CM

## 2013-10-25 DIAGNOSIS — F411 Generalized anxiety disorder: Secondary | ICD-10-CM

## 2013-10-25 DIAGNOSIS — I5032 Chronic diastolic (congestive) heart failure: Secondary | ICD-10-CM

## 2013-10-25 DIAGNOSIS — F419 Anxiety disorder, unspecified: Secondary | ICD-10-CM

## 2013-10-25 DIAGNOSIS — M1711 Unilateral primary osteoarthritis, right knee: Secondary | ICD-10-CM

## 2013-10-26 ENCOUNTER — Non-Acute Institutional Stay (SKILLED_NURSING_FACILITY): Payer: Medicare Other | Admitting: Internal Medicine

## 2013-10-26 ENCOUNTER — Encounter: Payer: Self-pay | Admitting: Adult Health

## 2013-10-26 ENCOUNTER — Ambulatory Visit: Payer: Medicare Other

## 2013-10-26 DIAGNOSIS — M1710 Unilateral primary osteoarthritis, unspecified knee: Secondary | ICD-10-CM | POA: Diagnosis not present

## 2013-10-26 DIAGNOSIS — M1711 Unilateral primary osteoarthritis, right knee: Secondary | ICD-10-CM

## 2013-10-26 DIAGNOSIS — I480 Paroxysmal atrial fibrillation: Secondary | ICD-10-CM

## 2013-10-26 DIAGNOSIS — I4891 Unspecified atrial fibrillation: Secondary | ICD-10-CM | POA: Diagnosis not present

## 2013-10-26 DIAGNOSIS — I5032 Chronic diastolic (congestive) heart failure: Secondary | ICD-10-CM

## 2013-10-26 DIAGNOSIS — D62 Acute posthemorrhagic anemia: Secondary | ICD-10-CM

## 2013-10-26 NOTE — Progress Notes (Signed)
Patient ID: [name], female   DOB: [birth year], 80 y.o.   MRN: [id number]   10/26/2013  Facility:  Nursing Home Location:  [place] Room Number: 808-P LEVEL OF CARE:  SNF (31)   Chief Complaint  Patient presents with  . New Admit To SNF    HISTORY OF PRESENT ILLNESS:  Being admitted to [facility] on 10/21/13 from [facility] with right knee DJD status post right total knee arthroplasty. She has been admitted for a short-term rehabilitation.  REASSESSMENT OF ONGOING PROBLEMS:  HTN: Pt 's HTN remains stable.  Denies CP, sob, DOE, headaches, dizziness or visual disturbances.  No complications from the medications currently being used.  Last BP : 141/69  CHF:The patient does not relate significant weight changes, denies sob, DOE, orthopnea, PNDs,  palpitations or chest pain.  CHF remains stable.  No complications form the medications being used.  ANXIETY: The anxiety remains stable. Patient denies ongoing anxiety or irritability. No complications reported from the medications currently being used.   PAST MEDICAL HISTORY:  Past Medical History  Diagnosis Date  . Depression   . Coronary atherosclerosis of native coronary artery   . Venous insufficiency   . Tachy-brady syndrome     With permanent pacemaker 04/03/99  . PAF (paroxysmal atrial fibrillation)     takes Coumadin daily  . Lichen planus   . Hypertension     takes Amlodipine and Diovan daily  . Chronic diastolic CHF (congestive heart failure)     takes Bumex daily  . Anxiety     takes Valium daily as needed  . Muscle spasm     takes Celebrex daily and Robaxin daily as needed  . Heart murmur   . TIA (transient ischemic attack)   . Pacemaker   . Pneumonia     as a child  . Dizziness     occasionally  . Joint pain   . Joint swelling   . Chronic back pain     stenosis  . History of gout     doesn't take any meds  . History of gastric ulcer   . History of colon  polyps   . Diverticulosis   . Glaucoma     uses eye drops daily  . Chronic bronchitis     "get it practically q yr"   . Tuberculosis     during childhood unsure of time  . Type II diabetes mellitus     but doesn't take any meds (10/19/2013)  . Stroke 1990's    "found it in ~ 1997-1998; never even knew I'd had one"  . Osteoarthritis   . Arthritis     "joints"  . GERD (gastroesophageal reflux disease)     CURRENT MEDICATIONS: Reviewed per MAR/see medication list  Allergies  Allergen Reactions  . Lasix [Furosemide] Other (See Comments)    "Too strong" - tolerates Bumex  . Levsin [Hyoscyamine Sulfate]   . Procardia [Nifedipine] Other (See Comments)    GI uspet  . Prednisone Palpitations and Other (See Comments)    Tachycardia, fluttering     REVIEW OF SYSTEMS:  GENERAL: no change in appetite, no fatigue, no weight changes, no fever, chills or weakness RESPIRATORY: no cough, SOB, DOE, wheezing, hemoptysis CARDIAC: no chest pain, or palpitations, +edema GI: no abdominal pain, diarrhea, constipation, heart burn, nausea or vomiting  PHYSICAL EXAMINATION  GENERAL: no acute distress, obese EYES: conjunctivae normal, sclerae normal, normal eye lids NECK: supple,  trachea midline, no neck masses, no thyroid tenderness, no thyromegaly LYMPHATICS: no LAN in the neck, no supraclavicular LAN RESPIRATORY: breathing is even & unlabored, BS CTAB CARDIAC: RRR, no murmur,no extra heart sounds, BLE edema 2+, +pacemaker GI: abdomen soft, normal BS, no masses, no tenderness, no hepatomegaly, no splenomegaly EXTREMITIES:  Able to move all 4 extremities PSYCHIATRIC: the patient is alert & oriented to person, affect & behavior appropriate  LABS/RADIOLOGY: Labs reviewed: Basic Metabolic Panel:  Recent Labs  10/12/13 1008 10/19/13 1300 10/20/13 0454  NA 144  --  138  K 4.0  --  4.1  CL 104  --  100  CO2 29  --  27  GLUCOSE 108*  --  122*  BUN 7  --  11  CREATININE 0.79 0.82 0.93    CALCIUM 9.4  --  8.6   CBC:  Recent Labs  10/12/13 1008 10/19/13 1300 10/20/13 0454 10/21/13 0650  WBC 5.3 12.3* 14.2* 10.5  NEUTROABS 3.3  --   --   --   HGB 13.2 12.9 10.6* 10.6*  HCT 41.1 39.6 32.4* 33.1*  MCV 83.9 85.5 82.9 83.6  PLT 226 232 208 203   CBG:  Recent Labs  10/20/13 2145 10/21/13 0637 10/21/13 1129  GLUCAP 145* 100* 109*    Dg Chest 2 View  10/12/2013   CLINICAL DATA:  80 year old female with previous history of tuberculosis. History of coronary artery disease hypertension, heart murmur, diabetes. Currently presenting preoperative for total knee arthroplasty.  EXAM: CHEST  2 VIEW  COMPARISON:  Chest x-ray 05/12/2012, CT 10/09/2010  FINDINGS: Cardiac size unchanged from comparison. Tortuosity of descending thoracic aorta again noted. Atherosclerotic calcifications of the aortic arch.  Re- demonstration of cardiac pacing device on left chest wall with 2 leads in place, terminating in the region the right atrium and overlying the left ventricle.  Interstitial opacities persist without evidence of confluent airspace disease. Mild flattening of the hemidiaphragms. No pneumothorax or pleural effusion.  No acute bony abnormality identified. Changes of previous spine surgery.  IMPRESSION: No radiographic evidence of acute cardiopulmonary disease, with background of chronic changes.  Atherosclerosis. Evidence of chronic hypertension with tortuous thoracic aorta.  Unchanged position of left chest wall cardiac pacing device with 2 leads in place.  Signed,  Dulcy Fanny. Earleen Newport, DO  [facility]  [facility]   Electronically Signed   By: Corrie Mckusick O.D.   On: 10/12/2013 12:02    ASSESSMENT/PLAN:  Right Knee DJD S/P Right Total Knee Arthroplasty - for rehabilitation Hypertension - continue Norvasc and Diovan Chronic diastolic CHF - stable; continue Bumex Anxiety - continue Valium when necessary Constipation - continue  senna    CPT CODE: 51884    [name],Brandy Natal, NP [place]

## 2013-10-27 ENCOUNTER — Non-Acute Institutional Stay (SKILLED_NURSING_FACILITY): Payer: Medicare Other | Admitting: Adult Health

## 2013-10-27 ENCOUNTER — Encounter: Payer: Self-pay | Admitting: Adult Health

## 2013-10-27 DIAGNOSIS — M1711 Unilateral primary osteoarthritis, right knee: Secondary | ICD-10-CM

## 2013-10-27 DIAGNOSIS — I5032 Chronic diastolic (congestive) heart failure: Secondary | ICD-10-CM | POA: Diagnosis not present

## 2013-10-27 DIAGNOSIS — I4891 Unspecified atrial fibrillation: Secondary | ICD-10-CM

## 2013-10-27 DIAGNOSIS — I480 Paroxysmal atrial fibrillation: Secondary | ICD-10-CM

## 2013-10-27 DIAGNOSIS — M1710 Unilateral primary osteoarthritis, unspecified knee: Secondary | ICD-10-CM | POA: Diagnosis not present

## 2013-10-27 DIAGNOSIS — I10 Essential (primary) hypertension: Secondary | ICD-10-CM

## 2013-10-27 NOTE — Progress Notes (Addendum)
Patient ID: [name], female   DOB: May 22, 1932, 80 y.o.   MRN: [id number]   10/27/2013  Facility:  Nursing Home Location:  [place] Room Number: 808-P LEVEL OF CARE:  SNF (31)   Chief Complaint  Patient presents with  . Discharge Note    HISTORY OF PRESENT ILLNESS:  This is a 80 year old female who is for discharge home with home health PT, Nursing and CNA. DME: Rolling walker and bedside commode. She has been admitted Being admitted to [facility] on 10/21/13 from [facility] with right knee DJD status post right total knee arthroplasty. Patient was admitted to this facility for short-term rehabilitation after the patient's recent hospitalization.  Patient has completed SNF rehabilitation and therapy has cleared the patient for discharge.  REASSESSMENT OF ONGOING PROBLEMS:  HTN: Pt 's HTN remains stable.  Denies CP, sob, DOE, headaches, dizziness or visual disturbances.  No complications from the medications currently being used.  Last BP : 143/68  ATRIAL FIBRILLATION: the patients atrial fibrillation remains stable.  The patient denies DOE, tachycardia, orthopnea, transient neurological sx, palpitations, & PNDs.  No complications noted from the medications currently being used.  CHF:The patient does not relate significant weight changes, denies sob, DOE, orthopnea, PNDs,  palpitations or chest pain.  CHF remains stable.  No complications form the medications being used.  PAST MEDICAL HISTORY:  Past Medical History  Diagnosis Date  . Depression   . Coronary atherosclerosis of native coronary artery   . Venous insufficiency   . Tachy-brady syndrome     With permanent pacemaker 04/03/99  . PAF (paroxysmal atrial fibrillation)     takes Coumadin daily  . Lichen planus   . Hypertension     takes Amlodipine and Diovan daily  . Chronic diastolic CHF (congestive heart failure)     takes Bumex daily  . Anxiety     takes Valium daily as  needed  . Muscle spasm     takes Celebrex daily and Robaxin daily as needed  . Heart murmur   . TIA (transient ischemic attack)   . Pacemaker   . Pneumonia     as a child  . Dizziness     occasionally  . Joint pain   . Joint swelling   . Chronic back pain     stenosis  . History of gout     doesn't take any meds  . History of gastric ulcer   . History of colon polyps   . Diverticulosis   . Glaucoma     uses eye drops daily  . Chronic bronchitis     "get it practically q yr"   . Tuberculosis     during childhood unsure of time  . Type II diabetes mellitus     but doesn't take any meds (10/19/2013)  . Stroke 1990's    "found it in ~ 1997-1998; never even knew I'd had one"  . Osteoarthritis   . Arthritis     "joints"  . GERD (gastroesophageal reflux disease)     CURRENT MEDICATIONS: Reviewed per MAR/see medication list  Allergies  Allergen Reactions  . Lasix [Furosemide] Other (See Comments)    "Too strong" - tolerates Bumex  . Levsin [Hyoscyamine Sulfate]   . Procardia [Nifedipine] Other (See Comments)    GI uspet  . Prednisone Palpitations and Other (See Comments)    Tachycardia, fluttering     REVIEW OF SYSTEMS:  GENERAL: no change in  appetite, no fatigue, no weight changes, no fever, chills or weakness RESPIRATORY: no cough, SOB, DOE, wheezing, hemoptysis CARDIAC: no chest pain, or palpitations, +edema GI: no abdominal pain, diarrhea, constipation, heart burn, nausea or vomiting  PHYSICAL EXAMINATION  GENERAL: no acute distress, obese NECK: supple, trachea midline, no neck masses, no thyroid tenderness, no thyromegaly LYMPHATICS: no LAN in the neck, no supraclavicular LAN RESPIRATORY: breathing is even & unlabored, BS CTAB CARDIAC: RRR, no murmur,no extra heart sounds, BLE edema 2+, +pacemaker GI: abdomen soft, normal BS, no masses, no tenderness, no hepatomegaly, no splenomegaly EXTREMITIES:  Able to move all 4 extremities PSYCHIATRIC: the patient is  alert & oriented to person, affect & behavior appropriate  LABS/RADIOLOGY: Labs reviewed: Basic Metabolic Panel:  Recent Labs  10/12/13 1008 10/19/13 1300 10/20/13 0454  NA 144  --  138  K 4.0  --  4.1  CL 104  --  100  CO2 29  --  27  GLUCOSE 108*  --  122*  BUN 7  --  11  CREATININE 0.79 0.82 0.93  CALCIUM 9.4  --  8.6   CBC:  Recent Labs  10/12/13 1008 10/19/13 1300 10/20/13 0454 10/21/13 0650  WBC 5.3 12.3* 14.2* 10.5  NEUTROABS 3.3  --   --   --   HGB 13.2 12.9 10.6* 10.6*  HCT 41.1 39.6 32.4* 33.1*  MCV 83.9 85.5 82.9 83.6  PLT 226 232 208 203   CBG:  Recent Labs  10/20/13 2145 10/21/13 0637 10/21/13 1129  GLUCAP 145* 100* 109*    Dg Chest 2 View  10/12/2013   CLINICAL DATA:  80 year old female with previous history of tuberculosis. History of coronary artery disease hypertension, heart murmur, diabetes. Currently presenting preoperative for total knee arthroplasty.  EXAM: CHEST  2 VIEW  COMPARISON:  Chest x-ray 05/12/2012, CT 10/09/2010  FINDINGS: Cardiac size unchanged from comparison. Tortuosity of descending thoracic aorta again noted. Atherosclerotic calcifications of the aortic arch.  Re- demonstration of cardiac pacing device on left chest wall with 2 leads in place, terminating in the region the right atrium and overlying the left ventricle.  Interstitial opacities persist without evidence of confluent airspace disease. Mild flattening of the hemidiaphragms. No pneumothorax or pleural effusion.  No acute bony abnormality identified. Changes of previous spine surgery.  IMPRESSION: No radiographic evidence of acute cardiopulmonary disease, with background of chronic changes.  Atherosclerosis. Evidence of chronic hypertension with tortuous thoracic aorta.  Unchanged position of left chest wall cardiac pacing device with 2 leads in place.  Signed,  Dulcy Fanny. Earleen Newport, DO  [facility]  [facility]   Electronically  Signed   By: Corrie Mckusick O.D.   On: 10/12/2013 12:02    ASSESSMENT/PLAN:  Right Knee DJD S/P Right Total Knee Arthroplasty - for home health nursing and CNA Hypertension - continue Norvasc and Diovan Chronic diastolic CHF - stable; continue Bumex Paroxysmal atrial fibrillation - rate controlled; continue Coumadin  Anxiety - continue Valium when necessary Constipation - continue senna  I have filled out patient's discharge paperwork and written prescriptions.  Patient will receive home health PT,Nursing and CNA.  DME provided: Rolling walker and bedside commode  Total discharge time: Greater than 30 minutes  Discharge time involved coordination of the discharge process with Education officer, museum, nursing staff and therapy department. Medical justification for home health services/DME verified.  CPT CODE: 16109    [name],[name], [facility] [phone]

## 2013-10-27 NOTE — Progress Notes (Signed)
HISTORY & PHYSICAL  DATE: 10/26/2013   FACILITY: [facility]  LEVEL OF CARE: SNF (31)  ALLERGIES:  Allergies  Allergen Reactions  . Lasix [Furosemide] Other (See Comments)    "Too strong" - tolerates Bumex  . Levsin [Hyoscyamine Sulfate]   . Procardia [Nifedipine] Other (See Comments)    GI uspet  . Prednisone Palpitations and Other (See Comments)    Tachycardia, fluttering    CHIEF COMPLAINT:  Manage right knee osteoarthritis, atrial fibrillation and CHF  HISTORY OF PRESENT ILLNESS: Patient is a 80 year old African American female.  KNEE OSTEOARTHRITIS: Patient had a history of pain and functional disability in the knee due to end-stage osteoarthritis and has failed nonsurgical conservative treatments. Patient had worsening of pain with activity and weight bearing, pain that interfered with activities of daily living & pain with passive range of motion. Therefore patient underwent total knee arthroplasty and tolerated the procedure well. Patient is admitted to this facility for sort short-term rehabilitation. Patient denies knee pain.  ATRIAL FIBRILLATION: the patients atrial fibrillation remains stable.  The patient denies DOE, tachycardia, orthopnea, transient neurological sx, palpitations, & PNDs.  No complications noted from the medications currently being used.  CHF:The patient does not relate significant weight changes, denies sob, DOE, orthopnea, PNDs, palpitations or chest pain.  CHF remains stable.  No complications form the medications being used. Complains of bilateral lower extremity swelling.  PAST MEDICAL HISTORY :  Past Medical History  Diagnosis Date  . Depression   . Coronary atherosclerosis of native coronary artery   . Venous insufficiency   . Tachy-brady syndrome     With permanent pacemaker 04/03/99  . PAF (paroxysmal atrial fibrillation)     takes Coumadin daily  . Lichen planus   . Hypertension     takes Amlodipine and  Diovan daily  . Chronic diastolic CHF (congestive heart failure)     takes Bumex daily  . Anxiety     takes Valium daily as needed  . Muscle spasm     takes Celebrex daily and Robaxin daily as needed  . Heart murmur   . TIA (transient ischemic attack)   . Pacemaker   . Pneumonia     as a child  . Dizziness     occasionally  . Joint pain   . Joint swelling   . Chronic back pain     stenosis  . History of gout     doesn't take any meds  . History of gastric ulcer   . History of colon polyps   . Diverticulosis   . Glaucoma     uses eye drops daily  . Chronic bronchitis     "get it practically q yr"   . Tuberculosis     during childhood unsure of time  . Type II diabetes mellitus     but doesn't take any meds (10/19/2013)  . Stroke 1990's    "found it in ~ 1997-1998; never even knew I'd had one"  . Osteoarthritis   . Arthritis     "joints"  . GERD (gastroesophageal reflux disease)     PAST SURGICAL HISTORY: Past Surgical History  Procedure Laterality Date  . Tonsillectomy    . Tubal ligation  1971  . Cataract extraction w/ intraocular lens  implant, bilateral Bilateral ?2005  . Back surgery    . Lumbar disc surgery  08/2006  . Cardiac catheterization  X 2  . Colonoscopy    .  Esophagogastroduodenoscopy    . Total knee arthroplasty Right 10/19/2013  . Abdominal hysterectomy      w/BSO  . Insert / replace / remove pacemaker  04/03/99; ?2008  . Total knee arthroplasty Right 10/19/2013    Procedure: TOTAL KNEE ARTHROPLASTY;  Surgeon: Hessie Dibble, MD;  Location: [facility];  Service: Orthopedics;  Laterality: Right;    SOCIAL HISTORY:  reports that she has never smoked. She has never used smokeless tobacco. She reports that she does not drink alcohol or use illicit drugs.  FAMILY HISTORY:  Family History  Problem Relation Age of Onset  . Malignant hyperthermia Neg Hx   . Hypertension Mother   . Cancer Mother     Maglignant Brain Tumor  . Heart disease Father   .  Hypertension Father   . Heart failure Sister   . Diabetes Sister     CURRENT MEDICATIONS: Reviewed per MAR/see medication list  REVIEW OF SYSTEMS:  See HPI otherwise 14 point ROS is negative.  PHYSICAL EXAMINATION  VS:  See VS section  GENERAL: no acute distress, obese body habitus EYES: conjunctivae normal, sclerae normal, normal eye lids MOUTH/THROAT: lips without lesions,no lesions in the mouth,tongue is without lesions,uvula elevates in midline NECK: supple, trachea midline, no neck masses, no thyroid tenderness, no thyromegaly LYMPHATICS: no LAN in the neck, no supraclavicular LAN RESPIRATORY: breathing is even & unlabored, BS CTAB CARDIAC: Heart rate is irregularly irregular, no murmur,no extra heart sounds, right lower extremity +3 edema and left lower extremity +2 edema GI:  ABDOMEN: abdomen soft, normal BS, no masses, no tenderness  LIVER/SPLEEN: no hepatomegaly, no splenomegaly MUSCULOSKELETAL: HEAD: normal to inspection  EXTREMITIES: LEFT UPPER EXTREMITY: full range of motion, normal strength & tone RIGHT UPPER EXTREMITY:  range of motion not tested due to surgery, normal strength & tone LEFT LOWER EXTREMITY:  Moderate range of motion, normal strength & tone RIGHT LOWER EXTREMITY:  full range of motion, normal strength & tone PSYCHIATRIC: the patient is alert & oriented to person, affect & behavior appropriate  LABS/RADIOLOGY:  Labs reviewed: Basic Metabolic Panel:  Recent Labs  10/12/13 1008 10/19/13 1300 10/20/13 0454  NA 144  --  138  K 4.0  --  4.1  CL 104  --  100  CO2 29  --  27  GLUCOSE 108*  --  122*  BUN 7  --  11  CREATININE 0.79 0.82 0.93  CALCIUM 9.4  --  8.6   CBC:  Recent Labs  10/12/13 1008 10/19/13 1300 10/20/13 0454 10/21/13 0650  WBC 5.3 12.3* 14.2* 10.5  NEUTROABS 3.3  --   --   --   HGB 13.2 12.9 10.6* 10.6*  HCT 41.1 39.6 32.4* 33.1*  MCV 83.9 85.5 82.9 83.6  PLT 226 232 208 203   CBG:  Recent Labs  10/20/13 2145  10/21/13 0637 10/21/13 1129  GLUCAP 145* 100* 109*    Transthoracic Echocardiography  Patient:    [name], [name] MR #:       [id number] Study Date: 08/31/2012 Gender:     F Age:        62 Height:     157.5cm Weight:     89.1kg BSA:        2.97m^2 Pt. Status: Room:       [facility]  ADMITTING    Domenic Polite  ATTENDING    Domenic Polite  Doloris Hall, Jacinta Shoe  Sterrett, Preetha  SONOGRAPHER  Leavy Cella cc:  ------------------------------------------------------------ LV EF: 55% -   60%  ------------------------------------------------------------ History:   PMH:  Paroxysmal Atrial Fibrillation, Pacemaker, HTN, Dizzy, Gout, TIA Dizziness  ------------------------------------------------------------ Study Conclusions  - Left ventricle: The cavity size was normal. There was mild   concentric hypertrophy. Systolic function was normal. The   estimated ejection fraction was in the range of 55% to   60%. Wall motion was normal; there were no regional wall   motion abnormalities. - Aortic valve: Mild regurgitation. - Left atrium: The atrium was mildly dilated. - Right ventricle: The cavity size was mildly dilated. Wall   thickness was normal. - Right atrium: The atrium was mildly dilated. Transthoracic echocardiography.  M-mode, complete 2D, spectral Doppler, and color Doppler.  Height:  Height: 157.5cm. Height: 62in.  Weight:  Weight: 89.1kg. Weight: 196lb.  Body mass index:  BMI: 35.9kg/m^2.  Body surface area:    BSA: 2.66m^2.  Blood pressure:     156/77.  Patient status:  Inpatient.  Location:  Bedside.  ------------------------------------------------------------  ------------------------------------------------------------ Left ventricle:  The cavity size was normal. There was mild concentric hypertrophy. Systolic function was normal. The estimated ejection fraction was in the range of 55% to  60%. Wall motion was normal; there were no regional wall motion abnormalities.  ------------------------------------------------------------ Aortic valve:  Poorly visualized.  Trileaflet; mildly thickened, mildly calcified leaflets. Mobility was not restricted.  Doppler:  Transvalvular velocity was within the normal range. There was no stenosis.  Mild regurgitation.   ------------------------------------------------------------ Aorta:  Aortic root: The aortic root was normal in size.  ------------------------------------------------------------ Mitral valve:   Structurally normal valve.   Mobility was not restricted.  Doppler:  Transvalvular velocity was within the normal range. There was no evidence for stenosis. Trivial regurgitation.    Peak gradient: 43mm Hg (D).  ------------------------------------------------------------ Left atrium:  The atrium was mildly dilated.  ------------------------------------------------------------ Right ventricle:  The cavity size was mildly dilated. Wall thickness was normal. Pacer wire or catheter noted in right ventricle. Systolic function was normal.  ------------------------------------------------------------ Pulmonic valve:    Doppler:  Transvalvular velocity was within the normal range. There was no evidence for stenosis.   ------------------------------------------------------------ Tricuspid valve:   Structurally normal valve.    Doppler: Transvalvular velocity was within the normal range.  Mild regurgitation.  ------------------------------------------------------------ Pulmonary artery:   The main pulmonary artery was normal-sized. Systolic pressure was within the normal range.   ------------------------------------------------------------ Right atrium:  The atrium was mildly dilated. Pacer wire or catheter noted in right atrium.  ------------------------------------------------------------ Pericardium:  There was no  pericardial effusion.  ------------------------------------------------------------ Systemic veins: Inferior vena cava: The vessel was normal in size.  ------------------------------------------------------------  2D measurements        Normal  Doppler measurements   Normal Left ventricle                 Left ventricle LVID ED,   34.1 mm     43-52   Ea, lat ann,  10. cm/s ------ chord,                         tiss DP         7 PLAX                           E/Ea, lat     9.2      ------ LVID ES,  23.8 mm     23-38   ann, tiss DP    9 chord,                         Ea, med ann,   16 cm/s ------ PLAX                           tiss DP FS, chord,   30 %      >29     E/Ea, med     6.2      ------ PLAX                           ann, tiss DP    1 LVPW, ED   16.4 mm     ------  Aortic valve IVS/LVPW   0.82        <1.3    Regurg PHT    529 ms   ------ ratio, ED                      Mitral valve Ventricular septum             Peak E vel    99. cm/s ------ IVS, ED    13.4 mm     ------                  4 Aorta                          Peak A vel    73. cm/s ------ Root diam,   28 mm     ------                  7 ED                             Deceleration  278 ms   150-23 Left atrium                    time                   0 AP dim       41 mm     ------  Peak            4 mm   ------ AP dim     2.03 cm/m^2 <2.2    gradient, D       Hg index                          Peak E/A      1.3      ------                                ratio                                Tricuspid valve                                Regurg peak   304 cm/s ------  vel                                Peak RV-RA     37 mm   ------                                gradient, S       Hg                                Max regurg    304 cm/s ------                                vel   CHEST  2 VIEW   COMPARISON:  Chest x-ray 05/12/2012, CT 10/09/2010   FINDINGS: Cardiac size unchanged  from comparison. Tortuosity of descending thoracic aorta again noted. Atherosclerotic calcifications of the aortic arch.   Re- demonstration of cardiac pacing device on left chest wall with 2 leads in place, terminating in the region the right atrium and overlying the left ventricle.   Interstitial opacities persist without evidence of confluent airspace disease. Mild flattening of the hemidiaphragms. No pneumothorax or pleural effusion.   No acute bony abnormality identified. Changes of previous spine surgery.   IMPRESSION: No radiographic evidence of acute cardiopulmonary disease, with background of chronic changes.   Atherosclerosis. Evidence of chronic hypertension with tortuous thoracic aorta.   Unchanged position of left chest wall cardiac pacing device with 2 leads in place.    ASSESSMENT/PLAN:  Right knee osteoarthritis-status post total knee arthroplasty. Continue rehabilitation. Atrial fibrillation-rate controlled CHF-compensated Acute blood loss anemia-check hemoglobin Constipation-Colace and senna started CAD-stable Hypokalemia-continue supplementation Check CBC  I have reviewed patient's medical records received at admission/from hospitalization.  CPT CODE: 50388  [name] Y Luay Balding, [place] [phone]

## 2013-10-28 DIAGNOSIS — R278 Other lack of coordination: Secondary | ICD-10-CM | POA: Diagnosis not present

## 2013-10-28 DIAGNOSIS — F419 Anxiety disorder, unspecified: Secondary | ICD-10-CM | POA: Diagnosis not present

## 2013-10-28 DIAGNOSIS — R2689 Other abnormalities of gait and mobility: Secondary | ICD-10-CM | POA: Diagnosis not present

## 2013-10-28 DIAGNOSIS — M6281 Muscle weakness (generalized): Secondary | ICD-10-CM | POA: Diagnosis not present

## 2013-10-28 DIAGNOSIS — H409 Unspecified glaucoma: Secondary | ICD-10-CM | POA: Diagnosis not present

## 2013-10-28 DIAGNOSIS — M1710 Unilateral primary osteoarthritis, unspecified knee: Secondary | ICD-10-CM | POA: Diagnosis not present

## 2013-10-28 DIAGNOSIS — I10 Essential (primary) hypertension: Secondary | ICD-10-CM | POA: Diagnosis not present

## 2013-10-28 DIAGNOSIS — I5032 Chronic diastolic (congestive) heart failure: Secondary | ICD-10-CM | POA: Diagnosis not present

## 2013-10-28 DIAGNOSIS — Z471 Aftercare following joint replacement surgery: Secondary | ICD-10-CM | POA: Diagnosis not present

## 2013-10-28 DIAGNOSIS — Z96659 Presence of unspecified artificial knee joint: Secondary | ICD-10-CM | POA: Diagnosis not present

## 2013-10-28 DIAGNOSIS — K5900 Constipation, unspecified: Secondary | ICD-10-CM | POA: Diagnosis not present

## 2013-10-28 DIAGNOSIS — I2510 Atherosclerotic heart disease of native coronary artery without angina pectoris: Secondary | ICD-10-CM | POA: Diagnosis not present

## 2013-10-31 DIAGNOSIS — E119 Type 2 diabetes mellitus without complications: Secondary | ICD-10-CM | POA: Diagnosis not present

## 2013-10-31 DIAGNOSIS — I10 Essential (primary) hypertension: Secondary | ICD-10-CM | POA: Diagnosis not present

## 2013-10-31 DIAGNOSIS — Z471 Aftercare following joint replacement surgery: Secondary | ICD-10-CM | POA: Diagnosis not present

## 2013-10-31 DIAGNOSIS — M6281 Muscle weakness (generalized): Secondary | ICD-10-CM | POA: Diagnosis not present

## 2013-10-31 DIAGNOSIS — M1990 Unspecified osteoarthritis, unspecified site: Secondary | ICD-10-CM | POA: Diagnosis not present

## 2013-10-31 DIAGNOSIS — I5032 Chronic diastolic (congestive) heart failure: Secondary | ICD-10-CM | POA: Diagnosis not present

## 2013-11-01 DIAGNOSIS — Z96651 Presence of right artificial knee joint: Secondary | ICD-10-CM | POA: Diagnosis not present

## 2013-11-01 DIAGNOSIS — Z471 Aftercare following joint replacement surgery: Secondary | ICD-10-CM | POA: Diagnosis not present

## 2013-11-02 ENCOUNTER — Ambulatory Visit (INDEPENDENT_AMBULATORY_CARE_PROVIDER_SITE_OTHER): Payer: Medicare Other | Admitting: Cardiology

## 2013-11-02 DIAGNOSIS — I10 Essential (primary) hypertension: Secondary | ICD-10-CM | POA: Diagnosis not present

## 2013-11-02 DIAGNOSIS — M1990 Unspecified osteoarthritis, unspecified site: Secondary | ICD-10-CM | POA: Diagnosis not present

## 2013-11-02 DIAGNOSIS — E119 Type 2 diabetes mellitus without complications: Secondary | ICD-10-CM | POA: Diagnosis not present

## 2013-11-02 DIAGNOSIS — M6281 Muscle weakness (generalized): Secondary | ICD-10-CM | POA: Diagnosis not present

## 2013-11-02 DIAGNOSIS — I4891 Unspecified atrial fibrillation: Secondary | ICD-10-CM

## 2013-11-02 DIAGNOSIS — Z5181 Encounter for therapeutic drug level monitoring: Secondary | ICD-10-CM

## 2013-11-02 DIAGNOSIS — I5032 Chronic diastolic (congestive) heart failure: Secondary | ICD-10-CM | POA: Diagnosis not present

## 2013-11-02 DIAGNOSIS — Z471 Aftercare following joint replacement surgery: Secondary | ICD-10-CM | POA: Diagnosis not present

## 2013-11-02 LAB — POCT INR: INR: 1.4

## 2013-11-04 DIAGNOSIS — I10 Essential (primary) hypertension: Secondary | ICD-10-CM | POA: Diagnosis not present

## 2013-11-04 DIAGNOSIS — M6281 Muscle weakness (generalized): Secondary | ICD-10-CM | POA: Diagnosis not present

## 2013-11-04 DIAGNOSIS — I5032 Chronic diastolic (congestive) heart failure: Secondary | ICD-10-CM | POA: Diagnosis not present

## 2013-11-04 DIAGNOSIS — M1990 Unspecified osteoarthritis, unspecified site: Secondary | ICD-10-CM | POA: Diagnosis not present

## 2013-11-04 DIAGNOSIS — Z471 Aftercare following joint replacement surgery: Secondary | ICD-10-CM | POA: Diagnosis not present

## 2013-11-04 DIAGNOSIS — E119 Type 2 diabetes mellitus without complications: Secondary | ICD-10-CM | POA: Diagnosis not present

## 2013-11-05 DIAGNOSIS — I5032 Chronic diastolic (congestive) heart failure: Secondary | ICD-10-CM | POA: Diagnosis not present

## 2013-11-05 DIAGNOSIS — M1990 Unspecified osteoarthritis, unspecified site: Secondary | ICD-10-CM | POA: Diagnosis not present

## 2013-11-05 DIAGNOSIS — M6281 Muscle weakness (generalized): Secondary | ICD-10-CM | POA: Diagnosis not present

## 2013-11-05 DIAGNOSIS — I10 Essential (primary) hypertension: Secondary | ICD-10-CM | POA: Diagnosis not present

## 2013-11-05 DIAGNOSIS — Z471 Aftercare following joint replacement surgery: Secondary | ICD-10-CM | POA: Diagnosis not present

## 2013-11-05 DIAGNOSIS — E119 Type 2 diabetes mellitus without complications: Secondary | ICD-10-CM | POA: Diagnosis not present

## 2013-11-08 ENCOUNTER — Ambulatory Visit (INDEPENDENT_AMBULATORY_CARE_PROVIDER_SITE_OTHER): Payer: Medicare Other | Admitting: Internal Medicine

## 2013-11-08 DIAGNOSIS — I10 Essential (primary) hypertension: Secondary | ICD-10-CM | POA: Diagnosis not present

## 2013-11-08 DIAGNOSIS — M6281 Muscle weakness (generalized): Secondary | ICD-10-CM | POA: Diagnosis not present

## 2013-11-08 DIAGNOSIS — E119 Type 2 diabetes mellitus without complications: Secondary | ICD-10-CM | POA: Diagnosis not present

## 2013-11-08 DIAGNOSIS — I4891 Unspecified atrial fibrillation: Secondary | ICD-10-CM

## 2013-11-08 DIAGNOSIS — Z471 Aftercare following joint replacement surgery: Secondary | ICD-10-CM | POA: Diagnosis not present

## 2013-11-08 DIAGNOSIS — I5032 Chronic diastolic (congestive) heart failure: Secondary | ICD-10-CM | POA: Diagnosis not present

## 2013-11-08 DIAGNOSIS — Z5181 Encounter for therapeutic drug level monitoring: Secondary | ICD-10-CM

## 2013-11-08 DIAGNOSIS — M1990 Unspecified osteoarthritis, unspecified site: Secondary | ICD-10-CM | POA: Diagnosis not present

## 2013-11-08 LAB — POCT INR: INR: 2.6

## 2013-11-10 DIAGNOSIS — Z471 Aftercare following joint replacement surgery: Secondary | ICD-10-CM | POA: Diagnosis not present

## 2013-11-10 DIAGNOSIS — I10 Essential (primary) hypertension: Secondary | ICD-10-CM | POA: Diagnosis not present

## 2013-11-10 DIAGNOSIS — I5032 Chronic diastolic (congestive) heart failure: Secondary | ICD-10-CM | POA: Diagnosis not present

## 2013-11-10 DIAGNOSIS — M1990 Unspecified osteoarthritis, unspecified site: Secondary | ICD-10-CM | POA: Diagnosis not present

## 2013-11-10 DIAGNOSIS — M6281 Muscle weakness (generalized): Secondary | ICD-10-CM | POA: Diagnosis not present

## 2013-11-10 DIAGNOSIS — E119 Type 2 diabetes mellitus without complications: Secondary | ICD-10-CM | POA: Diagnosis not present

## 2013-11-12 DIAGNOSIS — E119 Type 2 diabetes mellitus without complications: Secondary | ICD-10-CM | POA: Diagnosis not present

## 2013-11-12 DIAGNOSIS — M1990 Unspecified osteoarthritis, unspecified site: Secondary | ICD-10-CM | POA: Diagnosis not present

## 2013-11-12 DIAGNOSIS — M6281 Muscle weakness (generalized): Secondary | ICD-10-CM | POA: Diagnosis not present

## 2013-11-12 DIAGNOSIS — I10 Essential (primary) hypertension: Secondary | ICD-10-CM | POA: Diagnosis not present

## 2013-11-12 DIAGNOSIS — I5032 Chronic diastolic (congestive) heart failure: Secondary | ICD-10-CM | POA: Diagnosis not present

## 2013-11-12 DIAGNOSIS — Z471 Aftercare following joint replacement surgery: Secondary | ICD-10-CM | POA: Diagnosis not present

## 2013-11-15 ENCOUNTER — Ambulatory Visit (INDEPENDENT_AMBULATORY_CARE_PROVIDER_SITE_OTHER): Payer: Medicare Other | Admitting: Cardiology

## 2013-11-15 DIAGNOSIS — E119 Type 2 diabetes mellitus without complications: Secondary | ICD-10-CM | POA: Diagnosis not present

## 2013-11-15 DIAGNOSIS — I4891 Unspecified atrial fibrillation: Secondary | ICD-10-CM

## 2013-11-15 DIAGNOSIS — Z5181 Encounter for therapeutic drug level monitoring: Secondary | ICD-10-CM

## 2013-11-15 DIAGNOSIS — I5032 Chronic diastolic (congestive) heart failure: Secondary | ICD-10-CM | POA: Diagnosis not present

## 2013-11-15 DIAGNOSIS — I10 Essential (primary) hypertension: Secondary | ICD-10-CM | POA: Diagnosis not present

## 2013-11-15 DIAGNOSIS — Z471 Aftercare following joint replacement surgery: Secondary | ICD-10-CM | POA: Diagnosis not present

## 2013-11-15 DIAGNOSIS — M1990 Unspecified osteoarthritis, unspecified site: Secondary | ICD-10-CM | POA: Diagnosis not present

## 2013-11-15 DIAGNOSIS — M6281 Muscle weakness (generalized): Secondary | ICD-10-CM | POA: Diagnosis not present

## 2013-11-15 LAB — POCT INR: INR: 3.4

## 2013-11-16 DIAGNOSIS — I10 Essential (primary) hypertension: Secondary | ICD-10-CM | POA: Diagnosis not present

## 2013-11-16 DIAGNOSIS — M6281 Muscle weakness (generalized): Secondary | ICD-10-CM | POA: Diagnosis not present

## 2013-11-16 DIAGNOSIS — I5032 Chronic diastolic (congestive) heart failure: Secondary | ICD-10-CM | POA: Diagnosis not present

## 2013-11-16 DIAGNOSIS — E119 Type 2 diabetes mellitus without complications: Secondary | ICD-10-CM | POA: Diagnosis not present

## 2013-11-16 DIAGNOSIS — M1990 Unspecified osteoarthritis, unspecified site: Secondary | ICD-10-CM | POA: Diagnosis not present

## 2013-11-16 DIAGNOSIS — Z471 Aftercare following joint replacement surgery: Secondary | ICD-10-CM | POA: Diagnosis not present

## 2013-11-18 DIAGNOSIS — M1990 Unspecified osteoarthritis, unspecified site: Secondary | ICD-10-CM | POA: Diagnosis not present

## 2013-11-18 DIAGNOSIS — E119 Type 2 diabetes mellitus without complications: Secondary | ICD-10-CM | POA: Diagnosis not present

## 2013-11-18 DIAGNOSIS — I5032 Chronic diastolic (congestive) heart failure: Secondary | ICD-10-CM | POA: Diagnosis not present

## 2013-11-18 DIAGNOSIS — M6281 Muscle weakness (generalized): Secondary | ICD-10-CM | POA: Diagnosis not present

## 2013-11-18 DIAGNOSIS — I10 Essential (primary) hypertension: Secondary | ICD-10-CM | POA: Diagnosis not present

## 2013-11-18 DIAGNOSIS — Z471 Aftercare following joint replacement surgery: Secondary | ICD-10-CM | POA: Diagnosis not present

## 2013-11-19 DIAGNOSIS — I10 Essential (primary) hypertension: Secondary | ICD-10-CM | POA: Diagnosis not present

## 2013-11-19 DIAGNOSIS — Z471 Aftercare following joint replacement surgery: Secondary | ICD-10-CM | POA: Diagnosis not present

## 2013-11-19 DIAGNOSIS — M6281 Muscle weakness (generalized): Secondary | ICD-10-CM | POA: Diagnosis not present

## 2013-11-19 DIAGNOSIS — M1990 Unspecified osteoarthritis, unspecified site: Secondary | ICD-10-CM | POA: Diagnosis not present

## 2013-11-19 DIAGNOSIS — I5032 Chronic diastolic (congestive) heart failure: Secondary | ICD-10-CM | POA: Diagnosis not present

## 2013-11-19 DIAGNOSIS — E119 Type 2 diabetes mellitus without complications: Secondary | ICD-10-CM | POA: Diagnosis not present

## 2013-11-25 ENCOUNTER — Ambulatory Visit (INDEPENDENT_AMBULATORY_CARE_PROVIDER_SITE_OTHER): Payer: Medicare Other | Admitting: *Deleted

## 2013-11-25 DIAGNOSIS — I4891 Unspecified atrial fibrillation: Secondary | ICD-10-CM | POA: Diagnosis not present

## 2013-11-25 DIAGNOSIS — Z5181 Encounter for therapeutic drug level monitoring: Secondary | ICD-10-CM | POA: Diagnosis not present

## 2013-11-25 DIAGNOSIS — Z96651 Presence of right artificial knee joint: Secondary | ICD-10-CM | POA: Diagnosis not present

## 2013-11-25 DIAGNOSIS — M25561 Pain in right knee: Secondary | ICD-10-CM | POA: Diagnosis not present

## 2013-11-25 DIAGNOSIS — M25661 Stiffness of right knee, not elsewhere classified: Secondary | ICD-10-CM | POA: Diagnosis not present

## 2013-11-25 DIAGNOSIS — R262 Difficulty in walking, not elsewhere classified: Secondary | ICD-10-CM | POA: Diagnosis not present

## 2013-11-25 LAB — POCT INR: INR: 2.4

## 2013-11-29 DIAGNOSIS — M25561 Pain in right knee: Secondary | ICD-10-CM | POA: Diagnosis not present

## 2013-11-29 DIAGNOSIS — Z96651 Presence of right artificial knee joint: Secondary | ICD-10-CM | POA: Diagnosis not present

## 2013-11-29 DIAGNOSIS — M25661 Stiffness of right knee, not elsewhere classified: Secondary | ICD-10-CM | POA: Diagnosis not present

## 2013-11-29 DIAGNOSIS — R262 Difficulty in walking, not elsewhere classified: Secondary | ICD-10-CM | POA: Diagnosis not present

## 2013-12-02 DIAGNOSIS — M25561 Pain in right knee: Secondary | ICD-10-CM | POA: Diagnosis not present

## 2013-12-02 DIAGNOSIS — Z96651 Presence of right artificial knee joint: Secondary | ICD-10-CM | POA: Diagnosis not present

## 2013-12-02 DIAGNOSIS — R262 Difficulty in walking, not elsewhere classified: Secondary | ICD-10-CM | POA: Diagnosis not present

## 2013-12-02 DIAGNOSIS — M25661 Stiffness of right knee, not elsewhere classified: Secondary | ICD-10-CM | POA: Diagnosis not present

## 2013-12-03 DIAGNOSIS — Z96651 Presence of right artificial knee joint: Secondary | ICD-10-CM | POA: Diagnosis not present

## 2013-12-06 DIAGNOSIS — M25561 Pain in right knee: Secondary | ICD-10-CM | POA: Diagnosis not present

## 2013-12-06 DIAGNOSIS — M25661 Stiffness of right knee, not elsewhere classified: Secondary | ICD-10-CM | POA: Diagnosis not present

## 2013-12-06 DIAGNOSIS — Z96651 Presence of right artificial knee joint: Secondary | ICD-10-CM | POA: Diagnosis not present

## 2013-12-06 DIAGNOSIS — R262 Difficulty in walking, not elsewhere classified: Secondary | ICD-10-CM | POA: Diagnosis not present

## 2013-12-08 ENCOUNTER — Ambulatory Visit (INDEPENDENT_AMBULATORY_CARE_PROVIDER_SITE_OTHER): Payer: Medicare Other | Admitting: *Deleted

## 2013-12-08 DIAGNOSIS — I4891 Unspecified atrial fibrillation: Secondary | ICD-10-CM | POA: Diagnosis not present

## 2013-12-08 DIAGNOSIS — M25661 Stiffness of right knee, not elsewhere classified: Secondary | ICD-10-CM | POA: Diagnosis not present

## 2013-12-08 DIAGNOSIS — R262 Difficulty in walking, not elsewhere classified: Secondary | ICD-10-CM | POA: Diagnosis not present

## 2013-12-08 DIAGNOSIS — M25561 Pain in right knee: Secondary | ICD-10-CM | POA: Diagnosis not present

## 2013-12-08 DIAGNOSIS — Z96651 Presence of right artificial knee joint: Secondary | ICD-10-CM | POA: Diagnosis not present

## 2013-12-08 DIAGNOSIS — Z5181 Encounter for therapeutic drug level monitoring: Secondary | ICD-10-CM | POA: Diagnosis not present

## 2013-12-08 LAB — POCT INR: INR: 2.2

## 2013-12-08 MED ORDER — WARFARIN SODIUM 5 MG PO TABS: 5.0000 mg | ORAL_TABLET | ORAL | Status: DC

## 2013-12-10 DIAGNOSIS — M25561 Pain in right knee: Secondary | ICD-10-CM | POA: Diagnosis not present

## 2013-12-10 DIAGNOSIS — R262 Difficulty in walking, not elsewhere classified: Secondary | ICD-10-CM | POA: Diagnosis not present

## 2013-12-10 DIAGNOSIS — Z96651 Presence of right artificial knee joint: Secondary | ICD-10-CM | POA: Diagnosis not present

## 2013-12-10 DIAGNOSIS — M25661 Stiffness of right knee, not elsewhere classified: Secondary | ICD-10-CM | POA: Diagnosis not present

## 2013-12-14 DIAGNOSIS — M25661 Stiffness of right knee, not elsewhere classified: Secondary | ICD-10-CM | POA: Diagnosis not present

## 2013-12-14 DIAGNOSIS — R262 Difficulty in walking, not elsewhere classified: Secondary | ICD-10-CM | POA: Diagnosis not present

## 2013-12-14 DIAGNOSIS — Z96651 Presence of right artificial knee joint: Secondary | ICD-10-CM | POA: Diagnosis not present

## 2013-12-14 DIAGNOSIS — M25561 Pain in right knee: Secondary | ICD-10-CM | POA: Diagnosis not present

## 2013-12-15 DIAGNOSIS — M25661 Stiffness of right knee, not elsewhere classified: Secondary | ICD-10-CM | POA: Diagnosis not present

## 2013-12-15 DIAGNOSIS — Z96651 Presence of right artificial knee joint: Secondary | ICD-10-CM | POA: Diagnosis not present

## 2013-12-15 DIAGNOSIS — R262 Difficulty in walking, not elsewhere classified: Secondary | ICD-10-CM | POA: Diagnosis not present

## 2013-12-15 DIAGNOSIS — M25561 Pain in right knee: Secondary | ICD-10-CM | POA: Diagnosis not present

## 2013-12-17 DIAGNOSIS — M25561 Pain in right knee: Secondary | ICD-10-CM | POA: Diagnosis not present

## 2013-12-17 DIAGNOSIS — Z96651 Presence of right artificial knee joint: Secondary | ICD-10-CM | POA: Diagnosis not present

## 2013-12-17 DIAGNOSIS — R262 Difficulty in walking, not elsewhere classified: Secondary | ICD-10-CM | POA: Diagnosis not present

## 2013-12-17 DIAGNOSIS — M25661 Stiffness of right knee, not elsewhere classified: Secondary | ICD-10-CM | POA: Diagnosis not present

## 2013-12-20 DIAGNOSIS — Z96651 Presence of right artificial knee joint: Secondary | ICD-10-CM | POA: Diagnosis not present

## 2013-12-20 DIAGNOSIS — M25661 Stiffness of right knee, not elsewhere classified: Secondary | ICD-10-CM | POA: Diagnosis not present

## 2013-12-20 DIAGNOSIS — R262 Difficulty in walking, not elsewhere classified: Secondary | ICD-10-CM | POA: Diagnosis not present

## 2013-12-20 DIAGNOSIS — M25561 Pain in right knee: Secondary | ICD-10-CM | POA: Diagnosis not present

## 2013-12-22 DIAGNOSIS — R262 Difficulty in walking, not elsewhere classified: Secondary | ICD-10-CM | POA: Diagnosis not present

## 2013-12-22 DIAGNOSIS — M25561 Pain in right knee: Secondary | ICD-10-CM | POA: Diagnosis not present

## 2013-12-22 DIAGNOSIS — M25661 Stiffness of right knee, not elsewhere classified: Secondary | ICD-10-CM | POA: Diagnosis not present

## 2013-12-22 DIAGNOSIS — Z96651 Presence of right artificial knee joint: Secondary | ICD-10-CM | POA: Diagnosis not present

## 2013-12-27 DIAGNOSIS — Z96651 Presence of right artificial knee joint: Secondary | ICD-10-CM | POA: Diagnosis not present

## 2013-12-27 DIAGNOSIS — M25661 Stiffness of right knee, not elsewhere classified: Secondary | ICD-10-CM | POA: Diagnosis not present

## 2013-12-27 DIAGNOSIS — R262 Difficulty in walking, not elsewhere classified: Secondary | ICD-10-CM | POA: Diagnosis not present

## 2013-12-27 DIAGNOSIS — M25561 Pain in right knee: Secondary | ICD-10-CM | POA: Diagnosis not present

## 2013-12-29 DIAGNOSIS — Z96651 Presence of right artificial knee joint: Secondary | ICD-10-CM | POA: Diagnosis not present

## 2013-12-29 DIAGNOSIS — M25561 Pain in right knee: Secondary | ICD-10-CM | POA: Diagnosis not present

## 2013-12-29 DIAGNOSIS — R262 Difficulty in walking, not elsewhere classified: Secondary | ICD-10-CM | POA: Diagnosis not present

## 2013-12-29 DIAGNOSIS — M25661 Stiffness of right knee, not elsewhere classified: Secondary | ICD-10-CM | POA: Diagnosis not present

## 2013-12-30 DIAGNOSIS — M25561 Pain in right knee: Secondary | ICD-10-CM | POA: Diagnosis not present

## 2013-12-30 DIAGNOSIS — Z96651 Presence of right artificial knee joint: Secondary | ICD-10-CM | POA: Diagnosis not present

## 2013-12-30 DIAGNOSIS — M25661 Stiffness of right knee, not elsewhere classified: Secondary | ICD-10-CM | POA: Diagnosis not present

## 2013-12-30 DIAGNOSIS — R262 Difficulty in walking, not elsewhere classified: Secondary | ICD-10-CM | POA: Diagnosis not present

## 2014-01-03 DIAGNOSIS — R262 Difficulty in walking, not elsewhere classified: Secondary | ICD-10-CM | POA: Diagnosis not present

## 2014-01-03 DIAGNOSIS — M25561 Pain in right knee: Secondary | ICD-10-CM | POA: Diagnosis not present

## 2014-01-03 DIAGNOSIS — M25661 Stiffness of right knee, not elsewhere classified: Secondary | ICD-10-CM | POA: Diagnosis not present

## 2014-01-03 DIAGNOSIS — Z96651 Presence of right artificial knee joint: Secondary | ICD-10-CM | POA: Diagnosis not present

## 2014-01-05 ENCOUNTER — Ambulatory Visit (INDEPENDENT_AMBULATORY_CARE_PROVIDER_SITE_OTHER): Payer: Medicare Other | Admitting: *Deleted

## 2014-01-05 DIAGNOSIS — I4891 Unspecified atrial fibrillation: Secondary | ICD-10-CM | POA: Diagnosis not present

## 2014-01-05 DIAGNOSIS — R262 Difficulty in walking, not elsewhere classified: Secondary | ICD-10-CM | POA: Diagnosis not present

## 2014-01-05 DIAGNOSIS — M25561 Pain in right knee: Secondary | ICD-10-CM | POA: Diagnosis not present

## 2014-01-05 DIAGNOSIS — Z5181 Encounter for therapeutic drug level monitoring: Secondary | ICD-10-CM | POA: Diagnosis not present

## 2014-01-05 DIAGNOSIS — M25661 Stiffness of right knee, not elsewhere classified: Secondary | ICD-10-CM | POA: Diagnosis not present

## 2014-01-05 DIAGNOSIS — Z96651 Presence of right artificial knee joint: Secondary | ICD-10-CM | POA: Diagnosis not present

## 2014-01-05 LAB — POCT INR: INR: 2

## 2014-01-07 DIAGNOSIS — Z96651 Presence of right artificial knee joint: Secondary | ICD-10-CM | POA: Diagnosis not present

## 2014-01-07 DIAGNOSIS — M25661 Stiffness of right knee, not elsewhere classified: Secondary | ICD-10-CM | POA: Diagnosis not present

## 2014-01-07 DIAGNOSIS — R262 Difficulty in walking, not elsewhere classified: Secondary | ICD-10-CM | POA: Diagnosis not present

## 2014-01-07 DIAGNOSIS — M25561 Pain in right knee: Secondary | ICD-10-CM | POA: Diagnosis not present

## 2014-01-10 DIAGNOSIS — Z96651 Presence of right artificial knee joint: Secondary | ICD-10-CM | POA: Diagnosis not present

## 2014-01-10 DIAGNOSIS — R262 Difficulty in walking, not elsewhere classified: Secondary | ICD-10-CM | POA: Diagnosis not present

## 2014-01-10 DIAGNOSIS — M25561 Pain in right knee: Secondary | ICD-10-CM | POA: Diagnosis not present

## 2014-01-10 DIAGNOSIS — M25661 Stiffness of right knee, not elsewhere classified: Secondary | ICD-10-CM | POA: Diagnosis not present

## 2014-01-10 DIAGNOSIS — Z09 Encounter for follow-up examination after completed treatment for conditions other than malignant neoplasm: Secondary | ICD-10-CM | POA: Diagnosis not present

## 2014-01-12 DIAGNOSIS — Z96651 Presence of right artificial knee joint: Secondary | ICD-10-CM | POA: Diagnosis not present

## 2014-01-12 DIAGNOSIS — R262 Difficulty in walking, not elsewhere classified: Secondary | ICD-10-CM | POA: Diagnosis not present

## 2014-01-12 DIAGNOSIS — M25661 Stiffness of right knee, not elsewhere classified: Secondary | ICD-10-CM | POA: Diagnosis not present

## 2014-01-12 DIAGNOSIS — M25561 Pain in right knee: Secondary | ICD-10-CM | POA: Diagnosis not present

## 2014-01-14 DIAGNOSIS — M25661 Stiffness of right knee, not elsewhere classified: Secondary | ICD-10-CM | POA: Diagnosis not present

## 2014-01-14 DIAGNOSIS — M25561 Pain in right knee: Secondary | ICD-10-CM | POA: Diagnosis not present

## 2014-01-14 DIAGNOSIS — R262 Difficulty in walking, not elsewhere classified: Secondary | ICD-10-CM | POA: Diagnosis not present

## 2014-01-14 DIAGNOSIS — Z96651 Presence of right artificial knee joint: Secondary | ICD-10-CM | POA: Diagnosis not present

## 2014-01-19 DIAGNOSIS — M25561 Pain in right knee: Secondary | ICD-10-CM | POA: Diagnosis not present

## 2014-01-19 DIAGNOSIS — R262 Difficulty in walking, not elsewhere classified: Secondary | ICD-10-CM | POA: Diagnosis not present

## 2014-01-19 DIAGNOSIS — M25661 Stiffness of right knee, not elsewhere classified: Secondary | ICD-10-CM | POA: Diagnosis not present

## 2014-01-19 DIAGNOSIS — Z96651 Presence of right artificial knee joint: Secondary | ICD-10-CM | POA: Diagnosis not present

## 2014-01-22 DIAGNOSIS — M25511 Pain in right shoulder: Secondary | ICD-10-CM | POA: Diagnosis not present

## 2014-01-25 DIAGNOSIS — M25561 Pain in right knee: Secondary | ICD-10-CM | POA: Diagnosis not present

## 2014-01-25 DIAGNOSIS — R262 Difficulty in walking, not elsewhere classified: Secondary | ICD-10-CM | POA: Diagnosis not present

## 2014-01-25 DIAGNOSIS — Z96651 Presence of right artificial knee joint: Secondary | ICD-10-CM | POA: Diagnosis not present

## 2014-01-25 DIAGNOSIS — M25661 Stiffness of right knee, not elsewhere classified: Secondary | ICD-10-CM | POA: Diagnosis not present

## 2014-01-31 DIAGNOSIS — M25561 Pain in right knee: Secondary | ICD-10-CM | POA: Diagnosis not present

## 2014-01-31 DIAGNOSIS — M75101 Unspecified rotator cuff tear or rupture of right shoulder, not specified as traumatic: Secondary | ICD-10-CM | POA: Diagnosis not present

## 2014-01-31 DIAGNOSIS — Z96651 Presence of right artificial knee joint: Secondary | ICD-10-CM | POA: Diagnosis not present

## 2014-01-31 DIAGNOSIS — R262 Difficulty in walking, not elsewhere classified: Secondary | ICD-10-CM | POA: Diagnosis not present

## 2014-01-31 DIAGNOSIS — M25661 Stiffness of right knee, not elsewhere classified: Secondary | ICD-10-CM | POA: Diagnosis not present

## 2014-02-02 DIAGNOSIS — M25561 Pain in right knee: Secondary | ICD-10-CM | POA: Diagnosis not present

## 2014-02-02 DIAGNOSIS — R262 Difficulty in walking, not elsewhere classified: Secondary | ICD-10-CM | POA: Diagnosis not present

## 2014-02-02 DIAGNOSIS — M25661 Stiffness of right knee, not elsewhere classified: Secondary | ICD-10-CM | POA: Diagnosis not present

## 2014-02-02 DIAGNOSIS — Z96651 Presence of right artificial knee joint: Secondary | ICD-10-CM | POA: Diagnosis not present

## 2014-02-03 ENCOUNTER — Ambulatory Visit (INDEPENDENT_AMBULATORY_CARE_PROVIDER_SITE_OTHER): Payer: Medicare Other | Admitting: *Deleted

## 2014-02-03 DIAGNOSIS — Z5181 Encounter for therapeutic drug level monitoring: Secondary | ICD-10-CM

## 2014-02-03 DIAGNOSIS — I4891 Unspecified atrial fibrillation: Secondary | ICD-10-CM

## 2014-02-03 LAB — POCT INR: INR: 2.1

## 2014-02-04 DIAGNOSIS — M25561 Pain in right knee: Secondary | ICD-10-CM | POA: Diagnosis not present

## 2014-02-04 DIAGNOSIS — R262 Difficulty in walking, not elsewhere classified: Secondary | ICD-10-CM | POA: Diagnosis not present

## 2014-02-04 DIAGNOSIS — Z96651 Presence of right artificial knee joint: Secondary | ICD-10-CM | POA: Diagnosis not present

## 2014-02-04 DIAGNOSIS — M25661 Stiffness of right knee, not elsewhere classified: Secondary | ICD-10-CM | POA: Diagnosis not present

## 2014-02-07 DIAGNOSIS — Z96651 Presence of right artificial knee joint: Secondary | ICD-10-CM | POA: Diagnosis not present

## 2014-02-07 DIAGNOSIS — M25661 Stiffness of right knee, not elsewhere classified: Secondary | ICD-10-CM | POA: Diagnosis not present

## 2014-02-07 DIAGNOSIS — M25561 Pain in right knee: Secondary | ICD-10-CM | POA: Diagnosis not present

## 2014-02-07 DIAGNOSIS — R262 Difficulty in walking, not elsewhere classified: Secondary | ICD-10-CM | POA: Diagnosis not present

## 2014-02-08 ENCOUNTER — Encounter: Payer: Self-pay | Admitting: Internal Medicine

## 2014-02-08 ENCOUNTER — Ambulatory Visit (INDEPENDENT_AMBULATORY_CARE_PROVIDER_SITE_OTHER): Payer: Medicare Other | Admitting: Internal Medicine

## 2014-02-08 VITALS — BP 134/76 | HR 84 | Ht 62.0 in | Wt 194.0 lb

## 2014-02-08 DIAGNOSIS — I5032 Chronic diastolic (congestive) heart failure: Secondary | ICD-10-CM | POA: Diagnosis not present

## 2014-02-08 DIAGNOSIS — I10 Essential (primary) hypertension: Secondary | ICD-10-CM | POA: Diagnosis not present

## 2014-02-08 DIAGNOSIS — I480 Paroxysmal atrial fibrillation: Secondary | ICD-10-CM | POA: Diagnosis not present

## 2014-02-08 DIAGNOSIS — G459 Transient cerebral ischemic attack, unspecified: Secondary | ICD-10-CM | POA: Diagnosis not present

## 2014-02-08 DIAGNOSIS — Z950 Presence of cardiac pacemaker: Secondary | ICD-10-CM

## 2014-02-08 DIAGNOSIS — Z8673 Personal history of transient ischemic attack (TIA), and cerebral infarction without residual deficits: Secondary | ICD-10-CM | POA: Insufficient documentation

## 2014-02-08 LAB — MDC_IDC_ENUM_SESS_TYPE_INCLINIC
Brady Statistic AP VP Percent: 1 %
Brady Statistic AS VP Percent: 0 %
Brady Statistic AS VS Percent: 4 %
Date Time Interrogation Session: 20160112112217
Lead Channel Impedance Value: 489 Ohm
Lead Channel Pacing Threshold Amplitude: 0.5 V
Lead Channel Pacing Threshold Amplitude: 1.25 V
Lead Channel Pacing Threshold Pulse Width: 0.4 ms
Lead Channel Pacing Threshold Pulse Width: 0.4 ms
Lead Channel Sensing Intrinsic Amplitude: 1 mV
Lead Channel Setting Pacing Amplitude: 2.5 V
Lead Channel Setting Pacing Pulse Width: 0.4 ms
Lead Channel Setting Sensing Sensitivity: 4 mV
MDC IDC MSMT BATTERY IMPEDANCE: 988 Ohm
MDC IDC MSMT BATTERY REMAINING LONGEVITY: 43 mo
MDC IDC MSMT BATTERY VOLTAGE: 2.76 V
MDC IDC MSMT LEADCHNL RV IMPEDANCE VALUE: 666 Ohm
MDC IDC MSMT LEADCHNL RV SENSING INTR AMPL: 8 mV
MDC IDC SET LEADCHNL RA PACING AMPLITUDE: 2 V
MDC IDC STAT BRADY AP VS PERCENT: 95 %

## 2014-02-08 NOTE — Assessment & Plan Note (Signed)
Her symptoms are class 2. She will continue her current meds. 

## 2014-02-08 NOTE — Assessment & Plan Note (Signed)
Her symptoms have resolved. She had gone almost 3 weeks since her last episode of atrial fib before she had the TIAs. I suspect her INR was not quite high enough. We discussed switching from warfarin to a NOAC. She would like to speak to her primary cardiologist. I asked the patient to avoid green leafy vegetables in the interim.

## 2014-02-08 NOTE — Patient Instructions (Signed)
Your physician wants you to follow-up in: 12 months with Dr. Taylor You will receive a reminder letter in the mail two months in advance. If you don't receive a letter, please call our office to schedule the follow-up appointment.  Remote monitoring is used to monitor your Pacemaker or ICD from home. This monitoring reduces the number of office visits required to check your device to one time per year. It allows us to keep an eye on the functioning of your device to ensure it is working properly. You are scheduled for a device check from home on 05/10/14. You may send your transmission at any time that day. If you have a wireless device, the transmission will be sent automatically. After your physician reviews your transmission, you will receive a postcard with your next transmission date.    

## 2014-02-08 NOTE — Assessment & Plan Note (Signed)
Her blood pressure is well controlled. No change in medical therapy.

## 2014-02-08 NOTE — Assessment & Plan Note (Signed)
She has mostly maintained NSR. She has had episodes lasting over 3 days in duration and her last episode was in early December. She will continue her current meds. No indication at this time for anti-arrhythmic drug therapy.

## 2014-02-08 NOTE — Assessment & Plan Note (Signed)
Her medtronic DDD PM is working normally. Will recheck in several months. 

## 2014-02-08 NOTE — Progress Notes (Signed)
HPI [name] returns today for ongoing evaluation and management of her Medtronic dual-chamber pacemaker. The patient has a history of sinus node dysfunction and is status post permanent pacemaker insertion, initially in 2001. She underwent generator change out in 2008. She has chronic class II diastolic heart failure, and obesity. She also has a history of gout. In addition she has a history of paroxysmal atrial fibrillation though she is maintaining sinus rhythm very nicely. She denies anginal symptoms. No peripheral edema. No syncope. She underwent knee replacement surgery 5 months ago. She has been slow to heal. She describes an episode of left arm and knee numbness which lasted 10 minutes. She did not seek medical attention.  Allergies  Allergen Reactions  . Lasix [Furosemide] Other (See Comments)    "Too strong" - tolerates Bumex  . Levsin [Hyoscyamine Sulfate]     unknown  . Procardia [Nifedipine] Other (See Comments)    GI uspet  . Prednisone Palpitations and Other (See Comments)    Tachycardia, fluttering  Can tolerate low doses     Current Outpatient Prescriptions  Medication Sig Dispense Refill  . Acetaminophen (TYLENOL ARTHRITIS PAIN PO) Take 1 tablet by mouth every 6 (six) hours as needed (arthritis).    Marland Kitchen acetaminophen (TYLENOL) 500 MG tablet Take 1,000 mg by mouth every 6 (six) hours as needed for pain.     Marland Kitchen amLODipine (NORVASC) 5 MG tablet Take 5 mg by mouth daily.    Marland Kitchen b complex vitamins tablet Take 1 tablet by mouth daily.    . bimatoprost (LUMIGAN) 0.01 % SOLN Place 1 drop into both eyes at bedtime.    . bumetanide (BUMEX) 0.5 MG tablet Take 1 tablet (0.5 mg total) by mouth daily. 90 tablet 3  . calcium carbonate (OS-CAL) 600 MG TABS Take 600 mg by mouth 3 (three) times a week.     . diazepam (VALIUM) 5 MG tablet Take 5 mg by mouth daily as needed for anxiety.     [name] 575 MG/5ML SYRP Take 575 mg by mouth daily as needed (cold symptoms).     .  Glycerin-Polysorbate 80 (REFRESH DRY EYE THERAPY OP) Apply 1 drop to eye daily as needed (dry eyes).    Marland Kitchen HYDROcodone-acetaminophen (NORCO/VICODIN) 5-325 MG per tablet Take 1-2 tablets by mouth every 4 (four) hours as needed (breakthrough pain). 50 tablet 0  . lidocaine (LIDODERM) 5 % Place 1 patch onto the skin daily as needed (pain).     . methocarbamol (ROBAXIN) 500 MG tablet Take 1 tablet (500 mg total) by mouth every 6 (six) hours as needed for muscle spasms. 50 tablet 0  . Multiple Vitamin (MULTIVITAMIN WITH MINERALS) TABS Take 1 tablet by mouth daily.    Marland Kitchen nystatin (MYCOSTATIN) 100000 UNIT/ML suspension Take 500,000 Units by mouth daily as needed (for mouth irritation).     Marland Kitchen OVER THE COUNTER MEDICATION Apply 1 application topically 2 (two) times daily. "Formula 3", OTC medication for toenail fungus.    . potassium chloride SA (K-DUR,KLOR-CON) 20 MEQ tablet Take 1 tablet (20 mEq total) by mouth daily. 90 tablet 3  . senna (SENOKOT) 8.6 MG tablet Take 2 tablets by mouth at bedtime.     . valsartan (DIOVAN) 320 MG tablet Take 1 tablet (320 mg total) by mouth daily. 30 tablet 6  . vitamin C (ASCORBIC ACID) 500 MG tablet Take 500 mg by mouth daily.    Marland Kitchen warfarin (COUMADIN) 5 MG tablet Take 1 tablet (  5 mg total) by mouth as directed. 120 tablet 0  . beta carotene 25000 UNIT capsule Take 25,000 Units by mouth 3 (three) times a week.      No current facility-administered medications for this visit.     Past Medical History  Diagnosis Date  . Depression   . Coronary atherosclerosis of native coronary artery   . Venous insufficiency   . Tachy-brady syndrome     With permanent pacemaker 04/03/99  . PAF (paroxysmal atrial fibrillation)     takes Coumadin daily  . Lichen planus   . Hypertension     takes Amlodipine and Diovan daily  . Chronic diastolic CHF (congestive heart failure)     takes Bumex daily  . Anxiety     takes Valium daily as needed  . Muscle spasm     takes Celebrex daily  and Robaxin daily as needed  . Heart murmur   . TIA (transient ischemic attack)   . Pacemaker   . Pneumonia     as a child  . Dizziness     occasionally  . Joint pain   . Joint swelling   . Chronic back pain     stenosis  . History of gout     doesn't take any meds  . History of gastric ulcer   . History of colon polyps   . Diverticulosis   . Glaucoma     uses eye drops daily  . Chronic bronchitis     "get it practically q yr"   . Tuberculosis     during childhood unsure of time  . Type II diabetes mellitus     but doesn't take any meds (10/19/2013)  . Stroke 1990's    "found it in ~ 1997-1998; never even knew I'd had one"  . Osteoarthritis   . Arthritis     "joints"  . GERD (gastroesophageal reflux disease)     ROS:   All systems reviewed and negative except as noted in the HPI.   Past Surgical History  Procedure Laterality Date  . Tonsillectomy    . Tubal ligation  1971  . Cataract extraction w/ intraocular lens  implant, bilateral Bilateral ?2005  . Back surgery    . Lumbar disc surgery  08/2006  . Cardiac catheterization  X 2  . Colonoscopy    . Esophagogastroduodenoscopy    . Total knee arthroplasty Right 10/19/2013  . Abdominal hysterectomy      w/BSO  . Insert / replace / remove pacemaker  04/03/99; ?2008  . Total knee arthroplasty Right 10/19/2013    Procedure: TOTAL KNEE ARTHROPLASTY;  Surgeon: Hessie Dibble, MD;  Location: [facility];  Service: Orthopedics;  Laterality: Right;     Family History  Problem Relation Age of Onset  . Malignant hyperthermia Neg Hx   . Hypertension Mother   . Cancer Mother     Maglignant Brain Tumor  . Heart disease Father   . Hypertension Father   . Heart failure Sister   . Diabetes Sister      History   Social History  . Marital Status: Widowed    Spouse Name: N/A    Number of Children: N/A  . Years of Education: N/A   Occupational History  . Not on file.   Social History Main Topics  . Smoking status:  Never Smoker   . Smokeless tobacco: Never Used  . Alcohol Use: No  . Drug Use: No  . Sexual Activity: No  Other Topics Concern  . Not on file   Social History Narrative     BP 134/76 mmHg  Pulse 84  Ht 5\' 2"  (1.575 m)  Wt 194 lb (87.998 kg)  BMI 35.47 kg/m2  Physical Exam:  Well appearing  Obese, 81 year old woman, NAD HEENT: Unremarkable Neck:  6 cm JVD, no thyromegally Back:  No CVA tenderness Lungs:  Clear with no wheezes, rales, or rhonchi. HEART:  Regular rate rhythm, no murmurs, no rubs, no clicks Abd:  soft, positive bowel sounds, no organomegally, no rebound, no guarding Ext:  2 plus pulses, no edema, no cyanosis, no clubbing Skin:  No rashes no nodules Neuro:  CN II through XII intact, motor grossly intact  DEVICE  Normal device function.  See PaceArt for details.   Assess/Plan:

## 2014-02-09 DIAGNOSIS — Z96651 Presence of right artificial knee joint: Secondary | ICD-10-CM | POA: Diagnosis not present

## 2014-02-09 DIAGNOSIS — R262 Difficulty in walking, not elsewhere classified: Secondary | ICD-10-CM | POA: Diagnosis not present

## 2014-02-09 DIAGNOSIS — M25661 Stiffness of right knee, not elsewhere classified: Secondary | ICD-10-CM | POA: Diagnosis not present

## 2014-02-09 DIAGNOSIS — M25561 Pain in right knee: Secondary | ICD-10-CM | POA: Diagnosis not present

## 2014-02-11 DIAGNOSIS — M25561 Pain in right knee: Secondary | ICD-10-CM | POA: Diagnosis not present

## 2014-02-11 DIAGNOSIS — M25661 Stiffness of right knee, not elsewhere classified: Secondary | ICD-10-CM | POA: Diagnosis not present

## 2014-02-11 DIAGNOSIS — Z96651 Presence of right artificial knee joint: Secondary | ICD-10-CM | POA: Diagnosis not present

## 2014-02-11 DIAGNOSIS — R262 Difficulty in walking, not elsewhere classified: Secondary | ICD-10-CM | POA: Diagnosis not present

## 2014-02-17 ENCOUNTER — Encounter: Payer: Self-pay | Admitting: Internal Medicine

## 2014-03-02 ENCOUNTER — Ambulatory Visit (INDEPENDENT_AMBULATORY_CARE_PROVIDER_SITE_OTHER): Payer: Medicare Other | Admitting: *Deleted

## 2014-03-02 DIAGNOSIS — Z5181 Encounter for therapeutic drug level monitoring: Secondary | ICD-10-CM | POA: Diagnosis not present

## 2014-03-02 DIAGNOSIS — I480 Paroxysmal atrial fibrillation: Secondary | ICD-10-CM | POA: Diagnosis not present

## 2014-03-02 DIAGNOSIS — I4891 Unspecified atrial fibrillation: Secondary | ICD-10-CM

## 2014-03-02 LAB — POCT INR: INR: 1.9

## 2014-03-10 DIAGNOSIS — N390 Urinary tract infection, site not specified: Secondary | ICD-10-CM | POA: Diagnosis not present

## 2014-03-10 DIAGNOSIS — M1990 Unspecified osteoarthritis, unspecified site: Secondary | ICD-10-CM | POA: Diagnosis not present

## 2014-03-10 DIAGNOSIS — I495 Sick sinus syndrome: Secondary | ICD-10-CM | POA: Diagnosis not present

## 2014-03-10 DIAGNOSIS — K589 Irritable bowel syndrome without diarrhea: Secondary | ICD-10-CM | POA: Diagnosis not present

## 2014-03-10 DIAGNOSIS — E780 Pure hypercholesterolemia: Secondary | ICD-10-CM | POA: Diagnosis not present

## 2014-03-10 DIAGNOSIS — I2510 Atherosclerotic heart disease of native coronary artery without angina pectoris: Secondary | ICD-10-CM | POA: Diagnosis not present

## 2014-03-10 DIAGNOSIS — K219 Gastro-esophageal reflux disease without esophagitis: Secondary | ICD-10-CM | POA: Diagnosis not present

## 2014-03-10 DIAGNOSIS — M109 Gout, unspecified: Secondary | ICD-10-CM | POA: Diagnosis not present

## 2014-03-10 DIAGNOSIS — I4891 Unspecified atrial fibrillation: Secondary | ICD-10-CM | POA: Diagnosis not present

## 2014-03-10 DIAGNOSIS — E119 Type 2 diabetes mellitus without complications: Secondary | ICD-10-CM | POA: Diagnosis not present

## 2014-03-10 DIAGNOSIS — I10 Essential (primary) hypertension: Secondary | ICD-10-CM | POA: Diagnosis not present

## 2014-03-10 DIAGNOSIS — I872 Venous insufficiency (chronic) (peripheral): Secondary | ICD-10-CM | POA: Diagnosis not present

## 2014-03-16 ENCOUNTER — Ambulatory Visit (INDEPENDENT_AMBULATORY_CARE_PROVIDER_SITE_OTHER): Payer: Medicare Other | Admitting: *Deleted

## 2014-03-16 DIAGNOSIS — Z5181 Encounter for therapeutic drug level monitoring: Secondary | ICD-10-CM

## 2014-03-16 DIAGNOSIS — I480 Paroxysmal atrial fibrillation: Secondary | ICD-10-CM

## 2014-03-16 DIAGNOSIS — I4891 Unspecified atrial fibrillation: Secondary | ICD-10-CM

## 2014-03-16 LAB — POCT INR: INR: 2.3

## 2014-03-16 MED ORDER — WARFARIN SODIUM 5 MG PO TABS: ORAL_TABLET | ORAL | Status: DC

## 2014-03-24 DIAGNOSIS — H4011X2 Primary open-angle glaucoma, moderate stage: Secondary | ICD-10-CM | POA: Diagnosis not present

## 2014-03-24 DIAGNOSIS — H26493 Other secondary cataract, bilateral: Secondary | ICD-10-CM | POA: Diagnosis not present

## 2014-03-25 DIAGNOSIS — Z09 Encounter for follow-up examination after completed treatment for conditions other than malignant neoplasm: Secondary | ICD-10-CM | POA: Diagnosis not present

## 2014-03-25 DIAGNOSIS — Z96651 Presence of right artificial knee joint: Secondary | ICD-10-CM | POA: Diagnosis not present

## 2014-03-25 DIAGNOSIS — M75101 Unspecified rotator cuff tear or rupture of right shoulder, not specified as traumatic: Secondary | ICD-10-CM | POA: Diagnosis not present

## 2014-04-01 DIAGNOSIS — M75101 Unspecified rotator cuff tear or rupture of right shoulder, not specified as traumatic: Secondary | ICD-10-CM | POA: Diagnosis not present

## 2014-04-01 DIAGNOSIS — M6281 Muscle weakness (generalized): Secondary | ICD-10-CM | POA: Diagnosis not present

## 2014-04-01 DIAGNOSIS — M25511 Pain in right shoulder: Secondary | ICD-10-CM | POA: Diagnosis not present

## 2014-04-05 ENCOUNTER — Ambulatory Visit: Payer: Medicare Other | Admitting: Interventional Cardiology

## 2014-04-06 ENCOUNTER — Ambulatory Visit (INDEPENDENT_AMBULATORY_CARE_PROVIDER_SITE_OTHER): Payer: Medicare Other | Admitting: Pharmacist

## 2014-04-06 DIAGNOSIS — M6281 Muscle weakness (generalized): Secondary | ICD-10-CM | POA: Diagnosis not present

## 2014-04-06 DIAGNOSIS — I480 Paroxysmal atrial fibrillation: Secondary | ICD-10-CM | POA: Diagnosis not present

## 2014-04-06 DIAGNOSIS — M25511 Pain in right shoulder: Secondary | ICD-10-CM | POA: Diagnosis not present

## 2014-04-06 DIAGNOSIS — Z5181 Encounter for therapeutic drug level monitoring: Secondary | ICD-10-CM | POA: Diagnosis not present

## 2014-04-06 DIAGNOSIS — I4891 Unspecified atrial fibrillation: Secondary | ICD-10-CM | POA: Diagnosis not present

## 2014-04-06 DIAGNOSIS — M75101 Unspecified rotator cuff tear or rupture of right shoulder, not specified as traumatic: Secondary | ICD-10-CM | POA: Diagnosis not present

## 2014-04-06 LAB — POCT INR: INR: 1.5

## 2014-04-07 DIAGNOSIS — M6281 Muscle weakness (generalized): Secondary | ICD-10-CM | POA: Diagnosis not present

## 2014-04-07 DIAGNOSIS — M25511 Pain in right shoulder: Secondary | ICD-10-CM | POA: Diagnosis not present

## 2014-04-07 DIAGNOSIS — M75101 Unspecified rotator cuff tear or rupture of right shoulder, not specified as traumatic: Secondary | ICD-10-CM | POA: Diagnosis not present

## 2014-04-13 DIAGNOSIS — M75101 Unspecified rotator cuff tear or rupture of right shoulder, not specified as traumatic: Secondary | ICD-10-CM | POA: Diagnosis not present

## 2014-04-13 DIAGNOSIS — M25511 Pain in right shoulder: Secondary | ICD-10-CM | POA: Diagnosis not present

## 2014-04-13 DIAGNOSIS — M6281 Muscle weakness (generalized): Secondary | ICD-10-CM | POA: Diagnosis not present

## 2014-04-18 DIAGNOSIS — M25511 Pain in right shoulder: Secondary | ICD-10-CM | POA: Diagnosis not present

## 2014-04-18 DIAGNOSIS — M6281 Muscle weakness (generalized): Secondary | ICD-10-CM | POA: Diagnosis not present

## 2014-04-18 DIAGNOSIS — M75101 Unspecified rotator cuff tear or rupture of right shoulder, not specified as traumatic: Secondary | ICD-10-CM | POA: Diagnosis not present

## 2014-04-20 DIAGNOSIS — M25511 Pain in right shoulder: Secondary | ICD-10-CM | POA: Diagnosis not present

## 2014-04-20 DIAGNOSIS — M75101 Unspecified rotator cuff tear or rupture of right shoulder, not specified as traumatic: Secondary | ICD-10-CM | POA: Diagnosis not present

## 2014-04-20 DIAGNOSIS — M6281 Muscle weakness (generalized): Secondary | ICD-10-CM | POA: Diagnosis not present

## 2014-04-27 ENCOUNTER — Ambulatory Visit (INDEPENDENT_AMBULATORY_CARE_PROVIDER_SITE_OTHER): Payer: Medicare Other | Admitting: *Deleted

## 2014-04-27 DIAGNOSIS — I480 Paroxysmal atrial fibrillation: Secondary | ICD-10-CM

## 2014-04-27 DIAGNOSIS — I4891 Unspecified atrial fibrillation: Secondary | ICD-10-CM | POA: Diagnosis not present

## 2014-04-27 DIAGNOSIS — Z5181 Encounter for therapeutic drug level monitoring: Secondary | ICD-10-CM

## 2014-04-27 LAB — POCT INR: INR: 2.2

## 2014-05-04 ENCOUNTER — Ambulatory Visit (INDEPENDENT_AMBULATORY_CARE_PROVIDER_SITE_OTHER): Payer: Medicare Other | Admitting: Interventional Cardiology

## 2014-05-04 ENCOUNTER — Encounter: Payer: Self-pay | Admitting: Interventional Cardiology

## 2014-05-04 VITALS — BP 136/82 | HR 123 | Ht 62.0 in | Wt 193.0 lb

## 2014-05-04 DIAGNOSIS — Z5181 Encounter for therapeutic drug level monitoring: Secondary | ICD-10-CM | POA: Diagnosis not present

## 2014-05-04 DIAGNOSIS — I5032 Chronic diastolic (congestive) heart failure: Secondary | ICD-10-CM

## 2014-05-04 DIAGNOSIS — M75101 Unspecified rotator cuff tear or rupture of right shoulder, not specified as traumatic: Secondary | ICD-10-CM | POA: Diagnosis not present

## 2014-05-04 DIAGNOSIS — I10 Essential (primary) hypertension: Secondary | ICD-10-CM

## 2014-05-04 DIAGNOSIS — G458 Other transient cerebral ischemic attacks and related syndromes: Secondary | ICD-10-CM

## 2014-05-04 DIAGNOSIS — I480 Paroxysmal atrial fibrillation: Secondary | ICD-10-CM | POA: Diagnosis not present

## 2014-05-04 DIAGNOSIS — Z7901 Long term (current) use of anticoagulants: Secondary | ICD-10-CM

## 2014-05-04 MED ORDER — DILTIAZEM HCL ER COATED BEADS 240 MG PO TB24: 240.0000 mg | ORAL_TABLET | Freq: Every day | ORAL | Status: DC

## 2014-05-04 NOTE — Progress Notes (Signed)
Cardiology Office Note   Date:  05/04/2014   ID:  SHAHED Marsh, DOB 10-24-1932, MRN [id number]  PCP:  Shirline Frees, MD  Cardiologist:   Sinclair Grooms, MD   Chief Complaint  Patient presents with  . Follow-up    today having some chest pressure      History of Present Illness: [name] is a 81 y.o. female who presents for follow-up of paroxysmal atrial fibrillation, diastolic heart failure, bradycardia syndrome. Patient is been previously on Betapace which failed to suppress atrial fibrillation. She was then placed on amiodarone but this medication has been discontinued October 2014 as the patient did not feel well on the medication. She denies chest discomfort. She has felt somewhat fatigued and dragging now for more than a week. There is some dyspnea with activity. Not had lower extremity swelling. There is mild orthopnea.    Past Medical History  Diagnosis Date  . Depression   . Coronary atherosclerosis of native coronary artery   . Venous insufficiency   . Tachy-brady syndrome     With permanent pacemaker 04/03/99  . PAF (paroxysmal atrial fibrillation)     takes Coumadin daily  . Lichen planus   . Hypertension     takes Amlodipine and Diovan daily  . Chronic diastolic CHF (congestive heart failure)     takes Bumex daily  . Anxiety     takes Valium daily as needed  . Muscle spasm     takes Celebrex daily and Robaxin daily as needed  . Heart murmur   . TIA (transient ischemic attack)   . Pacemaker   . Pneumonia     as a child  . Dizziness     occasionally  . Joint pain   . Joint swelling   . Chronic back pain     stenosis  . History of gout     doesn't take any meds  . History of gastric ulcer   . History of colon polyps   . Diverticulosis   . Glaucoma     uses eye drops daily  . Chronic bronchitis     "get it practically q yr"   . Tuberculosis     during childhood unsure of time  . Type II diabetes mellitus     but doesn't take  any meds (10/19/2013)  . Stroke 1990's    "found it in ~ 1997-1998; never even knew I'd had one"  . Osteoarthritis   . Arthritis     "joints"  . GERD (gastroesophageal reflux disease)     Past Surgical History  Procedure Laterality Date  . Tonsillectomy    . Tubal ligation  1971  . Cataract extraction w/ intraocular lens  implant, bilateral Bilateral ?2005  . Back surgery    . Lumbar disc surgery  08/2006  . Cardiac catheterization  X 2  . Colonoscopy    . Esophagogastroduodenoscopy    . Total knee arthroplasty Right 10/19/2013  . Abdominal hysterectomy      w/BSO  . Insert / replace / remove pacemaker  04/03/99; ?2008  . Total knee arthroplasty Right 10/19/2013    Procedure: TOTAL KNEE ARTHROPLASTY;  Surgeon: Hessie Dibble, MD;  Location: [facility];  Service: Orthopedics;  Laterality: Right;     Current Outpatient Prescriptions  Medication Sig Dispense Refill  . Acetaminophen (TYLENOL ARTHRITIS PAIN PO) Take 1 tablet by mouth every 6 (six) hours as needed (arthritis).    Marland Kitchen acetaminophen (TYLENOL) 500 MG  tablet Take 1,000 mg by mouth every 6 (six) hours as needed for pain.     Marland Kitchen b complex vitamins tablet Take 1 tablet by mouth daily.    . beta carotene 25000 UNIT capsule Take 25,000 Units by mouth 3 (three) times a week.     . bimatoprost (LUMIGAN) 0.01 % SOLN Place 1 drop into both eyes at bedtime.    . bumetanide (BUMEX) 0.5 MG tablet Take 1 tablet (0.5 mg total) by mouth daily. 90 tablet 3  . calcium carbonate (OS-CAL) 600 MG TABS Take 600 mg by mouth 3 (three) times a week.     . diazepam (VALIUM) 5 MG tablet Take 5 mg by mouth daily as needed for anxiety.     Marland Kitchen diltiazem (CARDIZEM LA) 240 MG 24 hr tablet Take 1 tablet (240 mg total) by mouth daily. 30 tablet 0  . Elderberry 575 MG/5ML SYRP Take 575 mg by mouth daily as needed (cold symptoms).     . Glycerin-Polysorbate 80 (REFRESH DRY EYE THERAPY OP) Apply 1 drop to eye daily as needed (dry eyes).    Marland Kitchen HYDROcodone-acetaminophen  (NORCO/VICODIN) 5-325 MG per tablet Take 1-2 tablets by mouth every 4 (four) hours as needed (breakthrough pain). 50 tablet 0  . lidocaine (LIDODERM) 5 % Place 1 patch onto the skin daily as needed (pain).     . methocarbamol (ROBAXIN) 500 MG tablet Take 1 tablet (500 mg total) by mouth every 6 (six) hours as needed for muscle spasms. 50 tablet 0  . Multiple Vitamin (MULTIVITAMIN WITH MINERALS) TABS Take 1 tablet by mouth daily.    Marland Kitchen nystatin (MYCOSTATIN) 100000 UNIT/ML suspension Take 500,000 Units by mouth daily as needed (for mouth irritation).     Marland Kitchen OVER THE COUNTER MEDICATION Apply 1 application topically 2 (two) times daily. "Formula 3", OTC medication for toenail fungus.    . potassium chloride SA (K-DUR,KLOR-CON) 20 MEQ tablet Take 1 tablet (20 mEq total) by mouth daily. 90 tablet 3  . senna (SENOKOT) 8.6 MG tablet Take 2 tablets by mouth at bedtime.     . valsartan (DIOVAN) 320 MG tablet Take 1 tablet (320 mg total) by mouth daily. 30 tablet 6  . vitamin C (ASCORBIC ACID) 500 MG tablet Take 500 mg by mouth daily.    Marland Kitchen warfarin (COUMADIN) 5 MG tablet Take as directed by coumadin clinic 120 tablet 0   No current facility-administered medications for this visit.    Allergies:   Lasix; Levsin; Procardia; and Prednisone    Social History:  The patient  reports that she has never smoked. She has never used smokeless tobacco. She reports that she does not drink alcohol or use illicit drugs.   Family History:  The patient's family history includes Cancer in her mother; Diabetes in her sister; Heart disease in her father; Heart failure in her sister; Hypertension in her father and mother. There is no history of Malignant hyperthermia.    ROS:  Please see the history of present illness.   Otherwise, review of systems are positive for weakness and fatigue. Her appetite is been stable..   All other systems are reviewed and negative.    PHYSICAL EXAM: VS:  BP 136/82 mmHg  Pulse 123  Ht 5\' 2"   (1.575 m)  Wt 193 lb (87.544 kg)  BMI 35.29 kg/m2 , BMI Body mass index is 35.29 kg/(m^2). GEN: Well nourished, well developed, in no acute distress HEENT: normal Neck: no JVD, carotid bruits, or masses Cardiac:  Irregularly irregular with increased ventricular response; no murmurs, rubs, or gallops,no edema  Respiratory:  clear to auscultation bilaterally, normal work of breathing GI: soft, nontender, nondistended, + BS MS: no deformity or atrophy Skin: warm and dry, no rash Neuro:  Strength and sensation are intact Psych: euthymic mood, full affect   EKG:  EKG is ordered today. The ekg ordered today demonstrates atrial fibrillation with rapid ventricular response at 120 bpm and nonspecific ST-T wave abnormality.   Recent Labs: 10/20/2013: BUN 11; Creatinine 0.93; Potassium 4.1; Sodium 138 10/21/2013: Hemoglobin 10.6*; Platelets 203    Lipid Panel No results found for: CHOL, TRIG, HDL, CHOLHDL, VLDL, LDLCALC, LDLDIRECT    Wt Readings from Last 3 Encounters:  05/04/14 193 lb (87.544 kg)  02/08/14 194 lb (87.998 kg)  10/27/13 201 lb 12.8 oz (91.536 kg)      Other studies Reviewed: Additional studies/ records that were reviewed today include: I reviewed prior dated 2 try to understand why Betapace was discontinued. This happened quite some time ago.. Review of the above records demonstrates:    ASSESSMENT AND PLAN:  Chronic diastolic heart failure: Exacerbated by prolonged atrial fibrillation  Long term current use of anticoagulant therapy  Continuous a-fib: Unknown duration, aggravating diastolic heart failure. The patient previously been on both Betapace, followed by amiodarone. Amiodarone was stopped 18 months ago because of medication side effects. She did not have thyroid liver or lung complications however.  Other specified transient cerebral ischemias  Essential hypertension: Controlled     Current medicines are reviewed at length with the patient today.  The  patient does not have concerns regarding medicines.  The following changes have been made:  We will discontinue amlodipine and start diltiazem CD 240 mg per day to control rate. She will return to the office in 2 days for EKG and clinical follow-up. If she feels worse in the interim, she may need to be admitted to the hospital.  Labs/ tests ordered today include:   Orders Placed This Encounter  Procedures  . EKG 12-Lead     Disposition:   FU with . Miciah Covelli in 2 days   Signed, Sinclair Grooms, MD  05/04/2014 5:29 PM    [facility] [address], [place], [place]  [address] Phone: [phone]; Fax: [phone]

## 2014-05-04 NOTE — Patient Instructions (Addendum)
Your physician has recommended you make the following change in your medication:  STOP AMLODIPINE START CARDIZEM ( 240 MG ) DAILY, START TONIGHT New medication sent into [facility]  Your physician recommends that you Keep your scheduled  follow-up appointment on Friday, April 8 @ 12:00.

## 2014-05-06 ENCOUNTER — Encounter: Payer: Self-pay | Admitting: Interventional Cardiology

## 2014-05-06 ENCOUNTER — Ambulatory Visit (INDEPENDENT_AMBULATORY_CARE_PROVIDER_SITE_OTHER): Payer: Medicare Other | Admitting: Interventional Cardiology

## 2014-05-06 VITALS — BP 106/62 | HR 90 | Ht 62.0 in | Wt 201.4 lb

## 2014-05-06 DIAGNOSIS — G459 Transient cerebral ischemic attack, unspecified: Secondary | ICD-10-CM

## 2014-05-06 DIAGNOSIS — Z7901 Long term (current) use of anticoagulants: Secondary | ICD-10-CM

## 2014-05-06 DIAGNOSIS — I10 Essential (primary) hypertension: Secondary | ICD-10-CM | POA: Diagnosis not present

## 2014-05-06 DIAGNOSIS — I480 Paroxysmal atrial fibrillation: Secondary | ICD-10-CM | POA: Diagnosis not present

## 2014-05-06 DIAGNOSIS — I5032 Chronic diastolic (congestive) heart failure: Secondary | ICD-10-CM | POA: Diagnosis not present

## 2014-05-06 NOTE — Patient Instructions (Signed)
Your physician recommends that you continue on your current medications as directed. Please refer to the Current Medication list given to you today.  Your physician recommends that you schedule a follow-up appointment with the device clinic in 2 weeks (Device interrogation)  Your physician recommends that you schedule a follow-up appointment in: 4-6 weeks with Dr.Smith

## 2014-05-06 NOTE — Progress Notes (Signed)
Cardiology Office Note   Date:  05/06/2014   ID:  [name], DOB [birth year], MRN [id number]  PCP:  Shirline Frees, MD  Cardiologist:   Sinclair Grooms, MD   No chief complaint on file.     History of Present Illness: [name] is a 81 y.o. female who presents for follow-up of recurrent atrial fibrillation with acute diastolic heart failure. When seen 48 hours ago she had a heart rate in the 120s, was somewhat diaphoretic, and had been feeling breathless and weak for several days to perhaps 2 weeks. We discontinued amlodipine and started diltiazem CD 240 mg per day. She feels much better today. She started feeling better approximately 24 hours after starting diltiazem.  She has a history of tachybradycardia syndrome with paroxysmal atrial fibrillation. She had been previously treated with both Betapace and amiodarone. Betapace was discontinued because of failure to suppress recurrent A. fib. Amiodarone did a great job of suppressing atrial fib but the patient felt poorly on the medication and eventually asked to be taken off of it.    Past Medical History  Diagnosis Date  . Depression   . Coronary atherosclerosis of native coronary artery   . Venous insufficiency   . Tachy-brady syndrome     With permanent pacemaker 04/03/99  . PAF (paroxysmal atrial fibrillation)     takes Coumadin daily  . Lichen planus   . Hypertension     takes Amlodipine and Diovan daily  . Chronic diastolic CHF (congestive heart failure)     takes Bumex daily  . Anxiety     takes Valium daily as needed  . Muscle spasm     takes Celebrex daily and Robaxin daily as needed  . Heart murmur   . TIA (transient ischemic attack)   . Pacemaker   . Pneumonia     as a child  . Dizziness     occasionally  . Joint pain   . Joint swelling   . Chronic back pain     stenosis  . History of gout     doesn't take any meds  . History of gastric ulcer   . History of colon polyps   .  Diverticulosis   . Glaucoma     uses eye drops daily  . Chronic bronchitis     "get it practically q yr"   . Tuberculosis     during childhood unsure of time  . Type II diabetes mellitus     but doesn't take any meds (10/19/2013)  . Stroke 1990's    "found it in ~ 1997-1998; never even knew I'd had one"  . Osteoarthritis   . Arthritis     "joints"  . GERD (gastroesophageal reflux disease)     Past Surgical History  Procedure Laterality Date  . Tonsillectomy    . Tubal ligation  1971  . Cataract extraction w/ intraocular lens  implant, bilateral Bilateral ?2005  . Back surgery    . Lumbar disc surgery  08/2006  . Cardiac catheterization  X 2  . Colonoscopy    . Esophagogastroduodenoscopy    . Total knee arthroplasty Right 10/19/2013  . Abdominal hysterectomy      w/BSO  . Insert / replace / remove pacemaker  04/03/99; ?2008  . Total knee arthroplasty Right 10/19/2013    Procedure: TOTAL KNEE ARTHROPLASTY;  Surgeon: Hessie Dibble, MD;  Location: [facility];  Service: Orthopedics;  Laterality: Right;     Current  Outpatient Prescriptions  Medication Sig Dispense Refill  . Acetaminophen (TYLENOL ARTHRITIS PAIN PO) Take 1 tablet by mouth every 6 (six) hours as needed (arthritis).    Marland Kitchen acetaminophen (TYLENOL) 500 MG tablet Take 1,000 mg by mouth every 6 (six) hours as needed for pain.     Marland Kitchen b complex vitamins tablet Take 1 tablet by mouth daily.    . beta carotene 25000 UNIT capsule Take 25,000 Units by mouth 3 (three) times a week.     . bimatoprost (LUMIGAN) 0.01 % SOLN Place 1 drop into both eyes at bedtime.    . bumetanide (BUMEX) 0.5 MG tablet Take 1 tablet (0.5 mg total) by mouth daily. 90 tablet 3  . calcium carbonate (OS-CAL) 600 MG TABS Take 600 mg by mouth 3 (three) times a week.     . diazepam (VALIUM) 5 MG tablet Take 5 mg by mouth daily as needed for anxiety.     Marland Kitchen diltiazem (CARDIZEM LA) 240 MG 24 hr tablet Take 1 tablet (240 mg total) by mouth daily. 30 tablet 0  .  Elderberry 575 MG/5ML SYRP Take 575 mg by mouth daily as needed (cold symptoms).     . Glycerin-Polysorbate 80 (REFRESH DRY EYE THERAPY OP) Apply 1 drop to eye daily as needed (dry eyes).    Marland Kitchen HYDROcodone-acetaminophen (NORCO/VICODIN) 5-325 MG per tablet Take 1-2 tablets by mouth every 4 (four) hours as needed (breakthrough pain). 50 tablet 0  . lidocaine (LIDODERM) 5 % Place 1 patch onto the skin daily as needed (pain).     . methocarbamol (ROBAXIN) 500 MG tablet Take 1 tablet (500 mg total) by mouth every 6 (six) hours as needed for muscle spasms. 50 tablet 0  . Multiple Vitamin (MULTIVITAMIN WITH MINERALS) TABS Take 1 tablet by mouth daily.    Marland Kitchen nystatin (MYCOSTATIN) 100000 UNIT/ML suspension Take 500,000 Units by mouth daily as needed (for mouth irritation).     Marland Kitchen OVER THE COUNTER MEDICATION Apply 1 application topically 2 (two) times daily. "Formula 3", OTC medication for toenail fungus.    . potassium chloride SA (K-DUR,KLOR-CON) 20 MEQ tablet Take 1 tablet (20 mEq total) by mouth daily. 90 tablet 3  . senna (SENOKOT) 8.6 MG tablet Take 2 tablets by mouth at bedtime.     . valsartan (DIOVAN) 320 MG tablet Take 1 tablet (320 mg total) by mouth daily. 30 tablet 6  . vitamin C (ASCORBIC ACID) 500 MG tablet Take 500 mg by mouth daily.    Marland Kitchen warfarin (COUMADIN) 5 MG tablet Take as directed by coumadin clinic 120 tablet 0   No current facility-administered medications for this visit.    Allergies:   Lasix; Levsin; Procardia; and Prednisone    Social History:  The patient  reports that she has never smoked. She has never used smokeless tobacco. She reports that she does not drink alcohol or use illicit drugs.   Family History:  The patient's family history includes Cancer in her mother; Diabetes in her sister; Heart disease in her father; Heart failure in her sister; Hypertension in her father and mother. There is no history of Malignant hyperthermia.    ROS:  Please see the history of present  illness.   Otherwise, review of systems are positive for lower extremity swelling, depression, back discomfort, muscle pain, dizziness, constipation, anxiety, and joint swelling..   All other systems are reviewed and negative.    PHYSICAL EXAM: VS:  BP 106/62 mmHg  Pulse 90  Ht 5'  2" (1.575 m)  Wt 201 lb 6.4 oz (91.354 kg)  BMI 36.83 kg/m2 , BMI Body mass index is 36.83 kg/(m^2). GEN: Well nourished, well developed, in no acute distress HEENT: normal Neck: no JVD, carotid bruits, or masses Cardiac: RRR; no murmurs, rubs, or gallops,no edema  Respiratory:  clear to auscultation bilaterally, normal work of breathing GI: soft, nontender, nondistended, + BS MS: no deformity or atrophy Skin: warm and dry, no rash Neuro:  Strength and sensation are intact Psych: euthymic mood, full affect   EKG:  EKG is ordered today. The ekg ordered today demonstrates atrial pacing at 90 bpm diffuse mild T-wave inversion is noted. Compared to the prior tracing atrial fibrillation has resolved.   Recent Labs: 10/20/2013: BUN 11; Creatinine 0.93; Potassium 4.1; Sodium 138 10/21/2013: Hemoglobin 10.6*; Platelets 203    Lipid Panel No results found for: CHOL, TRIG, HDL, CHOLHDL, VLDL, LDLCALC, LDLDIRECT    Wt Readings from Last 3 Encounters:  05/06/14 201 lb 6.4 oz (91.354 kg)  05/04/14 193 lb (87.544 kg)  02/08/14 194 lb (87.998 kg)      Other studies Reviewed: Additional studies/ records that were reviewed today include: . Review of the above records demonstrates: After reviewing the records I did identify that amiodarone was stopped at the patient's request.   ASSESSMENT AND PLAN:  Paroxysmal a-fib - the patient has reverted to normal sinus rhythm  Essential hypertension: Controlled  Long term current use of anticoagulant therapy: No bleeding  Chronic diastolic heart failure: Dyspnea is markedly improved  Transient cerebral ischemia, unspecified transient cerebral ischemia  type     Current medicines are reviewed at length with the patient today.  The patient does not have concerns regarding medicines.  The following changes have been made:  We will have her pacemaker interrogated to determine how much atrial fibrillation/mode switching she has had. This will inform me concerning whether additional  Labs/ tests ordered today include:  Orders Placed This Encounter  Procedures  . EKG 12-Lead     Disposition:   FU with Linard Millers in 6 weeks   Signed, Sinclair Grooms, MD  05/06/2014 12:52 PM    [facility] [address], [place], [place]  [address] Phone: [phone]; Fax: [phone]

## 2014-05-26 ENCOUNTER — Ambulatory Visit (INDEPENDENT_AMBULATORY_CARE_PROVIDER_SITE_OTHER): Payer: Medicare Other

## 2014-05-26 ENCOUNTER — Ambulatory Visit (INDEPENDENT_AMBULATORY_CARE_PROVIDER_SITE_OTHER): Payer: Medicare Other | Admitting: *Deleted

## 2014-05-26 DIAGNOSIS — Z950 Presence of cardiac pacemaker: Secondary | ICD-10-CM | POA: Diagnosis not present

## 2014-05-26 DIAGNOSIS — I480 Paroxysmal atrial fibrillation: Secondary | ICD-10-CM

## 2014-05-26 DIAGNOSIS — Z5181 Encounter for therapeutic drug level monitoring: Secondary | ICD-10-CM | POA: Diagnosis not present

## 2014-05-26 DIAGNOSIS — I4891 Unspecified atrial fibrillation: Secondary | ICD-10-CM | POA: Diagnosis not present

## 2014-05-26 DIAGNOSIS — I639 Cerebral infarction, unspecified: Secondary | ICD-10-CM

## 2014-05-26 LAB — MDC_IDC_ENUM_SESS_TYPE_INCLINIC
Battery Impedance: 1096 Ohm
Battery Remaining Longevity: 40 mo
Battery Voltage: 2.75 V
Brady Statistic AP VP Percent: 0 %
Brady Statistic AP VS Percent: 95 %
Brady Statistic AS VP Percent: 0 %
Lead Channel Impedance Value: 480 Ohm
Lead Channel Pacing Threshold Pulse Width: 0.4 ms
Lead Channel Sensing Intrinsic Amplitude: 1.4 mV
Lead Channel Setting Pacing Amplitude: 2 V
Lead Channel Setting Pacing Amplitude: 3 V
Lead Channel Setting Pacing Pulse Width: 0.4 ms
Lead Channel Setting Sensing Sensitivity: 4 mV
MDC IDC MSMT LEADCHNL RV IMPEDANCE VALUE: 575 Ohm
MDC IDC MSMT LEADCHNL RV PACING THRESHOLD AMPLITUDE: 1.5 V
MDC IDC MSMT LEADCHNL RV SENSING INTR AMPL: 8 mV
MDC IDC SESS DTM: 20160428145112
MDC IDC STAT BRADY AS VS PERCENT: 4 %

## 2014-05-26 LAB — POCT INR: INR: 2.6

## 2014-05-26 NOTE — Progress Notes (Signed)
Pacemaker check in clinic. Normal device function. Thresholds, sensing, impedances consistent with previous measurements. Device programmed to maximize longevity. 6.5% mode switch + coumadin. 1 high ventricular rate---was 1:1 SVT. Device programmed at appropriate safety margins. Histogram distribution appropriate for patient activity level. Device programmed to optimize intrinsic conduction. Estimated longevity 34yrs. ROV w/ GT 1/17.

## 2014-06-03 ENCOUNTER — Other Ambulatory Visit: Payer: Self-pay | Admitting: *Deleted

## 2014-06-03 DIAGNOSIS — I480 Paroxysmal atrial fibrillation: Secondary | ICD-10-CM

## 2014-06-03 MED ORDER — DILTIAZEM HCL ER COATED BEADS 240 MG PO TB24: 240.0000 mg | ORAL_TABLET | Freq: Every day | ORAL | Status: DC

## 2014-06-06 ENCOUNTER — Ambulatory Visit (INDEPENDENT_AMBULATORY_CARE_PROVIDER_SITE_OTHER): Payer: Medicare Other | Admitting: Interventional Cardiology

## 2014-06-06 ENCOUNTER — Encounter: Payer: Self-pay | Admitting: Interventional Cardiology

## 2014-06-06 VITALS — BP 140/80 | HR 85 | Ht 62.0 in | Wt 206.8 lb

## 2014-06-06 DIAGNOSIS — I10 Essential (primary) hypertension: Secondary | ICD-10-CM | POA: Diagnosis not present

## 2014-06-06 DIAGNOSIS — I480 Paroxysmal atrial fibrillation: Secondary | ICD-10-CM

## 2014-06-06 DIAGNOSIS — I5032 Chronic diastolic (congestive) heart failure: Secondary | ICD-10-CM

## 2014-06-06 DIAGNOSIS — Z7901 Long term (current) use of anticoagulants: Secondary | ICD-10-CM

## 2014-06-06 DIAGNOSIS — I639 Cerebral infarction, unspecified: Secondary | ICD-10-CM

## 2014-06-06 MED ORDER — METOPROLOL SUCCINATE ER 25 MG PO TB24: 25.0000 mg | ORAL_TABLET | Freq: Every day | ORAL | Status: DC

## 2014-06-06 MED ORDER — BUMETANIDE 1 MG PO TABS: 1.0000 mg | ORAL_TABLET | Freq: Every day | ORAL | Status: DC

## 2014-06-06 NOTE — Patient Instructions (Signed)
Medication Instructions:  INCREASE Bumex to 1mg  daily. An Rx has been sent to your pharmacy. START Metoprolol Succinate 25mg  daily. An Rx has been sent to your pharmacy  Labwork: Your physician recommends that you return for lab work on 06/14/14 (Bmet)  Testing/Procedures: None    Follow-Up: You have a follow up appointment scheduled on 07/08/14 @ 9:45am  Any Other Special Instructions Will Be Listed Below (If Applicable).

## 2014-06-06 NOTE — Progress Notes (Signed)
Cardiology Office Note   Date:  06/06/2014   ID:  [name], DOB 1932-04-08, MRN [id number]  PCP:  Shirline Frees, MD  Cardiologist:   Sinclair Grooms, MD   Chief Complaint  Patient presents with  . Chronic Diastolic Heart Failure  . Paroxysmal AFIB      History of Present Illness: [name] is a 81 y.o. female who presents for diastolic heart failure, paroxysmal atrial fibrillation associated with tachycardia bradycardia syndrome, permanent pacemaker, hypertension, chronic anticoagulation therapy, and intolerance to amiodarone therapy.  The patient is returning today after being found to have paroxysmal atrial fibrillation after discontinuation of amiodarone. Amiodarone was DC'd because of recurring episodes of dyspnea and weakness.    Past Medical History  Diagnosis Date  . Depression   . Coronary atherosclerosis of native coronary artery   . Venous insufficiency   . Tachy-brady syndrome     With permanent pacemaker 04/03/99  . PAF (paroxysmal atrial fibrillation)     takes Coumadin daily  . Lichen planus   . Hypertension     takes Amlodipine and Diovan daily  . Chronic diastolic CHF (congestive heart failure)     takes Bumex daily  . Anxiety     takes Valium daily as needed  . Muscle spasm     takes Celebrex daily and Robaxin daily as needed  . Heart murmur   . TIA (transient ischemic attack)   . Pacemaker   . Pneumonia     as a child  . Dizziness     occasionally  . Joint pain   . Joint swelling   . Chronic back pain     stenosis  . History of gout     doesn't take any meds  . History of gastric ulcer   . History of colon polyps   . Diverticulosis   . Glaucoma     uses eye drops daily  . Chronic bronchitis     "get it practically q yr"   . Tuberculosis     during childhood unsure of time  . Type II diabetes mellitus     but doesn't take any meds (10/19/2013)  . Stroke 1990's    "found it in ~ 1997-1998; never even knew I'd had  one"  . Osteoarthritis   . Arthritis     "joints"  . GERD (gastroesophageal reflux disease)     Past Surgical History  Procedure Laterality Date  . Tonsillectomy    . Tubal ligation  1971  . Cataract extraction w/ intraocular lens  implant, bilateral Bilateral ?2005  . Back surgery    . Lumbar disc surgery  08/2006  . Cardiac catheterization  X 2  . Colonoscopy    . Esophagogastroduodenoscopy    . Total knee arthroplasty Right 10/19/2013  . Abdominal hysterectomy      w/BSO  . Insert / replace / remove pacemaker  04/03/99; ?2008  . Total knee arthroplasty Right 10/19/2013    Procedure: TOTAL KNEE ARTHROPLASTY;  Surgeon: Hessie Dibble, MD;  Location: [facility];  Service: Orthopedics;  Laterality: Right;     Current Outpatient Prescriptions  Medication Sig Dispense Refill  . Acetaminophen (TYLENOL ARTHRITIS PAIN PO) Take 1 tablet by mouth every 6 (six) hours as needed (arthritis).    Marland Kitchen acetaminophen (TYLENOL) 500 MG tablet Take 1,000 mg by mouth every 6 (six) hours as needed for pain.     Marland Kitchen b complex vitamins tablet Take 1 tablet by mouth  daily.    . beta carotene 25000 UNIT capsule Take 25,000 Units by mouth 3 (three) times a week.     . bimatoprost (LUMIGAN) 0.01 % SOLN Place 1 drop into both eyes at bedtime.    . bumetanide (BUMEX) 1 MG tablet Take 1 tablet (1 mg total) by mouth daily. 30 tablet 11  . calcium carbonate (OS-CAL) 600 MG TABS Take 600 mg by mouth 3 (three) times a week.     . diazepam (VALIUM) 5 MG tablet Take 5 mg by mouth daily as needed for anxiety.     Marland Kitchen diltiazem (CARDIZEM LA) 240 MG 24 hr tablet Take 1 tablet (240 mg total) by mouth daily. 30 tablet 0  . Elderberry 575 MG/5ML SYRP Take 575 mg by mouth daily as needed (cold symptoms).     . Glycerin-Polysorbate 80 (REFRESH DRY EYE THERAPY OP) Apply 1 drop to eye daily as needed (dry eyes).    Marland Kitchen HYDROcodone-acetaminophen (NORCO/VICODIN) 5-325 MG per tablet Take 1-2 tablets by mouth every 4 (four) hours as needed  (breakthrough pain). 50 tablet 0  . lidocaine (LIDODERM) 5 % Place 1 patch onto the skin daily as needed (pain).     . methocarbamol (ROBAXIN) 500 MG tablet Take 1 tablet (500 mg total) by mouth every 6 (six) hours as needed for muscle spasms. 50 tablet 0  . Multiple Vitamin (MULTIVITAMIN WITH MINERALS) TABS Take 1 tablet by mouth daily.    Marland Kitchen nystatin (MYCOSTATIN) 100000 UNIT/ML suspension Take 500,000 Units by mouth daily as needed (for mouth irritation).     Marland Kitchen OVER THE COUNTER MEDICATION Apply 1 application topically 2 (two) times daily. "Formula 3", OTC medication for toenail fungus.    . potassium chloride SA (K-DUR,KLOR-CON) 20 MEQ tablet Take 1 tablet (20 mEq total) by mouth daily. 90 tablet 3  . senna (SENOKOT) 8.6 MG tablet Take 2 tablets by mouth at bedtime.     . valsartan (DIOVAN) 320 MG tablet Take 1 tablet (320 mg total) by mouth daily. 30 tablet 6  . vitamin C (ASCORBIC ACID) 500 MG tablet Take 500 mg by mouth daily.    Marland Kitchen warfarin (COUMADIN) 5 MG tablet Take as directed by coumadin clinic 120 tablet 0  . metoprolol succinate (TOPROL XL) 25 MG 24 hr tablet Take 1 tablet (25 mg total) by mouth daily. 30 tablet 11   No current facility-administered medications for this visit.    Allergies:   Procardia; Lasix; Levsin; and Prednisone    Social History:  The patient  reports that she has never smoked. She has never used smokeless tobacco. She reports that she does not drink alcohol or use illicit drugs.   Family History:  The patient's family history includes Cancer in her mother; Diabetes in her sister; Heart disease in her father; Heart failure in her sister; Hypertension in her father and mother. There is no history of Malignant hyperthermia.    ROS:  Please see the history of present illness.   Otherwise, review of systems are positive for fatigue, lower extremity swelling, and difficulty with ambulation or her major complaints.   All other systems are reviewed and negative.     PHYSICAL EXAM: VS:  BP 140/80 mmHg  Pulse 85  Ht 5\' 2"  (1.575 m)  Wt 206 lb 12.8 oz (93.804 kg)  BMI 37.81 kg/m2  SpO2 97% , BMI Body mass index is 37.81 kg/(m^2). GEN: Well nourished, well developed, in no acute distress HEENT: normal Neck: no JVD, carotid  bruits, or masses Cardiac: RRR; no murmurs, rubs, or gallops. 1-2 + LE edema   Respiratory:  clear to auscultation bilaterally, normal work of breathing GI: soft, nontender, nondistended, + BS MS: no deformity or atrophy Skin: warm and dry, no rash Neuro:  Strength and sensation are intact Psych: euthymic mood, full affect   EKG:  EKG is not ordered today.    Recent Labs: 10/20/2013: BUN 11; Creatinine 0.93; Potassium 4.1; Sodium 138 10/21/2013: Hemoglobin 10.6*; Platelets 203    Lipid Panel No results found for: CHOL, TRIG, HDL, CHOLHDL, VLDL, LDLCALC, LDLDIRECT    Wt Readings from Last 3 Encounters:  06/06/14 206 lb 12.8 oz (93.804 kg)  05/06/14 201 lb 6.4 oz (91.354 kg)  05/04/14 193 lb (87.544 kg)      Other studies Reviewed: Additional studies/ records that were reviewed today include: .    ASSESSMENT AND PLAN:  Chronic diastolic heart failure - Plan: Basic metabolic panel  Paroxysmal a-fib - Plan: Basic metabolic panel  Essential hypertension - Plan: Basic metabolic panel  Long term current use of anticoagulant therapy     Current medicines are reviewed at length with the patient today.  The patient does not have concerns regarding medicines.  The following changes have been made:  Increase Bumex to 1 mg daily. Check basic metabolic panel in 7 days. Add metoprolol succinate 25 mg per day.  Labs/ tests ordered today include:   Orders Placed This Encounter  Procedures  . Basic metabolic panel     Disposition:   FU with HS in 4 weeks  Signed, Sinclair Grooms, MD  06/06/2014 1:05 PM    [facility] [place], [place], [place]  [address] Phone: [phone];  Fax: [phone]

## 2014-06-08 DIAGNOSIS — J209 Acute bronchitis, unspecified: Secondary | ICD-10-CM | POA: Diagnosis not present

## 2014-06-14 ENCOUNTER — Other Ambulatory Visit (INDEPENDENT_AMBULATORY_CARE_PROVIDER_SITE_OTHER): Payer: Medicare Other

## 2014-06-14 DIAGNOSIS — I5032 Chronic diastolic (congestive) heart failure: Secondary | ICD-10-CM

## 2014-06-14 DIAGNOSIS — I10 Essential (primary) hypertension: Secondary | ICD-10-CM | POA: Diagnosis not present

## 2014-06-14 DIAGNOSIS — I480 Paroxysmal atrial fibrillation: Secondary | ICD-10-CM

## 2014-06-14 LAB — BASIC METABOLIC PANEL
BUN: 9 mg/dL (ref 6–23)
CO2: 32 meq/L (ref 19–32)
CREATININE: 0.93 mg/dL (ref 0.40–1.20)
Calcium: 10.3 mg/dL (ref 8.4–10.5)
Chloride: 100 mEq/L (ref 96–112)
GFR: 74.72 mL/min (ref >60.00)
GLUCOSE: 127 mg/dL — AB (ref 70–99)
Potassium: 4.3 mEq/L (ref 3.5–5.1)
Sodium: 137 mEq/L (ref 135–145)

## 2014-06-17 ENCOUNTER — Telehealth: Payer: Self-pay | Admitting: Interventional Cardiology

## 2014-06-17 DIAGNOSIS — R05 Cough: Secondary | ICD-10-CM | POA: Diagnosis not present

## 2014-06-17 NOTE — Telephone Encounter (Signed)
Follow Up      Pt returning Danielle's call for lab results.

## 2014-06-22 ENCOUNTER — Ambulatory Visit (INDEPENDENT_AMBULATORY_CARE_PROVIDER_SITE_OTHER): Payer: Medicare Other

## 2014-06-22 DIAGNOSIS — I639 Cerebral infarction, unspecified: Secondary | ICD-10-CM

## 2014-06-22 DIAGNOSIS — Z5181 Encounter for therapeutic drug level monitoring: Secondary | ICD-10-CM | POA: Diagnosis not present

## 2014-06-22 DIAGNOSIS — I480 Paroxysmal atrial fibrillation: Secondary | ICD-10-CM

## 2014-06-22 DIAGNOSIS — G459 Transient cerebral ischemic attack, unspecified: Secondary | ICD-10-CM | POA: Diagnosis not present

## 2014-06-22 DIAGNOSIS — I4891 Unspecified atrial fibrillation: Secondary | ICD-10-CM | POA: Diagnosis not present

## 2014-06-22 LAB — POCT INR: INR: 2.5

## 2014-06-23 ENCOUNTER — Encounter: Payer: Self-pay | Admitting: Internal Medicine

## 2014-07-01 ENCOUNTER — Other Ambulatory Visit: Payer: Self-pay

## 2014-07-01 ENCOUNTER — Other Ambulatory Visit: Payer: Self-pay | Admitting: *Deleted

## 2014-07-01 DIAGNOSIS — I480 Paroxysmal atrial fibrillation: Secondary | ICD-10-CM

## 2014-07-01 MED ORDER — WARFARIN SODIUM 5 MG PO TABS: ORAL_TABLET | ORAL | Status: DC

## 2014-07-01 MED ORDER — DILTIAZEM HCL ER COATED BEADS 240 MG PO TB24: 240.0000 mg | ORAL_TABLET | Freq: Every day | ORAL | Status: DC

## 2014-07-08 ENCOUNTER — Encounter: Payer: Self-pay | Admitting: Interventional Cardiology

## 2014-07-08 ENCOUNTER — Ambulatory Visit (INDEPENDENT_AMBULATORY_CARE_PROVIDER_SITE_OTHER): Payer: Medicare Other | Admitting: Interventional Cardiology

## 2014-07-08 VITALS — BP 142/90 | HR 73 | Ht 62.0 in | Wt 201.4 lb

## 2014-07-08 DIAGNOSIS — G459 Transient cerebral ischemic attack, unspecified: Secondary | ICD-10-CM

## 2014-07-08 DIAGNOSIS — I480 Paroxysmal atrial fibrillation: Secondary | ICD-10-CM | POA: Diagnosis not present

## 2014-07-08 DIAGNOSIS — I639 Cerebral infarction, unspecified: Secondary | ICD-10-CM

## 2014-07-08 DIAGNOSIS — Z7901 Long term (current) use of anticoagulants: Secondary | ICD-10-CM

## 2014-07-08 DIAGNOSIS — I5032 Chronic diastolic (congestive) heart failure: Secondary | ICD-10-CM

## 2014-07-08 DIAGNOSIS — I10 Essential (primary) hypertension: Secondary | ICD-10-CM

## 2014-07-08 DIAGNOSIS — R0789 Other chest pain: Secondary | ICD-10-CM

## 2014-07-08 MED ORDER — BUMETANIDE 1 MG PO TABS: 1.5000 mg | ORAL_TABLET | Freq: Every day | ORAL | Status: DC

## 2014-07-08 NOTE — Progress Notes (Signed)
Cardiology Office Note   Date:  07/08/2014   ID:  [name], DOB Jun 22, 1932, MRN [id number]  PCP:  Shirline Frees, MD  Cardiologist:  Sinclair Grooms, MD   Chief Complaint  Patient presents with  . Congestive Heart Failure      History of Present Illness: [name] is a 81 y.o. female who presents for diastolic heart failure, left ventricular hypertrophy, hypertension, normal coronary arteries 2005, paroxysmal atrial fibrillation, permanent pacemaker, and chronic anticoagulation therapy.  The patient states that breathing has improved since increasing intensity of diarrhetic therapy. She is unable to tolerate amiodarone therapy for rhythm control. She is on chronic anticoagulation therapy. She does have tightness in the chest once she exerts herself. She denies orthopnea. She has not had palpitations or syncope.  She has many questions about her prognosis. We discussed diastolic heart failure. She is concerned she may have coronary disease. Coronary angina on 2005 revealed normal coronaries.    Past Medical History  Diagnosis Date  . Depression   . Coronary atherosclerosis of native coronary artery   . Venous insufficiency   . Tachy-brady syndrome     With permanent pacemaker 04/03/99  . PAF (paroxysmal atrial fibrillation)     takes Coumadin daily  . Lichen planus   . Hypertension     takes Amlodipine and Diovan daily  . Chronic diastolic CHF (congestive heart failure)     takes Bumex daily  . Anxiety     takes Valium daily as needed  . Muscle spasm     takes Celebrex daily and Robaxin daily as needed  . Heart murmur   . TIA (transient ischemic attack)   . Pacemaker   . Pneumonia     as a child  . Dizziness     occasionally  . Joint pain   . Joint swelling   . Chronic back pain     stenosis  . History of gout     doesn't take any meds  . History of gastric ulcer   . History of colon polyps   . Diverticulosis   . Glaucoma     uses eye  drops daily  . Chronic bronchitis     "get it practically q yr"   . Tuberculosis     during childhood unsure of time  . Type II diabetes mellitus     but doesn't take any meds (10/19/2013)  . Stroke 1990's    "found it in ~ 1997-1998; never even knew I'd had one"  . Osteoarthritis   . Arthritis     "joints"  . GERD (gastroesophageal reflux disease)     Past Surgical History  Procedure Laterality Date  . Tonsillectomy    . Tubal ligation  1971  . Cataract extraction w/ intraocular lens  implant, bilateral Bilateral ?2005  . Back surgery    . Lumbar disc surgery  08/2006  . Cardiac catheterization  X 2  . Colonoscopy    . Esophagogastroduodenoscopy    . Total knee arthroplasty Right 10/19/2013  . Abdominal hysterectomy      w/BSO  . Insert / replace / remove pacemaker  04/03/99; ?2008  . Total knee arthroplasty Right 10/19/2013    Procedure: TOTAL KNEE ARTHROPLASTY;  Surgeon: Hessie Dibble, MD;  Location: [facility];  Service: Orthopedics;  Laterality: Right;     Current Outpatient Prescriptions  Medication Sig Dispense Refill  . Acetaminophen (TYLENOL ARTHRITIS PAIN PO) Take 1 tablet by mouth every  6 (six) hours as needed (arthritis).    Marland Kitchen acetaminophen (TYLENOL) 500 MG tablet Take 1,000 mg by mouth every 6 (six) hours as needed for pain.     Marland Kitchen b complex vitamins tablet Take 1 tablet by mouth daily.    . beta carotene 25000 UNIT capsule Take 25,000 Units by mouth 3 (three) times a week.     . bimatoprost (LUMIGAN) 0.01 % SOLN Place 1 drop into both eyes at bedtime.    . bumetanide (BUMEX) 1 MG tablet Take 1 tablet (1 mg total) by mouth daily. 30 tablet 11  . calcium carbonate (OS-CAL) 600 MG TABS Take 600 mg by mouth 3 (three) times a week.     . diazepam (VALIUM) 5 MG tablet Take 5 mg by mouth daily as needed for anxiety.     Marland Kitchen diltiazem (CARDIZEM LA) 240 MG 24 hr tablet Take 1 tablet (240 mg total) by mouth daily. 30 tablet 11  . Elderberry 575 MG/5ML SYRP Take 575 mg by mouth  daily as needed (cold symptoms).     . Glycerin-Polysorbate 80 (REFRESH DRY EYE THERAPY OP) Apply 1 drop to eye daily as needed (dry eyes).    Marland Kitchen HYDROcodone-acetaminophen (NORCO/VICODIN) 5-325 MG per tablet Take 1-2 tablets by mouth every 4 (four) hours as needed (breakthrough pain). 50 tablet 0  . lidocaine (LIDODERM) 5 % Place 1 patch onto the skin daily as needed (pain).     . methocarbamol (ROBAXIN) 500 MG tablet Take 1 tablet (500 mg total) by mouth every 6 (six) hours as needed for muscle spasms. 50 tablet 0  . metoprolol succinate (TOPROL XL) 25 MG 24 hr tablet Take 1 tablet (25 mg total) by mouth daily. 30 tablet 11  . Multiple Vitamin (MULTIVITAMIN WITH MINERALS) TABS Take 1 tablet by mouth daily.    Marland Kitchen nystatin (MYCOSTATIN) 100000 UNIT/ML suspension Take 500,000 Units by mouth daily as needed (for mouth irritation).     Marland Kitchen OVER THE COUNTER MEDICATION Apply 1 application topically 2 (two) times daily. "Formula 3", OTC medication for toenail fungus.    . potassium chloride SA (K-DUR,KLOR-CON) 20 MEQ tablet Take 1 tablet (20 mEq total) by mouth daily. 90 tablet 3  . senna (SENOKOT) 8.6 MG tablet Take 2 tablets by mouth at bedtime.     . valsartan (DIOVAN) 320 MG tablet Take 1 tablet (320 mg total) by mouth daily. 30 tablet 6  . vitamin C (ASCORBIC ACID) 500 MG tablet Take 500 mg by mouth daily.    Marland Kitchen warfarin (COUMADIN) 5 MG tablet Take as directed by coumadin clinic 120 tablet 0   No current facility-administered medications for this visit.    Allergies:   Procardia; Lasix; Levsin; and Prednisone    Social History:  The patient  reports that she has never smoked. She has never used smokeless tobacco. She reports that she does not drink alcohol or use illicit drugs.   Family History:  The patient's family history includes Cancer in her mother; Diabetes in her sister; Heart disease in her father; Heart failure in her sister; Hypertension in her father and mother. There is no history of  Malignant hyperthermia.    ROS:  Please see the history of present illness.   Otherwise, review of systems are positive for occasional irregular heart beat, leg discomfort, cough, vision disturbance, and headache..   All other systems are reviewed and negative.    PHYSICAL EXAM: VS:  BP 142/90 mmHg  Pulse 73  Ht 5'  2" (1.575 m)  Wt 91.354 kg (201 lb 6.4 oz)  BMI 36.83 kg/m2  SpO2 98% , BMI Body mass index is 36.83 kg/(m^2). GEN: Well nourished, well developed, in no acute distress HEENT: normal Neck: no JVD, carotid bruits, or masses Cardiac: RRR; no murmurs, rubs, or gallops,no edema  Respiratory:  clear to auscultation bilaterally, normal work of breathing GI: soft, nontender, nondistended, + BS MS: no deformity or atrophy Skin: warm and dry, no rash Neuro:  Strength and sensation are intact Psych: euthymic mood, full affect   EKG:  EKG is not ordered today.    Recent Labs: 10/21/2013: Hemoglobin 10.6*; Platelets 203 06/14/2014: BUN 9; Creatinine, Ser 0.93; Potassium 4.3; Sodium 137    Lipid Panel No results found for: CHOL, TRIG, HDL, CHOLHDL, VLDL, LDLCALC, LDLDIRECT    Wt Readings from Last 3 Encounters:  07/08/14 91.354 kg (201 lb 6.4 oz)  06/06/14 93.804 kg (206 lb 12.8 oz)  05/06/14 91.354 kg (201 lb 6.4 oz)      Other studies Reviewed: Additional studies/ records that were reviewed today include: .    ASSESSMENT AND PLAN:  Paroxysmal a-fib-currently with regular rhythm  Chronic diastolic heart failure-currently with improved fine M status but still moderately symptomatic  Long term current use of anticoagulant therapy  Transient cerebral ischemia, unspecified transient cerebral ischemia type  Essential hypertension- elevated today but she has not yet had medications     Current medicines are reviewed at length with the patient today.  The patient has concerns regarding medicines.  The following changes have been made:  We will increase Bumex  to 1.5 mg per day. Basic metabolic panel in 2 weeks. Office follow-up in 2 months. A myocardial perfusion study will be done to exclude the possibility of CAD/ischemia. Daily weights. If increase more than 3 pounds in one day of 5 pounds in one week call for instruction.  Labs/ tests ordered today include:  No orders of the defined types were placed in this encounter.     Disposition:   FU with HS in 2 months  Signed, Sinclair Grooms, MD  07/08/2014 10:52 AM    [facility] [place], [place], [place]  [address] Phone: [phone]; Fax: [phone]

## 2014-07-08 NOTE — Patient Instructions (Addendum)
Medication Instructions:  Your physician has recommended you make the following change in your medication:  INCREASE Bumex to 1 and 1/2 tablet (1.5mg ) daily   Labwork: Your physician recommends that you return for lab work in: 2 weeks (Bmet)  Testing/Procedures: Your physician has requested that you have a lexiscan myoview. For further information please visit [email or web address]. Please follow instruction sheet, as given.   Follow-Up: Your physician recommends that you schedule a follow-up appointment in: 2 months with Dr.Smith 09/06/14 @ 3pm  Any Other Special Instructions Will Be Listed Below (If Applicable). Your physician recommends that you weigh, daily, at the same time every day, and in the same amount of clothing. Please record your daily weights. Call the office if your weight is up 3lbs in a day 5lbs in a week

## 2014-07-20 ENCOUNTER — Ambulatory Visit (INDEPENDENT_AMBULATORY_CARE_PROVIDER_SITE_OTHER): Payer: Medicare Other | Admitting: *Deleted

## 2014-07-20 DIAGNOSIS — G459 Transient cerebral ischemic attack, unspecified: Secondary | ICD-10-CM | POA: Diagnosis not present

## 2014-07-20 DIAGNOSIS — I639 Cerebral infarction, unspecified: Secondary | ICD-10-CM | POA: Diagnosis not present

## 2014-07-20 DIAGNOSIS — I480 Paroxysmal atrial fibrillation: Secondary | ICD-10-CM

## 2014-07-20 DIAGNOSIS — I4891 Unspecified atrial fibrillation: Secondary | ICD-10-CM | POA: Diagnosis not present

## 2014-07-20 DIAGNOSIS — Z5181 Encounter for therapeutic drug level monitoring: Secondary | ICD-10-CM | POA: Diagnosis not present

## 2014-07-20 LAB — POCT INR: INR: 1.9

## 2014-07-25 ENCOUNTER — Telehealth (HOSPITAL_COMMUNITY): Payer: Self-pay

## 2014-07-25 DIAGNOSIS — J209 Acute bronchitis, unspecified: Secondary | ICD-10-CM | POA: Diagnosis not present

## 2014-07-25 NOTE — Telephone Encounter (Signed)
Patient given detailed instructions per Myocardial Perfusion Study Information Sheet for test on 07-27-2014 at 11:45am. Patient Notified to arrive 15 minutes early, and that it is imperative to arrive on time for appointment to keep from having the test rescheduled. Patient verbalized understanding. Oletta Lamas, Oliwia Berzins A

## 2014-07-27 ENCOUNTER — Other Ambulatory Visit (INDEPENDENT_AMBULATORY_CARE_PROVIDER_SITE_OTHER): Payer: Medicare Other | Admitting: *Deleted

## 2014-07-27 ENCOUNTER — Ambulatory Visit (HOSPITAL_COMMUNITY): Payer: Medicare Other | Attending: Interventional Cardiology

## 2014-07-27 DIAGNOSIS — R0789 Other chest pain: Secondary | ICD-10-CM | POA: Insufficient documentation

## 2014-07-27 DIAGNOSIS — I5032 Chronic diastolic (congestive) heart failure: Secondary | ICD-10-CM

## 2014-07-27 LAB — MYOCARDIAL PERFUSION IMAGING
CHL CUP RESTING HR STRESS: 71 {beats}/min
LV dias vol: 90 mL
LVSYSVOL: 33 mL
Peak HR: 71 {beats}/min
RATE: 0.24
SDS: 2
SRS: 1
SSS: 3
TID: 1.1

## 2014-07-27 LAB — BASIC METABOLIC PANEL
BUN: 10 mg/dL (ref 6–23)
CHLORIDE: 97 meq/L (ref 96–112)
CO2: 33 meq/L — AB (ref 19–32)
CREATININE: 0.94 mg/dL (ref 0.40–1.20)
Calcium: 9.8 mg/dL (ref 8.4–10.5)
GFR: 73.78 mL/min (ref >60.00)
Glucose, Bld: 124 mg/dL — ABNORMAL HIGH (ref 70–99)
Potassium: 3.6 mEq/L (ref 3.5–5.1)
Sodium: 138 mEq/L (ref 135–145)

## 2014-07-27 MED ORDER — REGADENOSON 0.4 MG/5ML IV SOLN
0.4000 mg | Freq: Once | INTRAVENOUS | Status: AC
  Administered 2014-07-27: 0.4 mg via INTRAVENOUS

## 2014-07-27 MED ORDER — TECHNETIUM TC 99M SESTAMIBI GENERIC - CARDIOLITE
10.7000 | Freq: Once | INTRAVENOUS | Status: AC | PRN
  Administered 2014-07-27: 11 via INTRAVENOUS

## 2014-07-27 MED ORDER — TECHNETIUM TC 99M SESTAMIBI GENERIC - CARDIOLITE
30.7000 | Freq: Once | INTRAVENOUS | Status: AC | PRN
  Administered 2014-07-27: 30.7 via INTRAVENOUS

## 2014-07-27 NOTE — Addendum Note (Signed)
Addended by: Eulis Foster on: 07/27/2014 11:19 AM   Modules accepted: Orders

## 2014-07-28 ENCOUNTER — Telehealth: Payer: Self-pay | Admitting: Interventional Cardiology

## 2014-07-28 NOTE — Telephone Encounter (Signed)
New Message       Pt calling wanting her lab results and Stress test results mailed to her. Please call back and advise.

## 2014-08-03 DIAGNOSIS — Z96651 Presence of right artificial knee joint: Secondary | ICD-10-CM | POA: Diagnosis not present

## 2014-08-03 DIAGNOSIS — M75101 Unspecified rotator cuff tear or rupture of right shoulder, not specified as traumatic: Secondary | ICD-10-CM | POA: Diagnosis not present

## 2014-08-03 DIAGNOSIS — Z09 Encounter for follow-up examination after completed treatment for conditions other than malignant neoplasm: Secondary | ICD-10-CM | POA: Diagnosis not present

## 2014-08-05 NOTE — Telephone Encounter (Signed)
Completed.

## 2014-08-17 ENCOUNTER — Ambulatory Visit (INDEPENDENT_AMBULATORY_CARE_PROVIDER_SITE_OTHER): Payer: Medicare Other | Admitting: Pharmacist

## 2014-08-17 DIAGNOSIS — I639 Cerebral infarction, unspecified: Secondary | ICD-10-CM

## 2014-08-17 DIAGNOSIS — G459 Transient cerebral ischemic attack, unspecified: Secondary | ICD-10-CM

## 2014-08-17 DIAGNOSIS — Z5181 Encounter for therapeutic drug level monitoring: Secondary | ICD-10-CM | POA: Diagnosis not present

## 2014-08-17 DIAGNOSIS — I480 Paroxysmal atrial fibrillation: Secondary | ICD-10-CM

## 2014-08-17 DIAGNOSIS — I4891 Unspecified atrial fibrillation: Secondary | ICD-10-CM

## 2014-08-17 LAB — POCT INR: INR: 2.4

## 2014-08-22 DIAGNOSIS — H4011X2 Primary open-angle glaucoma, moderate stage: Secondary | ICD-10-CM | POA: Diagnosis not present

## 2014-09-05 NOTE — Progress Notes (Signed)
Cardiology Office Note   Date:  09/06/2014   ID:  [name], DOB 1932/06/24, MRN [id number]  PCP:  Shirline Frees, MD  Cardiologist:  Sinclair Grooms, MD   Chief Complaint  Patient presents with  . Congestive Heart Failure      History of Present Illness: [name] is a 81 y.o. female who presents for diastolic heart failure, hypertension, tachybradycardia syndrome, paroxysmal atrial fibrillation,  She feels that the weight has gradually decreased and her breathing is improved. She is physically more active now being limited only by right knee arthritis. She denies orthopnea and PND. No tightness or pressure in her chest. A myocardial perfusion study was done and was low risk.  Past Medical History  Diagnosis Date  . Depression   . Coronary atherosclerosis of native coronary artery   . Venous insufficiency   . Tachy-brady syndrome     With permanent pacemaker 04/03/99  . PAF (paroxysmal atrial fibrillation)     takes Coumadin daily  . Lichen planus   . Hypertension     takes Amlodipine and Diovan daily  . Chronic diastolic CHF (congestive heart failure)     takes Bumex daily  . Anxiety     takes Valium daily as needed  . Muscle spasm     takes Celebrex daily and Robaxin daily as needed  . Heart murmur   . TIA (transient ischemic attack)   . Pacemaker   . Pneumonia     as a child  . Dizziness     occasionally  . Joint pain   . Joint swelling   . Chronic back pain     stenosis  . History of gout     doesn't take any meds  . History of gastric ulcer   . History of colon polyps   . Diverticulosis   . Glaucoma     uses eye drops daily  . Chronic bronchitis     "get it practically q yr"   . Tuberculosis     during childhood unsure of time  . Type II diabetes mellitus     but doesn't take any meds (10/19/2013)  . Stroke 1990's    "found it in ~ 1997-1998; never even knew I'd had one"  . Osteoarthritis   . Arthritis     "joints"  . GERD  (gastroesophageal reflux disease)     Past Surgical History  Procedure Laterality Date  . Tonsillectomy    . Tubal ligation  1971  . Cataract extraction w/ intraocular lens  implant, bilateral Bilateral ?2005  . Back surgery    . Lumbar disc surgery  08/2006  . Cardiac catheterization  X 2  . Colonoscopy    . Esophagogastroduodenoscopy    . Total knee arthroplasty Right 10/19/2013  . Abdominal hysterectomy      w/BSO  . Insert / replace / remove pacemaker  04/03/99; ?2008  . Total knee arthroplasty Right 10/19/2013    Procedure: TOTAL KNEE ARTHROPLASTY;  Surgeon: Hessie Dibble, MD;  Location: [facility];  Service: Orthopedics;  Laterality: Right;     Current Outpatient Prescriptions  Medication Sig Dispense Refill  . Acetaminophen (TYLENOL ARTHRITIS PAIN PO) Take 1 tablet by mouth every 6 (six) hours as needed (arthritis).    Marland Kitchen b complex vitamins tablet Take 1 tablet by mouth daily.    . beta carotene 25000 UNIT capsule Take 25,000 Units by mouth 3 (three) times a week.     Marland Kitchen  bimatoprost (LUMIGAN) 0.01 % SOLN Place 1 drop into both eyes at bedtime.    . bumetanide (BUMEX) 1 MG tablet Take 1.5 tablets (1.5 mg total) by mouth daily. 45 tablet 5  . calcium carbonate (OS-CAL) 600 MG TABS Take 600 mg by mouth 3 (three) times a week.     . diazepam (VALIUM) 5 MG tablet Take 5 mg by mouth daily as needed for anxiety.     Marland Kitchen diltiazem (CARDIZEM LA) 240 MG 24 hr tablet Take 1 tablet (240 mg total) by mouth daily. 30 tablet 11  . Elderberry 575 MG/5ML SYRP Take 575 mg by mouth daily as needed (cold symptoms).     . Glycerin-Polysorbate 80 (REFRESH DRY EYE THERAPY OP) Apply 1 drop to eye daily as needed (dry eyes).    Marland Kitchen HYDROcodone-acetaminophen (NORCO/VICODIN) 5-325 MG per tablet Take 1-2 tablets by mouth every 4 (four) hours as needed (breakthrough pain). 50 tablet 0  . lidocaine (LIDODERM) 5 % Place 1 patch onto the skin daily as needed (pain).     . methocarbamol (ROBAXIN) 500 MG tablet Take 1  tablet (500 mg total) by mouth every 6 (six) hours as needed for muscle spasms. 50 tablet 0  . metoprolol succinate (TOPROL XL) 25 MG 24 hr tablet Take 1 tablet (25 mg total) by mouth daily. 30 tablet 11  . Multiple Vitamin (MULTIVITAMIN WITH MINERALS) TABS Take 1 tablet by mouth daily.    Marland Kitchen nystatin (MYCOSTATIN) 100000 UNIT/ML suspension Take 500,000 Units by mouth daily as needed (for mouth irritation).     Marland Kitchen OVER THE COUNTER MEDICATION Apply 1 application topically 2 (two) times daily as needed (TOENAIL FUNGUS). Med Name: "Formula 3"    . potassium chloride SA (K-DUR,KLOR-CON) 20 MEQ tablet Take 1 tablet (20 mEq total) by mouth daily. 90 tablet 3  . senna (SENOKOT) 8.6 MG tablet Take 2 tablets by mouth at bedtime.     . valsartan (DIOVAN) 320 MG tablet Take 1 tablet (320 mg total) by mouth daily. 30 tablet 6  . vitamin C (ASCORBIC ACID) 500 MG tablet Take 500 mg by mouth daily as needed (COLD SYMPTOMS).     Marland Kitchen warfarin (COUMADIN) 5 MG tablet Take as directed by coumadin clinic 120 tablet 0   No current facility-administered medications for this visit.    Allergies:   Procardia; Lasix; Levsin; and Prednisone    Social History:  The patient  reports that she has never smoked. She has never used smokeless tobacco. She reports that she does not drink alcohol or use illicit drugs.   Family History:  The patient's family history includes Cancer in her mother; Diabetes in her sister; Heart disease in her father; Heart failure in her sister; Hypertension in her father and mother. There is no history of Malignant hyperthermia.    ROS:  Please see the history of present illness.   Otherwise, review of systems are positive for right knee arthritis. Gradual increasing physical activity. Appetite is been stable. Depression has improved..   All other systems are reviewed and negative.    PHYSICAL EXAM: VS:  BP 112/76 mmHg  Pulse 87  Ht 5\' 2"  (1.575 m)  Wt 92.897 kg (204 lb 12.8 oz)  BMI 37.45 kg/m2   SpO2 98% , BMI Body mass index is 37.45 kg/(m^2). GEN: Well nourished, well developed, in no acute distress HEENT: normal Neck: no JVD, carotid bruits, or masses Cardiac: RRR; no murmurs, rubs, or gallops,no edema  Respiratory:  clear to auscultation  bilaterally, normal work of breathing GI: soft, nontender, nondistended, + BS MS: no deformity or atrophy Skin: warm and dry, no rash Neuro:  Strength and sensation are intact Psych: euthymic mood, full affect   EKG:  EKG is not ordered today.    Recent Labs: 10/21/2013: Hemoglobin 10.6*; Platelets 203 09/06/2014: BUN 15; Creatinine, Ser 0.99; Potassium 4.0; Sodium 140    Lipid Panel No results found for: CHOL, TRIG, HDL, CHOLHDL, VLDL, LDLCALC, LDLDIRECT    Wt Readings from Last 3 Encounters:  09/06/14 92.897 kg (204 lb 12.8 oz)  07/27/14 91.173 kg (201 lb)  07/08/14 91.354 kg (201 lb 6.4 oz)      Other studies Reviewed: Additional studies/ records that were reviewed today include: .    ASSESSMENT AND PLAN: 1. Chronic diastolic heart failure No clinical evidence of volume overload  2. Paroxysmal a-fib Controlled rate on current medical regimen  3. Long term current use of anticoagulant therapy No bleeding on Coumadin  4. Other specified transient cerebral ischemias No recurrence of neurological complaints  5. Pacemaker Normal pacemaker function    Current medicines are reviewed at length with the patient today.  The patient does not have concerns regarding medicines.  The following changes have been made:  no change  Labs/ tests ordered today include:   Orders Placed This Encounter  Procedures  . Basic metabolic panel     Disposition:   FU with HS in 6 months  Signed, Sinclair Grooms, MD  09/06/2014 6:34 PM    [facility] [place], [place],   [address] Phone: [phone]; Fax: [phone]

## 2014-09-06 ENCOUNTER — Ambulatory Visit (INDEPENDENT_AMBULATORY_CARE_PROVIDER_SITE_OTHER): Payer: Medicare Other | Admitting: Interventional Cardiology

## 2014-09-06 ENCOUNTER — Encounter: Payer: Self-pay | Admitting: Interventional Cardiology

## 2014-09-06 VITALS — BP 112/76 | HR 87 | Ht 62.0 in | Wt 204.8 lb

## 2014-09-06 DIAGNOSIS — I639 Cerebral infarction, unspecified: Secondary | ICD-10-CM

## 2014-09-06 DIAGNOSIS — G458 Other transient cerebral ischemic attacks and related syndromes: Secondary | ICD-10-CM | POA: Diagnosis not present

## 2014-09-06 DIAGNOSIS — Z950 Presence of cardiac pacemaker: Secondary | ICD-10-CM

## 2014-09-06 DIAGNOSIS — I480 Paroxysmal atrial fibrillation: Secondary | ICD-10-CM

## 2014-09-06 DIAGNOSIS — I5032 Chronic diastolic (congestive) heart failure: Secondary | ICD-10-CM

## 2014-09-06 DIAGNOSIS — Z7901 Long term (current) use of anticoagulants: Secondary | ICD-10-CM

## 2014-09-06 LAB — BASIC METABOLIC PANEL
BUN: 15 mg/dL (ref 6–23)
CO2: 34 mEq/L — ABNORMAL HIGH (ref 19–32)
Calcium: 9.6 mg/dL (ref 8.4–10.5)
Chloride: 100 mEq/L (ref 96–112)
Creatinine, Ser: 0.99 mg/dL (ref 0.40–1.20)
GFR: 69.47 mL/min (ref >60.00)
Glucose, Bld: 103 mg/dL — ABNORMAL HIGH (ref 70–99)
Potassium: 4 mEq/L (ref 3.5–5.1)
SODIUM: 140 meq/L (ref 135–145)

## 2014-09-06 NOTE — Patient Instructions (Signed)
Medication Instructions:  Your physician recommends that you continue on your current medications as directed. Please refer to the Current Medication list given to you today.   Labwork: Bmet today  Testing/Procedures: None   Follow-Up: Your physician wants you to follow-up in: 6 months with Dr.Smith You will receive a reminder letter in the mail two months in advance. If you don't receive a letter, please call our office to schedule the follow-up appointment.   Any Other Special Instructions Will Be Listed Below (If Applicable).

## 2014-09-08 ENCOUNTER — Telehealth: Payer: Self-pay

## 2014-09-08 NOTE — Telephone Encounter (Signed)
Pt aware of lab results. Labs are normal. Pt verbalized understanding

## 2014-09-08 NOTE — Telephone Encounter (Signed)
-----   Message from Belva Crome, MD sent at 09/06/2014  7:52 PM EDT ----- Labs are normal.

## 2014-09-13 ENCOUNTER — Ambulatory Visit (INDEPENDENT_AMBULATORY_CARE_PROVIDER_SITE_OTHER): Payer: Medicare Other | Admitting: *Deleted

## 2014-09-13 DIAGNOSIS — I4891 Unspecified atrial fibrillation: Secondary | ICD-10-CM

## 2014-09-13 DIAGNOSIS — I480 Paroxysmal atrial fibrillation: Secondary | ICD-10-CM | POA: Diagnosis not present

## 2014-09-13 DIAGNOSIS — Z5181 Encounter for therapeutic drug level monitoring: Secondary | ICD-10-CM

## 2014-09-13 DIAGNOSIS — I639 Cerebral infarction, unspecified: Secondary | ICD-10-CM | POA: Diagnosis not present

## 2014-09-13 LAB — POCT INR: INR: 3.6

## 2014-09-15 ENCOUNTER — Other Ambulatory Visit: Payer: Self-pay | Admitting: Family Medicine

## 2014-09-15 DIAGNOSIS — M25511 Pain in right shoulder: Secondary | ICD-10-CM | POA: Diagnosis not present

## 2014-09-15 DIAGNOSIS — R51 Headache: Secondary | ICD-10-CM | POA: Diagnosis not present

## 2014-09-15 DIAGNOSIS — I10 Essential (primary) hypertension: Secondary | ICD-10-CM | POA: Diagnosis not present

## 2014-09-15 DIAGNOSIS — G4489 Other headache syndrome: Secondary | ICD-10-CM

## 2014-09-16 ENCOUNTER — Ambulatory Visit
Admission: RE | Admit: 2014-09-16 | Discharge: 2014-09-16 | Disposition: A | Discharge status: Home / Self Care | Payer: Medicare Other | Source: Ambulatory Visit | Attending: Family Medicine | Admitting: Family Medicine

## 2014-09-16 DIAGNOSIS — R51 Headache: Secondary | ICD-10-CM | POA: Diagnosis not present

## 2014-09-16 DIAGNOSIS — G4489 Other headache syndrome: Secondary | ICD-10-CM

## 2014-09-19 DIAGNOSIS — I10 Essential (primary) hypertension: Secondary | ICD-10-CM | POA: Diagnosis not present

## 2014-09-19 DIAGNOSIS — M25561 Pain in right knee: Secondary | ICD-10-CM | POA: Diagnosis not present

## 2014-09-19 DIAGNOSIS — E119 Type 2 diabetes mellitus without complications: Secondary | ICD-10-CM | POA: Diagnosis not present

## 2014-09-19 DIAGNOSIS — E780 Pure hypercholesterolemia: Secondary | ICD-10-CM | POA: Diagnosis not present

## 2014-09-19 DIAGNOSIS — Z23 Encounter for immunization: Secondary | ICD-10-CM | POA: Diagnosis not present

## 2014-09-19 DIAGNOSIS — L309 Dermatitis, unspecified: Secondary | ICD-10-CM | POA: Diagnosis not present

## 2014-09-19 DIAGNOSIS — G44201 Tension-type headache, unspecified, intractable: Secondary | ICD-10-CM | POA: Diagnosis not present

## 2014-09-27 ENCOUNTER — Ambulatory Visit (INDEPENDENT_AMBULATORY_CARE_PROVIDER_SITE_OTHER): Payer: Medicare Other | Admitting: Pharmacist

## 2014-09-27 DIAGNOSIS — I4891 Unspecified atrial fibrillation: Secondary | ICD-10-CM | POA: Diagnosis not present

## 2014-09-27 DIAGNOSIS — I639 Cerebral infarction, unspecified: Secondary | ICD-10-CM

## 2014-09-27 DIAGNOSIS — Z5181 Encounter for therapeutic drug level monitoring: Secondary | ICD-10-CM | POA: Diagnosis not present

## 2014-09-27 DIAGNOSIS — I480 Paroxysmal atrial fibrillation: Secondary | ICD-10-CM

## 2014-09-27 DIAGNOSIS — G459 Transient cerebral ischemic attack, unspecified: Secondary | ICD-10-CM

## 2014-09-27 LAB — POCT INR: INR: 2

## 2014-09-30 ENCOUNTER — Other Ambulatory Visit: Payer: Self-pay | Admitting: *Deleted

## 2014-09-30 MED ORDER — WARFARIN SODIUM 5 MG PO TABS: ORAL_TABLET | ORAL | Status: DC

## 2014-10-06 DIAGNOSIS — H4011X2 Primary open-angle glaucoma, moderate stage: Secondary | ICD-10-CM | POA: Diagnosis not present

## 2014-10-06 DIAGNOSIS — H26492 Other secondary cataract, left eye: Secondary | ICD-10-CM | POA: Diagnosis not present

## 2014-10-17 DIAGNOSIS — H15002 Unspecified scleritis, left eye: Secondary | ICD-10-CM | POA: Diagnosis not present

## 2014-10-18 ENCOUNTER — Ambulatory Visit (INDEPENDENT_AMBULATORY_CARE_PROVIDER_SITE_OTHER): Payer: Medicare Other | Admitting: Pharmacist

## 2014-10-18 DIAGNOSIS — I480 Paroxysmal atrial fibrillation: Secondary | ICD-10-CM

## 2014-10-18 DIAGNOSIS — G459 Transient cerebral ischemic attack, unspecified: Secondary | ICD-10-CM | POA: Diagnosis not present

## 2014-10-18 DIAGNOSIS — Z5181 Encounter for therapeutic drug level monitoring: Secondary | ICD-10-CM

## 2014-10-18 DIAGNOSIS — I4891 Unspecified atrial fibrillation: Secondary | ICD-10-CM | POA: Diagnosis not present

## 2014-10-18 DIAGNOSIS — I639 Cerebral infarction, unspecified: Secondary | ICD-10-CM

## 2014-10-18 LAB — POCT INR: INR: 1.8

## 2014-10-21 DIAGNOSIS — H04123 Dry eye syndrome of bilateral lacrimal glands: Secondary | ICD-10-CM | POA: Diagnosis not present

## 2014-10-21 DIAGNOSIS — H15002 Unspecified scleritis, left eye: Secondary | ICD-10-CM | POA: Diagnosis not present

## 2014-10-31 DIAGNOSIS — M7542 Impingement syndrome of left shoulder: Secondary | ICD-10-CM | POA: Diagnosis not present

## 2014-10-31 DIAGNOSIS — Z96651 Presence of right artificial knee joint: Secondary | ICD-10-CM | POA: Diagnosis not present

## 2014-11-02 DIAGNOSIS — L433 Subacute (active) lichen planus: Secondary | ICD-10-CM | POA: Diagnosis not present

## 2014-11-02 DIAGNOSIS — D225 Melanocytic nevi of trunk: Secondary | ICD-10-CM | POA: Diagnosis not present

## 2014-11-02 DIAGNOSIS — L918 Other hypertrophic disorders of the skin: Secondary | ICD-10-CM | POA: Diagnosis not present

## 2014-11-02 DIAGNOSIS — L720 Epidermal cyst: Secondary | ICD-10-CM | POA: Diagnosis not present

## 2014-11-02 DIAGNOSIS — L821 Other seborrheic keratosis: Secondary | ICD-10-CM | POA: Diagnosis not present

## 2014-11-02 DIAGNOSIS — D1801 Hemangioma of skin and subcutaneous tissue: Secondary | ICD-10-CM | POA: Diagnosis not present

## 2014-11-08 ENCOUNTER — Ambulatory Visit (INDEPENDENT_AMBULATORY_CARE_PROVIDER_SITE_OTHER): Payer: Medicare Other | Admitting: *Deleted

## 2014-11-08 DIAGNOSIS — H04123 Dry eye syndrome of bilateral lacrimal glands: Secondary | ICD-10-CM | POA: Diagnosis not present

## 2014-11-08 DIAGNOSIS — I4891 Unspecified atrial fibrillation: Secondary | ICD-10-CM

## 2014-11-08 DIAGNOSIS — Z5181 Encounter for therapeutic drug level monitoring: Secondary | ICD-10-CM

## 2014-11-08 DIAGNOSIS — H15002 Unspecified scleritis, left eye: Secondary | ICD-10-CM | POA: Diagnosis not present

## 2014-11-08 DIAGNOSIS — I480 Paroxysmal atrial fibrillation: Secondary | ICD-10-CM

## 2014-11-08 DIAGNOSIS — G459 Transient cerebral ischemic attack, unspecified: Secondary | ICD-10-CM | POA: Diagnosis not present

## 2014-11-08 LAB — POCT INR: INR: 3.5

## 2014-11-22 ENCOUNTER — Ambulatory Visit (INDEPENDENT_AMBULATORY_CARE_PROVIDER_SITE_OTHER): Payer: Medicare Other | Admitting: Podiatry

## 2014-11-22 ENCOUNTER — Encounter: Payer: Self-pay | Admitting: Podiatry

## 2014-11-22 VITALS — BP 138/69 | HR 72 | Resp 12

## 2014-11-22 DIAGNOSIS — M79675 Pain in left toe(s): Secondary | ICD-10-CM | POA: Diagnosis not present

## 2014-11-22 DIAGNOSIS — B351 Tinea unguium: Secondary | ICD-10-CM | POA: Diagnosis not present

## 2014-11-22 DIAGNOSIS — I639 Cerebral infarction, unspecified: Secondary | ICD-10-CM

## 2014-11-22 DIAGNOSIS — M79674 Pain in right toe(s): Secondary | ICD-10-CM

## 2014-11-22 NOTE — Progress Notes (Signed)
   Subjective:    Patient ID: [name], female    DOB: 27-Dec-1932, 81 y.o.   MRN: [id number]  HPI Patient says she is prediabetic and denies taking any medications for diabetes. She denies any history of foot ulceration or claudication. She is here today complaining of painful toenails and requesting nail debridement. She says the nails have become progressively longer more comfortable in the past several months. Patient is recovering from knee surgery and states that is the reason she is not presented to our office for nail debridement. Last schedule visit in office was 07/20/2013 She denies any history of foot ulceration, claudication, or amputation She denies taking medication for diabetes  Review of Systems  Musculoskeletal: Positive for joint swelling and gait problem.       Objective:   Physical Exam  Orientated 3  Vascular: Pitting edema bilaterally DP and PT pulses 2/4 bilaterally Capillary reflex immediate bilaterally  Neurological: Sensation to 10 g monofilament wire intact 5/5 bilaterally Vibratory sensation reactive bilaterally Ankle reflex equal and reactive bilaterally  Dermatological: No open skin lesions noted bilaterally The toenails are elongated, brittle, incurvated, discolored and tender to direct palpation 6-10 A soft, fluctuant cystic like lesion noted dorsal medial aspect left first MPJ. Not tender to palpation  Musculoskeletal: HAV deformities bilaterally       assment & Plan:    assessment: Per patient she is prediabetic Diabetic without complications Satisfactory neurovascular status bilaterally Symptomatic onychomycoses 6-10    Plan: Debridement toenails 10 mechanical and electrically without any bleeding   Reappoint 3 months

## 2014-11-22 NOTE — Patient Instructions (Signed)
Your diabetic foot examination today demonstrated adequate pulsation and feeling in your right and feet The toenails are elongated and discolored, probably fungal and I debrided them Return as needed or every 3 months Diabetes and Foot Care Diabetes may cause you to have problems because of poor blood supply (circulation) to your feet and legs. This may cause the skin on your feet to become thinner, break easier, and heal more slowly. Your skin may become dry, and the skin may peel and crack. You may also have nerve damage in your legs and feet causing decreased feeling in them. You may not notice minor injuries to your feet that could lead to infections or more serious problems. Taking care of your feet is one of the most important things you can do for yourself.  HOME CARE INSTRUCTIONS  Wear shoes at all times, even in the house. Do not go barefoot. Bare feet are easily injured.  Check your feet daily for blisters, cuts, and redness. If you cannot see the bottom of your feet, use a mirror or ask someone for help.  Wash your feet with warm water (do not use hot water) and mild soap. Then pat your feet and the areas between your toes until they are completely dry. Do not soak your feet as this can dry your skin.  Apply a moisturizing lotion or petroleum jelly (that does not contain alcohol and is unscented) to the skin on your feet and to dry, brittle toenails. Do not apply lotion between your toes.  Trim your toenails straight across. Do not dig under them or around the cuticle. File the edges of your nails with an emery board or nail file.  Do not cut corns or calluses or try to remove them with medicine.  Wear clean socks or stockings every day. Make sure they are not too tight. Do not wear knee-high stockings since they may decrease blood flow to your legs.  Wear shoes that fit properly and have enough cushioning. To break in new shoes, wear them for just a few hours a day. This prevents you  from injuring your feet. Always look in your shoes before you put them on to be sure there are no objects inside.  Do not cross your legs. This may decrease the blood flow to your feet.  If you find a minor scrape, cut, or break in the skin on your feet, keep it and the skin around it clean and dry. These areas may be cleansed with mild soap and water. Do not cleanse the area with peroxide, alcohol, or iodine.  When you remove an adhesive bandage, be sure not to damage the skin around it.  If you have a wound, look at it several times a day to make sure it is healing.  Do not use heating pads or hot water bottles. They may burn your skin. If you have lost feeling in your feet or legs, you may not know it is happening until it is too late.  Make sure your health care provider performs a complete foot exam at least annually or more often if you have foot problems. Report any cuts, sores, or bruises to your health care provider immediately. SEEK MEDICAL CARE IF:   You have an injury that is not healing.  You have cuts or breaks in the skin.  You have an ingrown nail.  You notice redness on your legs or feet.  You feel burning or tingling in your legs or feet.  You have pain or cramps in your legs and feet.  Your legs or feet are numb.  Your feet always feel cold. SEEK IMMEDIATE MEDICAL CARE IF:   There is increasing redness, swelling, or pain in or around a wound.  There is a red line that goes up your leg.  Pus is coming from a wound.  You develop a fever or as directed by your health care provider.  You notice a bad smell coming from an ulcer or wound.   This information is not intended to replace advice given to you by your health care provider. Make sure you discuss any questions you have with your health care provider.   Document Released: 01/12/2000 Document Revised: 09/16/2012 Document Reviewed: 06/23/2012 Elsevier Interactive Patient Education 2016 Elsevier Inc.  

## 2014-12-02 ENCOUNTER — Ambulatory Visit (INDEPENDENT_AMBULATORY_CARE_PROVIDER_SITE_OTHER): Payer: Medicare Other | Admitting: *Deleted

## 2014-12-02 DIAGNOSIS — G459 Transient cerebral ischemic attack, unspecified: Secondary | ICD-10-CM

## 2014-12-02 DIAGNOSIS — I480 Paroxysmal atrial fibrillation: Secondary | ICD-10-CM | POA: Diagnosis not present

## 2014-12-02 DIAGNOSIS — I4891 Unspecified atrial fibrillation: Secondary | ICD-10-CM | POA: Diagnosis not present

## 2014-12-02 DIAGNOSIS — Z5181 Encounter for therapeutic drug level monitoring: Secondary | ICD-10-CM | POA: Diagnosis not present

## 2014-12-02 LAB — POCT INR: INR: 1.8

## 2014-12-15 ENCOUNTER — Ambulatory Visit (INDEPENDENT_AMBULATORY_CARE_PROVIDER_SITE_OTHER): Payer: Medicare Other | Admitting: *Deleted

## 2014-12-15 DIAGNOSIS — G459 Transient cerebral ischemic attack, unspecified: Secondary | ICD-10-CM | POA: Diagnosis not present

## 2014-12-15 DIAGNOSIS — I4891 Unspecified atrial fibrillation: Secondary | ICD-10-CM

## 2014-12-15 DIAGNOSIS — Z5181 Encounter for therapeutic drug level monitoring: Secondary | ICD-10-CM | POA: Diagnosis not present

## 2014-12-15 DIAGNOSIS — I480 Paroxysmal atrial fibrillation: Secondary | ICD-10-CM | POA: Diagnosis not present

## 2014-12-15 LAB — POCT INR: INR: 2.3

## 2014-12-16 DIAGNOSIS — H401132 Primary open-angle glaucoma, bilateral, moderate stage: Secondary | ICD-10-CM | POA: Diagnosis not present

## 2014-12-16 DIAGNOSIS — H04123 Dry eye syndrome of bilateral lacrimal glands: Secondary | ICD-10-CM | POA: Diagnosis not present

## 2014-12-16 DIAGNOSIS — H15002 Unspecified scleritis, left eye: Secondary | ICD-10-CM | POA: Diagnosis not present

## 2014-12-21 ENCOUNTER — Other Ambulatory Visit: Payer: Self-pay | Admitting: *Deleted

## 2014-12-21 DIAGNOSIS — H401132 Primary open-angle glaucoma, bilateral, moderate stage: Secondary | ICD-10-CM | POA: Diagnosis not present

## 2014-12-21 DIAGNOSIS — H04123 Dry eye syndrome of bilateral lacrimal glands: Secondary | ICD-10-CM | POA: Diagnosis not present

## 2014-12-21 DIAGNOSIS — H15002 Unspecified scleritis, left eye: Secondary | ICD-10-CM | POA: Diagnosis not present

## 2014-12-21 MED ORDER — VALSARTAN 320 MG PO TABS: 320.0000 mg | ORAL_TABLET | Freq: Every day | ORAL | Status: DC

## 2015-01-04 ENCOUNTER — Ambulatory Visit (INDEPENDENT_AMBULATORY_CARE_PROVIDER_SITE_OTHER): Payer: Medicare Other | Admitting: *Deleted

## 2015-01-04 DIAGNOSIS — I480 Paroxysmal atrial fibrillation: Secondary | ICD-10-CM | POA: Diagnosis not present

## 2015-01-04 DIAGNOSIS — Z5181 Encounter for therapeutic drug level monitoring: Secondary | ICD-10-CM | POA: Diagnosis not present

## 2015-01-04 DIAGNOSIS — I4891 Unspecified atrial fibrillation: Secondary | ICD-10-CM | POA: Diagnosis not present

## 2015-01-04 DIAGNOSIS — H401132 Primary open-angle glaucoma, bilateral, moderate stage: Secondary | ICD-10-CM | POA: Diagnosis not present

## 2015-01-04 DIAGNOSIS — H04123 Dry eye syndrome of bilateral lacrimal glands: Secondary | ICD-10-CM | POA: Diagnosis not present

## 2015-01-04 DIAGNOSIS — G459 Transient cerebral ischemic attack, unspecified: Secondary | ICD-10-CM

## 2015-01-04 DIAGNOSIS — H15002 Unspecified scleritis, left eye: Secondary | ICD-10-CM | POA: Diagnosis not present

## 2015-01-04 LAB — POCT INR: INR: 2

## 2015-02-01 ENCOUNTER — Other Ambulatory Visit: Payer: Self-pay | Admitting: Family Medicine

## 2015-02-01 ENCOUNTER — Ambulatory Visit
Admission: RE | Admit: 2015-02-01 | Discharge: 2015-02-01 | Disposition: A | Discharge status: Home / Self Care | Payer: Medicare Other | Source: Ambulatory Visit | Attending: Family Medicine | Admitting: Family Medicine

## 2015-02-01 DIAGNOSIS — J40 Bronchitis, not specified as acute or chronic: Secondary | ICD-10-CM | POA: Diagnosis not present

## 2015-02-01 DIAGNOSIS — R0789 Other chest pain: Secondary | ICD-10-CM | POA: Diagnosis not present

## 2015-02-01 DIAGNOSIS — M549 Dorsalgia, unspecified: Secondary | ICD-10-CM | POA: Diagnosis not present

## 2015-02-01 DIAGNOSIS — F419 Anxiety disorder, unspecified: Secondary | ICD-10-CM | POA: Diagnosis not present

## 2015-02-02 ENCOUNTER — Ambulatory Visit (INDEPENDENT_AMBULATORY_CARE_PROVIDER_SITE_OTHER): Payer: Medicare Other | Admitting: *Deleted

## 2015-02-02 DIAGNOSIS — G459 Transient cerebral ischemic attack, unspecified: Secondary | ICD-10-CM | POA: Diagnosis not present

## 2015-02-02 DIAGNOSIS — Z5181 Encounter for therapeutic drug level monitoring: Secondary | ICD-10-CM

## 2015-02-02 DIAGNOSIS — I4891 Unspecified atrial fibrillation: Secondary | ICD-10-CM

## 2015-02-02 DIAGNOSIS — I480 Paroxysmal atrial fibrillation: Secondary | ICD-10-CM | POA: Diagnosis not present

## 2015-02-02 LAB — POCT INR: INR: 2

## 2015-02-03 ENCOUNTER — Telehealth: Payer: Self-pay | Admitting: Interventional Cardiology

## 2015-02-03 MED ORDER — DILTIAZEM HCL ER COATED BEADS 240 MG PO CP24: 240.0000 mg | ORAL_CAPSULE | Freq: Every day | ORAL | Status: DC

## 2015-02-03 NOTE — Telephone Encounter (Signed)
[facility] pharmacy requesting a new script be sent in for pt. Pt's insurance does not cover Diltiazem (Cardizem LA) 240 mg 24 hr Pt's insurance prefers Diltiazem ER 240 mg capsules. Please advise

## 2015-02-03 NOTE — Telephone Encounter (Signed)
Per Audry Riles. Ok to switch for Diltiazem(Cardizem LA) 24 hr 240mg  to Diltiazem ER 240mg  daily. Rx sent to [facility] as requested

## 2015-02-06 ENCOUNTER — Telehealth: Payer: Self-pay

## 2015-02-06 NOTE — Telephone Encounter (Signed)
Prior auth for Diltiazem ER 240 tabs sent to [facility].

## 2015-02-08 DIAGNOSIS — H26492 Other secondary cataract, left eye: Secondary | ICD-10-CM | POA: Diagnosis not present

## 2015-02-08 DIAGNOSIS — H401132 Primary open-angle glaucoma, bilateral, moderate stage: Secondary | ICD-10-CM | POA: Diagnosis not present

## 2015-02-08 DIAGNOSIS — H04123 Dry eye syndrome of bilateral lacrimal glands: Secondary | ICD-10-CM | POA: Diagnosis not present

## 2015-02-08 DIAGNOSIS — H15002 Unspecified scleritis, left eye: Secondary | ICD-10-CM | POA: Diagnosis not present

## 2015-02-15 ENCOUNTER — Encounter: Payer: Self-pay | Admitting: Internal Medicine

## 2015-02-15 ENCOUNTER — Ambulatory Visit (INDEPENDENT_AMBULATORY_CARE_PROVIDER_SITE_OTHER): Payer: Medicare Other | Admitting: Internal Medicine

## 2015-02-15 VITALS — BP 144/86 | HR 80 | Ht 62.0 in | Wt 204.0 lb

## 2015-02-15 DIAGNOSIS — Z950 Presence of cardiac pacemaker: Secondary | ICD-10-CM | POA: Diagnosis not present

## 2015-02-15 DIAGNOSIS — I480 Paroxysmal atrial fibrillation: Secondary | ICD-10-CM | POA: Diagnosis not present

## 2015-02-15 DIAGNOSIS — I495 Sick sinus syndrome: Secondary | ICD-10-CM

## 2015-02-15 DIAGNOSIS — I10 Essential (primary) hypertension: Secondary | ICD-10-CM | POA: Diagnosis not present

## 2015-02-15 DIAGNOSIS — I5032 Chronic diastolic (congestive) heart failure: Secondary | ICD-10-CM

## 2015-02-15 NOTE — Assessment & Plan Note (Signed)
Her symptoms are class 2A. She will continue her current meds. 

## 2015-02-15 NOTE — Assessment & Plan Note (Signed)
Her medtronic DDD PM is working normally. Will recheck in several months. 

## 2015-02-15 NOTE — Patient Instructions (Signed)
Your physician recommends that you continue on your current medications as directed. Please refer to the Current Medication list given to you today. Your physician wants you to follow-up in: 6 months with [facility].   You will receive a reminder letter in the mail two months in advance. If you don't receive a letter, please call our office to schedule the follow-up appointment. Your physician wants you to follow-up in: 1 year with Dr. Lovena Le.   You will receive a reminder letter in the mail two months in advance. If you don't receive a letter, please call our office to schedule the follow-up appointment.

## 2015-02-15 NOTE — Assessment & Plan Note (Signed)
Her blood pressure is slightly increased. She is encouraged to reduce her sodium intake.

## 2015-02-15 NOTE — Assessment & Plan Note (Signed)
Her atrial fib has been chronic now. We have reprogrammed her DDI at 15.

## 2015-02-15 NOTE — Progress Notes (Signed)
HPI [name] returns today for ongoing evaluation and management of her Medtronic dual-chamber pacemaker. The patient has a history of sinus node dysfunction and is status post permanent pacemaker insertion, initially in 2001. She underwent generator change out in 2008. She has chronic class II diastolic heart failure, and obesity. She also has a history of gout. In addition she has a history of paroxysmal atrial fibrillation though she is maintaining sinus rhythm very nicely. She denies anginal symptoms. No peripheral edema. No syncope. She remains active. She c/o left shoulder soreness and has had some bone spurs.  Allergies  Allergen Reactions  . Procardia [Nifedipine] Other (See Comments)    GI uspet  . Lasix [Furosemide] Other (See Comments)    "Too strong" - tolerates Bumex  . Levsin [Hyoscyamine Sulfate] Other (See Comments)    unknown  . Prednisone Palpitations and Other (See Comments)    Tachycardia, fluttering  Can tolerate low doses     Current Outpatient Prescriptions  Medication Sig Dispense Refill  . Acetaminophen (TYLENOL ARTHRITIS PAIN PO) Take 1 tablet by mouth every 6 (six) hours as needed (arthritis).    Marland Kitchen b complex vitamins tablet Take 1 tablet by mouth daily.    . beta carotene 25000 UNIT capsule Take 25,000 Units by mouth 3 (three) times a week.     . bimatoprost (LUMIGAN) 0.01 % SOLN Place 1 drop into both eyes at bedtime.    . bumetanide (BUMEX) 1 MG tablet Take 1.5 tablets (1.5 mg total) by mouth daily. 45 tablet 5  . calcium carbonate (OS-CAL) 600 MG TABS Take 600 mg by mouth 3 (three) times a week.     . diazepam (VALIUM) 5 MG tablet Take 5 mg by mouth daily as needed for anxiety.     Marland Kitchen diltiazem (CARDIZEM CD) 240 MG 24 hr capsule Take 1 capsule (240 mg total) by mouth daily. 30 capsule 5  . Elderberry 575 MG/5ML SYRP Take 575 mg by mouth daily as needed (cold symptoms).     . Glycerin-Polysorbate 80 (REFRESH DRY EYE THERAPY OP) Apply 1 drop to  eye daily as needed (dry eyes).    Marland Kitchen HYDROcodone-acetaminophen (NORCO/VICODIN) 5-325 MG per tablet Take 1-2 tablets by mouth every 4 (four) hours as needed (breakthrough pain). 50 tablet 0  . lidocaine (LIDODERM) 5 % Place 1 patch onto the skin daily as needed (pain).     . methocarbamol (ROBAXIN) 500 MG tablet Take 1 tablet (500 mg total) by mouth every 6 (six) hours as needed for muscle spasms. 50 tablet 0  . metoprolol succinate (TOPROL XL) 25 MG 24 hr tablet Take 1 tablet (25 mg total) by mouth daily. 30 tablet 11  . Multiple Vitamin (MULTIVITAMIN WITH MINERALS) TABS Take 1 tablet by mouth daily.    Marland Kitchen nystatin (MYCOSTATIN) 100000 UNIT/ML suspension Take 500,000 Units by mouth daily as needed (for mouth irritation).     Marland Kitchen OVER THE COUNTER MEDICATION Apply 1 application topically 2 (two) times daily as needed (TOENAIL FUNGUS). Med Name: "Formula 3"    . potassium chloride SA (K-DUR,KLOR-CON) 20 MEQ tablet Take 1 tablet (20 mEq total) by mouth daily. 90 tablet 3  . senna (SENOKOT) 8.6 MG tablet Take 2 tablets by mouth at bedtime.     . valsartan (DIOVAN) 320 MG tablet Take 1 tablet (320 mg total) by mouth daily. 30 tablet 10  . vitamin C (ASCORBIC ACID) 500 MG tablet Take 500 mg by mouth daily as needed (  COLD SYMPTOMS).     Marland Kitchen warfarin (COUMADIN) 5 MG tablet Take as directed by coumadin clinic 120 tablet 1   No current facility-administered medications for this visit.     Past Medical History  Diagnosis Date  . Depression   . Coronary atherosclerosis of native coronary artery   . Venous insufficiency   . Tachy-brady syndrome ([place])     With permanent pacemaker 04/03/99  . PAF (paroxysmal atrial fibrillation) ([facility])     takes Coumadin daily  . Lichen planus   . Hypertension     takes Amlodipine and Diovan daily  . Chronic diastolic CHF (congestive heart failure) ([facility])     takes Bumex daily  . Anxiety     takes Valium daily as needed  . Muscle spasm     takes Celebrex daily and Robaxin  daily as needed  . Heart murmur   . TIA (transient ischemic attack)   . Pacemaker   . Pneumonia     as a child  . Dizziness     occasionally  . Joint pain   . Joint swelling   . Chronic back pain     stenosis  . History of gout     doesn't take any meds  . History of gastric ulcer   . History of colon polyps   . Diverticulosis   . Glaucoma     uses eye drops daily  . Chronic bronchitis ([place])     "get it practically q yr"   . Tuberculosis     during childhood unsure of time  . Type II diabetes mellitus ([place])     but doesn't take any meds (10/19/2013)  . Stroke [facility]) 1990's    "found it in ~ 1997-1998; never even knew I'd had one"  . Osteoarthritis   . Arthritis     "joints"  . GERD (gastroesophageal reflux disease)     ROS:   All systems reviewed and negative except as noted in the HPI.   Past Surgical History  Procedure Laterality Date  . Tonsillectomy    . Tubal ligation  1971  . Cataract extraction w/ intraocular lens  implant, bilateral Bilateral ?2005  . Back surgery    . Lumbar disc surgery  08/2006  . Cardiac catheterization  X 2  . Colonoscopy    . Esophagogastroduodenoscopy    . Total knee arthroplasty Right 10/19/2013  . Abdominal hysterectomy      w/BSO  . Insert / replace / remove pacemaker  04/03/99; ?2008  . Total knee arthroplasty Right 10/19/2013    Procedure: TOTAL KNEE ARTHROPLASTY;  Surgeon: Hessie Dibble, MD;  Location: [facility];  Service: Orthopedics;  Laterality: Right;     Family History  Problem Relation Age of Onset  . Malignant hyperthermia Neg Hx   . Hypertension Mother   . Cancer Mother     Maglignant Brain Tumor  . Heart disease Father   . Hypertension Father   . Heart failure Sister   . Diabetes Sister      Social History   Social History  . Marital Status: Widowed    Spouse Name: N/A  . Number of Children: N/A  . Years of Education: N/A   Occupational History  . Not on file.   Social History Main Topics  .  Smoking status: Never Smoker   . Smokeless tobacco: Never Used  . Alcohol Use: No  . Drug Use: No  . Sexual Activity: No   Other Topics  Concern  . Not on file   Social History Narrative     BP 144/86 mmHg  Pulse 80  Ht 5\' 2"  (1.575 m)  Wt 204 lb (92.534 kg)  BMI 37.30 kg/m2  Physical Exam:  Well appearing  Obese, 82 year old woman, NAD HEENT: Unremarkable Neck:  6 cm JVD, no thyromegally Back:  No CVA tenderness Lungs:  Clear with no wheezes, rales, or rhonchi. HEART:  Regular rate rhythm, no murmurs, no rubs, no clicks Abd:  soft, positive bowel sounds, no organomegally, no rebound, no guarding Ext:  2 plus pulses, no edema, no cyanosis, no clubbing Skin:  No rashes no nodules Neuro:  CN II through XII intact, motor grossly intact  DEVICE  Normal device function.  See PaceArt for details.   Assess/Plan:

## 2015-02-21 ENCOUNTER — Telehealth: Payer: Self-pay

## 2015-02-21 LAB — CUP PACEART INCLINIC DEVICE CHECK
Brady Statistic AP VP Percent: 2 %
Brady Statistic AP VS Percent: 92 %
Brady Statistic AS VP Percent: 1 %
Implantable Lead Implant Date: 20010306
Implantable Lead Location: 753859
Implantable Lead Location: 753860
Implantable Lead Model: 5076
Implantable Lead Model: 5092
Lead Channel Impedance Value: 436 Ohm
Lead Channel Impedance Value: 595 Ohm
Lead Channel Pacing Threshold Amplitude: 2 V
Lead Channel Pacing Threshold Pulse Width: 0.4 ms
Lead Channel Sensing Intrinsic Amplitude: 1.4 mV
MDC IDC LEAD IMPLANT DT: 20010306
MDC IDC MSMT BATTERY IMPEDANCE: 1368 Ohm
MDC IDC MSMT BATTERY REMAINING LONGEVITY: 27 mo
MDC IDC MSMT BATTERY VOLTAGE: 2.73 V
MDC IDC MSMT LEADCHNL RV SENSING INTR AMPL: 5.6 mV
MDC IDC SESS DTM: 20170118151809
MDC IDC SET LEADCHNL RA PACING AMPLITUDE: 2 V
MDC IDC SET LEADCHNL RV PACING AMPLITUDE: 4 V
MDC IDC SET LEADCHNL RV PACING PULSEWIDTH: 0.4 ms
MDC IDC SET LEADCHNL RV SENSING SENSITIVITY: 2.8 mV
MDC IDC STAT BRADY AS VS PERCENT: 5 %

## 2015-02-21 NOTE — Telephone Encounter (Signed)
Diltiazem ER 240mg  approved through 02/20/2016.

## 2015-03-07 ENCOUNTER — Ambulatory Visit (INDEPENDENT_AMBULATORY_CARE_PROVIDER_SITE_OTHER): Payer: Medicare Other | Admitting: Podiatry

## 2015-03-07 ENCOUNTER — Encounter: Payer: Self-pay | Admitting: Podiatry

## 2015-03-07 DIAGNOSIS — M79674 Pain in right toe(s): Secondary | ICD-10-CM

## 2015-03-07 DIAGNOSIS — M79675 Pain in left toe(s): Secondary | ICD-10-CM | POA: Diagnosis not present

## 2015-03-07 DIAGNOSIS — B351 Tinea unguium: Secondary | ICD-10-CM | POA: Diagnosis not present

## 2015-03-07 NOTE — Patient Instructions (Signed)
Diabetes and Foot Care Diabetes may cause you to have problems because of poor blood supply (circulation) to your feet and legs. This may cause the skin on your feet to become thinner, break easier, and heal more slowly. Your skin may become dry, and the skin may peel and crack. You may also have nerve damage in your legs and feet causing decreased feeling in them. You may not notice minor injuries to your feet that could lead to infections or more serious problems. Taking care of your feet is one of the most important things you can do for yourself.  HOME CARE INSTRUCTIONS  Wear shoes at all times, even in the house. Do not go barefoot. Bare feet are easily injured.  Check your feet daily for blisters, cuts, and redness. If you cannot see the bottom of your feet, use a mirror or ask someone for help.  Wash your feet with warm water (do not use hot water) and mild soap. Then pat your feet and the areas between your toes until they are completely dry. Do not soak your feet as this can dry your skin.  Apply a moisturizing lotion or petroleum jelly (that does not contain alcohol and is unscented) to the skin on your feet and to dry, brittle toenails. Do not apply lotion between your toes.  Trim your toenails straight across. Do not dig under them or around the cuticle. File the edges of your nails with an emery board or nail file.  Do not cut corns or calluses or try to remove them with medicine.  Wear clean socks or stockings every day. Make sure they are not too tight. Do not wear knee-high stockings since they may decrease blood flow to your legs.  Wear shoes that fit properly and have enough cushioning. To break in new shoes, wear them for just a few hours a day. This prevents you from injuring your feet. Always look in your shoes before you put them on to be sure there are no objects inside.  Do not cross your legs. This may decrease the blood flow to your feet.  If you find a minor scrape,  cut, or break in the skin on your feet, keep it and the skin around it clean and dry. These areas may be cleansed with mild soap and water. Do not cleanse the area with peroxide, alcohol, or iodine.  When you remove an adhesive bandage, be sure not to damage the skin around it.  If you have a wound, look at it several times a day to make sure it is healing.  Do not use heating pads or hot water bottles. They may burn your skin. If you have lost feeling in your feet or legs, you may not know it is happening until it is too late.  Make sure your health care provider performs a complete foot exam at least annually or more often if you have foot problems. Report any cuts, sores, or bruises to your health care provider immediately. SEEK MEDICAL CARE IF:   You have an injury that is not healing.  You have cuts or breaks in the skin.  You have an ingrown nail.  You notice redness on your legs or feet.  You feel burning or tingling in your legs or feet.  You have pain or cramps in your legs and feet.  Your legs or feet are numb.  Your feet always feel cold. SEEK IMMEDIATE MEDICAL CARE IF:   There is increasing redness,   swelling, or pain in or around a wound.  There is a red line that goes up your leg.  Pus is coming from a wound.  You develop a fever or as directed by your health care provider.  You notice a bad smell coming from an ulcer or wound.   This information is not intended to replace advice given to you by your health care provider. Make sure you discuss any questions you have with your health care provider.   Document Released: 01/12/2000 Document Revised: 09/16/2012 Document Reviewed: 06/23/2012 Elsevier Interactive Patient Education 2016 Elsevier Inc.  

## 2015-03-07 NOTE — Progress Notes (Signed)
Patient ID: LENESHA WAXMAN, female   DOB: 04-01-1932, 82 y.o.   MRN: [id number]  Subjective: This patient presents for scheduled visit complaining of thickened and elongated toenails which are uncomfortable walking wearing shoes and request toenail debridement. Also, patient states that she is prediabetic and denies taking any medication for diabetes. She is requesting a replacement of her diabetic shoes that she states she corn for approximately 2 years  Patient denies any history of foot ulceration, claudication or amputation  Orientated 3  Vascular: Mild nonpitting edema bilaterally DP and PT pulses 2/4 bilaterally Reflex immediate bilaterally  Neurological: Sensation to 10 g monofilament wire intact 5/5 bilaterally Vibratory sensation reactive bilaterally Ankle reflex equal and reactive bilaterally  Dermatological: No skin lesions bilaterally No open wounds bilaterally The toenails are elongated, brittle, deformed, discolored and tender to direct palpation 6-10  Musculoskeletal: HAV deformities bilaterally  Assessment: History of prediabetes Satisfactory neurovascular status Symptomatic onychomycoses 6-10  Plan: Debrided toenails 6-10 mechanically and electrically without a bleeding Patient has scheduled visit with Dr. Kenton Kingfisher in the next month and will determine if patient will qualify for diabetic shoes  Reappoint 3 months

## 2015-03-08 ENCOUNTER — Encounter: Payer: Self-pay | Admitting: Interventional Cardiology

## 2015-03-08 ENCOUNTER — Ambulatory Visit (INDEPENDENT_AMBULATORY_CARE_PROVIDER_SITE_OTHER): Payer: Medicare Other | Admitting: Interventional Cardiology

## 2015-03-08 ENCOUNTER — Ambulatory Visit (INDEPENDENT_AMBULATORY_CARE_PROVIDER_SITE_OTHER): Payer: Medicare Other | Admitting: *Deleted

## 2015-03-08 VITALS — BP 140/82 | HR 72 | Ht 62.0 in | Wt 200.8 lb

## 2015-03-08 DIAGNOSIS — I480 Paroxysmal atrial fibrillation: Secondary | ICD-10-CM

## 2015-03-08 DIAGNOSIS — G459 Transient cerebral ischemic attack, unspecified: Secondary | ICD-10-CM

## 2015-03-08 DIAGNOSIS — I4891 Unspecified atrial fibrillation: Secondary | ICD-10-CM

## 2015-03-08 DIAGNOSIS — I5032 Chronic diastolic (congestive) heart failure: Secondary | ICD-10-CM | POA: Diagnosis not present

## 2015-03-08 DIAGNOSIS — I10 Essential (primary) hypertension: Secondary | ICD-10-CM | POA: Diagnosis not present

## 2015-03-08 DIAGNOSIS — Z950 Presence of cardiac pacemaker: Secondary | ICD-10-CM

## 2015-03-08 DIAGNOSIS — Z5181 Encounter for therapeutic drug level monitoring: Secondary | ICD-10-CM

## 2015-03-08 DIAGNOSIS — Z7901 Long term (current) use of anticoagulants: Secondary | ICD-10-CM

## 2015-03-08 LAB — POCT INR: INR: 2

## 2015-03-08 NOTE — Patient Instructions (Signed)
Medication Instructions:  Your physician recommends that you continue on your current medications as directed. Please refer to the Current Medication list given to you today.   Labwork: none  Testing/Procedures: none  Follow-Up: Your physician wants you to follow-up in: 9 months with Dr. Tamala Julian. You will receive a reminder letter in the mail two months in advance. If you don't receive a letter, please call our office to schedule the follow-up appointment.   Any Other Special Instructions Will Be Listed Below (If Applicable).     If you need a refill on your cardiac medications before your next appointment, please call your pharmacy.

## 2015-03-08 NOTE — Progress Notes (Signed)
Cardiology Office Note   Date:  03/08/2015   ID:  HONESTII RICHENS, DOB [birth year], MRN [id number]  PCP:  Shirline Frees, MD  Cardiologist:  Sinclair Grooms, MD   Chief Complaint  Patient presents with  . Hypertension  . Atrial Fibrillation      History of Present Illness: [name] is a 82 y.o. female who presents for left ventricular hypertrophy, hypertension, persistent atrial fibrillation, diastolic heart failure, tachybradycardia syndrome, permanent pacemaker.  Recent pacemaker interrogation one month ago demonstrated persistent atrial fibrillation. The device was changed to a mode compatible with atrial fibrillation. Since reprogramming the patient states there is more fatigue and difficulty getting around. No chest discomfort, orthopnea, or PND.    Past Medical History  Diagnosis Date  . Depression   . Coronary atherosclerosis of native coronary artery   . Venous insufficiency   . Tachy-brady syndrome ([place])     With permanent pacemaker 04/03/99  . PAF (paroxysmal atrial fibrillation) (HCC)     takes Coumadin daily  . Lichen planus   . Hypertension     takes Amlodipine and Diovan daily  . Chronic diastolic CHF (congestive heart failure) (HCC)     takes Bumex daily  . Anxiety     takes Valium daily as needed  . Muscle spasm     takes Celebrex daily and Robaxin daily as needed  . Heart murmur   . TIA (transient ischemic attack)   . Pacemaker   . Pneumonia     as a child  . Dizziness     occasionally  . Joint pain   . Joint swelling   . Chronic back pain     stenosis  . History of gout     doesn't take any meds  . History of gastric ulcer   . History of colon polyps   . Diverticulosis   . Glaucoma     uses eye drops daily  . Chronic bronchitis ([place])     "get it practically q yr"   . Tuberculosis     during childhood unsure of time  . Type II diabetes mellitus ([place])     but doesn't take any meds (10/19/2013)  . Stroke [facility]) 1990's   "found it in ~ 1997-1998; never even knew I'd had one"  . Osteoarthritis   . Arthritis     "joints"  . GERD (gastroesophageal reflux disease)     Past Surgical History  Procedure Laterality Date  . Tonsillectomy    . Tubal ligation  1971  . Cataract extraction w/ intraocular lens  implant, bilateral Bilateral ?2005  . Back surgery    . Lumbar disc surgery  08/2006  . Cardiac catheterization  X 2  . Colonoscopy    . Esophagogastroduodenoscopy    . Total knee arthroplasty Right 10/19/2013  . Abdominal hysterectomy      w/BSO  . Insert / replace / remove pacemaker  04/03/99; ?2008  . Total knee arthroplasty Right 10/19/2013    Procedure: TOTAL KNEE ARTHROPLASTY;  Surgeon: Hessie Dibble, MD;  Location: [facility];  Service: Orthopedics;  Laterality: Right;     Current Outpatient Prescriptions  Medication Sig Dispense Refill  . Acetaminophen (TYLENOL ARTHRITIS PAIN PO) Take 1 tablet by mouth every 6 (six) hours as needed (arthritis).    Marland Kitchen b complex vitamins tablet Take 1 tablet by mouth daily.    . beta carotene 25000 UNIT capsule Take 25,000 Units by mouth 3 (three) times a  week.     . bimatoprost (LUMIGAN) 0.01 % SOLN Place 1 drop into both eyes at bedtime.    . bumetanide (BUMEX) 1 MG tablet Take 1.5 tablets (1.5 mg total) by mouth daily. 45 tablet 5  . diazepam (VALIUM) 5 MG tablet Take 5 mg by mouth daily as needed for anxiety.     Marland Kitchen diltiazem (CARDIZEM CD) 240 MG 24 hr capsule Take 1 capsule (240 mg total) by mouth daily. 30 capsule 5  . Elderberry 575 MG/5ML SYRP Take 575 mg by mouth daily as needed (cold symptoms).     . Glycerin-Polysorbate 80 (REFRESH DRY EYE THERAPY OP) Apply 1 drop to eye daily as needed (dry eyes).    Marland Kitchen lidocaine (LIDODERM) 5 % Place 1 patch onto the skin daily as needed (pain).     . methocarbamol (ROBAXIN) 500 MG tablet Take 1 tablet (500 mg total) by mouth every 6 (six) hours as needed for muscle spasms. 50 tablet 0  . metoprolol succinate (TOPROL XL) 25 MG  24 hr tablet Take 1 tablet (25 mg total) by mouth daily. 30 tablet 11  . Multiple Vitamin (MULTIVITAMIN WITH MINERALS) TABS Take 1 tablet by mouth daily.    Marland Kitchen nystatin (MYCOSTATIN) 100000 UNIT/ML suspension Take 500,000 Units by mouth daily as needed (for mouth irritation).     Marland Kitchen OVER THE COUNTER MEDICATION Apply 1 application topically 2 (two) times daily as needed (TOENAIL FUNGUS). Med Name: "Formula 3"    . potassium chloride SA (K-DUR,KLOR-CON) 20 MEQ tablet Take 1 tablet (20 mEq total) by mouth daily. 90 tablet 3  . senna (SENOKOT) 8.6 MG tablet Take 2 tablets by mouth at bedtime.     . valsartan (DIOVAN) 320 MG tablet Take 1 tablet (320 mg total) by mouth daily. 30 tablet 10  . vitamin C (ASCORBIC ACID) 500 MG tablet Take 500 mg by mouth daily as needed (COLD SYMPTOMS).     Marland Kitchen warfarin (COUMADIN) 5 MG tablet Take as directed by coumadin clinic 120 tablet 1   No current facility-administered medications for this visit.    Allergies:   Procardia; Lasix; Levsin; and Prednisone    Social History:  The patient  reports that she has never smoked. She has never used smokeless tobacco. She reports that she does not drink alcohol or use illicit drugs.   Family History:  The patient's family history includes Cancer in her mother; Diabetes in her sister; Heart disease in her father; Heart failure in her sister; Hypertension in her father and mother. There is no history of Malignant hyperthermia.    ROS:  Please see the history of present illness.   Otherwise, review of systems are positive for intermittent fatigue, dyspnea, and decreased energy..   All other systems are reviewed and negative.    PHYSICAL EXAM: VS:  BP 140/82 mmHg  Pulse 72  Ht 5\' 2"  (1.575 m)  Wt 200 lb 12.8 oz (91.082 kg)  BMI 36.72 kg/m2 , BMI Body mass index is 36.72 kg/(m^2). GEN: Well nourished, well developed, in no acute distress HEENT: normal Neck: no JVD, carotid bruits, or masses Cardiac: IRR.  There is no  murmur, rub, or gallop. There is no edema. Respiratory:  clear to auscultation bilaterally, normal work of breathing. GI: soft, nontender, nondistended, + BS MS: no deformity or atrophy Skin: warm and dry, no rash Neuro:  Strength and sensation are intact Psych: euthymic mood, full affect   EKG:  EKG is not ordered today.  Recent Labs: 09/06/2014: BUN 15; Creatinine, Ser 0.99; Potassium 4.0; Sodium 140    Lipid Panel No results found for: CHOL, TRIG, HDL, CHOLHDL, VLDL, LDLCALC, LDLDIRECT    Wt Readings from Last 3 Encounters:  03/08/15 200 lb 12.8 oz (91.082 kg)  02/15/15 204 lb (92.534 kg)  09/06/14 204 lb 12.8 oz (92.897 kg)      Other studies Reviewed: Additional studies/ records that were reviewed today include: the recent pacemaker evaluation was reviewed.. The findings include neck continuous atrial fibrillation was identified..    ASSESSMENT AND PLAN:  1. Paroxysmal a-fib (HCC) Atrial fibrillation is persistent  2. Chronic diastolic heart failure (HCC) No evidence of volume overload  3. Transient cerebral ischemia, unspecified transient cerebral ischemia type No recurrent episodes  4. Essential hypertension Control blood pressure  5. Long term current use of anticoagulant therapy Followed in Coumadin clinic  6. Pacemaker Say she feels weaker since pacemaker reprogramming. [place] with Dr. Lovena Le about this.    Current medicines are reviewed at length with the patient today.  The patient has the following concerns regarding medicines: none.  The following changes/actions have been instituted:    The patient feels that her energy level has been diminished since reprogramming in January.  Labs/ tests ordered today include:  No orders of the defined types were placed in this encounter.     Disposition:   FU with HS in 8 months  Signed, Sinclair Grooms, MD  03/08/2015 3:00 PM    [address], [place],  [place]  [address] Phone: [phone]; Fax: [phone]

## 2015-03-14 ENCOUNTER — Telehealth: Payer: Self-pay

## 2015-03-14 NOTE — Telephone Encounter (Signed)
Pt called and given Dr. Thompson Caul response r/t to her device changes.  Pt verbalized understanding, no questions at this time.   Serita Kyle C >','<< Less Detail',event)" href="javascript:;">More Detail >>   Belva Crome, MD   Sent: Thu March 09, 2015 9:57 AM    To: Newt Minion, RN        Message     Let her know I spoke to EP and reprogramming not causing her complaints.    For now, observation. F/u with [place] if gets worse.    ----- Message -----     From: Evans Lance, MD     Sent: 03/08/2015  9:14 PM      To: Belva Crome, MD        None that I can think off unless she has somehow gone back to NSR and also had complete heart block. All we did was to keep the atrial lead from inappropriately pacing the atrium.     ----- Message -----     From: Belva Crome, MD     Sent: 03/08/2015  6:26 PM      To: Evans Lance, MD        Carleene Overlie,    Mrs. Kluge complains of feeling fatigue since her pacemaker reprogramming in January. I looked over the notes and is sounds as though she is in chronic atrial fibrillation and the only reprogramming done was to change the mode of pacing to be more appropriate for A. Fib.        Is there any reason that this could cause her to feel more fatigued?        HS

## 2015-03-21 ENCOUNTER — Other Ambulatory Visit: Payer: Self-pay | Admitting: *Deleted

## 2015-03-21 MED ORDER — WARFARIN SODIUM 5 MG PO TABS: ORAL_TABLET | ORAL | Status: DC

## 2015-03-24 DIAGNOSIS — E119 Type 2 diabetes mellitus without complications: Secondary | ICD-10-CM | POA: Diagnosis not present

## 2015-03-24 DIAGNOSIS — M1990 Unspecified osteoarthritis, unspecified site: Secondary | ICD-10-CM | POA: Diagnosis not present

## 2015-03-24 DIAGNOSIS — I481 Persistent atrial fibrillation: Secondary | ICD-10-CM | POA: Diagnosis not present

## 2015-03-24 DIAGNOSIS — F419 Anxiety disorder, unspecified: Secondary | ICD-10-CM | POA: Diagnosis not present

## 2015-03-24 DIAGNOSIS — I10 Essential (primary) hypertension: Secondary | ICD-10-CM | POA: Diagnosis not present

## 2015-03-24 DIAGNOSIS — R600 Localized edema: Secondary | ICD-10-CM | POA: Diagnosis not present

## 2015-03-24 DIAGNOSIS — I2510 Atherosclerotic heart disease of native coronary artery without angina pectoris: Secondary | ICD-10-CM | POA: Diagnosis not present

## 2015-03-24 DIAGNOSIS — E7800 Pure hypercholesterolemia, unspecified: Secondary | ICD-10-CM | POA: Diagnosis not present

## 2015-03-27 ENCOUNTER — Encounter: Payer: Self-pay | Admitting: Interventional Cardiology

## 2015-03-31 ENCOUNTER — Other Ambulatory Visit: Payer: Self-pay | Admitting: *Deleted

## 2015-03-31 DIAGNOSIS — I5032 Chronic diastolic (congestive) heart failure: Secondary | ICD-10-CM

## 2015-03-31 MED ORDER — BUMETANIDE 1 MG PO TABS: 1.5000 mg | ORAL_TABLET | Freq: Every day | ORAL | Status: DC

## 2015-04-06 ENCOUNTER — Ambulatory Visit (INDEPENDENT_AMBULATORY_CARE_PROVIDER_SITE_OTHER): Payer: Medicare Other | Admitting: *Deleted

## 2015-04-06 DIAGNOSIS — Z5181 Encounter for therapeutic drug level monitoring: Secondary | ICD-10-CM | POA: Diagnosis not present

## 2015-04-06 DIAGNOSIS — I480 Paroxysmal atrial fibrillation: Secondary | ICD-10-CM

## 2015-04-06 DIAGNOSIS — G459 Transient cerebral ischemic attack, unspecified: Secondary | ICD-10-CM | POA: Diagnosis not present

## 2015-04-06 DIAGNOSIS — I4891 Unspecified atrial fibrillation: Secondary | ICD-10-CM | POA: Diagnosis not present

## 2015-04-06 LAB — POCT INR: INR: 2.3

## 2015-04-13 DIAGNOSIS — H15002 Unspecified scleritis, left eye: Secondary | ICD-10-CM | POA: Diagnosis not present

## 2015-04-13 DIAGNOSIS — H04123 Dry eye syndrome of bilateral lacrimal glands: Secondary | ICD-10-CM | POA: Diagnosis not present

## 2015-04-13 DIAGNOSIS — H26491 Other secondary cataract, right eye: Secondary | ICD-10-CM | POA: Diagnosis not present

## 2015-04-13 DIAGNOSIS — H401132 Primary open-angle glaucoma, bilateral, moderate stage: Secondary | ICD-10-CM | POA: Diagnosis not present

## 2015-05-03 ENCOUNTER — Ambulatory Visit (INDEPENDENT_AMBULATORY_CARE_PROVIDER_SITE_OTHER): Payer: Medicare Other | Admitting: *Deleted

## 2015-05-03 DIAGNOSIS — Z5181 Encounter for therapeutic drug level monitoring: Secondary | ICD-10-CM | POA: Diagnosis not present

## 2015-05-03 DIAGNOSIS — G459 Transient cerebral ischemic attack, unspecified: Secondary | ICD-10-CM

## 2015-05-03 DIAGNOSIS — I480 Paroxysmal atrial fibrillation: Secondary | ICD-10-CM | POA: Diagnosis not present

## 2015-05-03 DIAGNOSIS — I4891 Unspecified atrial fibrillation: Secondary | ICD-10-CM

## 2015-05-03 LAB — POCT INR: INR: 2.3

## 2015-05-16 DIAGNOSIS — S161XXA Strain of muscle, fascia and tendon at neck level, initial encounter: Secondary | ICD-10-CM | POA: Diagnosis not present

## 2015-05-16 DIAGNOSIS — N898 Other specified noninflammatory disorders of vagina: Secondary | ICD-10-CM | POA: Diagnosis not present

## 2015-05-16 DIAGNOSIS — Z1239 Encounter for other screening for malignant neoplasm of breast: Secondary | ICD-10-CM | POA: Diagnosis not present

## 2015-05-16 DIAGNOSIS — M5431 Sciatica, right side: Secondary | ICD-10-CM | POA: Diagnosis not present

## 2015-05-17 ENCOUNTER — Other Ambulatory Visit: Payer: Self-pay | Admitting: Family Medicine

## 2015-05-17 DIAGNOSIS — Z1231 Encounter for screening mammogram for malignant neoplasm of breast: Secondary | ICD-10-CM

## 2015-05-18 ENCOUNTER — Telehealth: Payer: Self-pay | Admitting: Interventional Cardiology

## 2015-05-18 NOTE — Telephone Encounter (Signed)
New message      Pt is on coumadin.  PCP prescribed prednisone 10mg  for 9 days and tizanidine HCL 2mg  1 tab at bedtime----for muscle spasm.  Will she need to have her coumadin adjusted?

## 2015-05-18 NOTE — Telephone Encounter (Signed)
Spoke with pt. And discussed new meds, Prednisone taper and sleeping pill. Made her aware that steroid could affect INR, thus appt made for Monday.

## 2015-05-22 ENCOUNTER — Ambulatory Visit (INDEPENDENT_AMBULATORY_CARE_PROVIDER_SITE_OTHER): Payer: Medicare Other

## 2015-05-22 DIAGNOSIS — Z5181 Encounter for therapeutic drug level monitoring: Secondary | ICD-10-CM | POA: Diagnosis not present

## 2015-05-22 DIAGNOSIS — I4891 Unspecified atrial fibrillation: Secondary | ICD-10-CM

## 2015-05-22 DIAGNOSIS — G459 Transient cerebral ischemic attack, unspecified: Secondary | ICD-10-CM

## 2015-05-22 DIAGNOSIS — I480 Paroxysmal atrial fibrillation: Secondary | ICD-10-CM

## 2015-05-22 LAB — POCT INR: INR: 2.3

## 2015-06-05 ENCOUNTER — Other Ambulatory Visit: Payer: Self-pay | Admitting: *Deleted

## 2015-06-05 MED ORDER — METOPROLOL SUCCINATE ER 25 MG PO TB24: 25.0000 mg | ORAL_TABLET | Freq: Every day | ORAL | Status: DC

## 2015-06-06 ENCOUNTER — Encounter: Payer: Self-pay | Admitting: Podiatry

## 2015-06-06 ENCOUNTER — Ambulatory Visit (INDEPENDENT_AMBULATORY_CARE_PROVIDER_SITE_OTHER): Payer: Medicare Other | Admitting: Podiatry

## 2015-06-06 DIAGNOSIS — B351 Tinea unguium: Secondary | ICD-10-CM | POA: Diagnosis not present

## 2015-06-06 DIAGNOSIS — M79674 Pain in right toe(s): Secondary | ICD-10-CM | POA: Diagnosis not present

## 2015-06-06 DIAGNOSIS — M79675 Pain in left toe(s): Secondary | ICD-10-CM | POA: Diagnosis not present

## 2015-06-06 NOTE — Patient Instructions (Signed)
If the itching continues in between your left big toe and second toe purchase over-the-counter clotrimazole 1% cream and rub in between the toes once daily 30 days  Diabetes and Foot Care Diabetes may cause you to have problems because of poor blood supply (circulation) to your feet and legs. This may cause the skin on your feet to become thinner, break easier, and heal more slowly. Your skin may become dry, and the skin may peel and crack. You may also have nerve damage in your legs and feet causing decreased feeling in them. You may not notice minor injuries to your feet that could lead to infections or more serious problems. Taking care of your feet is one of the most important things you can do for yourself.  HOME CARE INSTRUCTIONS  Wear shoes at all times, even in the house. Do not go barefoot. Bare feet are easily injured.  Check your feet daily for blisters, cuts, and redness. If you cannot see the bottom of your feet, use a mirror or ask someone for help.  Wash your feet with warm water (do not use hot water) and mild soap. Then pat your feet and the areas between your toes until they are completely dry. Do not soak your feet as this can dry your skin.  Apply a moisturizing lotion or petroleum jelly (that does not contain alcohol and is unscented) to the skin on your feet and to dry, brittle toenails. Do not apply lotion between your toes.  Trim your toenails straight across. Do not dig under them or around the cuticle. File the edges of your nails with an emery board or nail file.  Do not cut corns or calluses or try to remove them with medicine.  Wear clean socks or stockings every day. Make sure they are not too tight. Do not wear knee-high stockings since they may decrease blood flow to your legs.  Wear shoes that fit properly and have enough cushioning. To break in new shoes, wear them for just a few hours a day. This prevents you from injuring your feet. Always look in your shoes  before you put them on to be sure there are no objects inside.  Do not cross your legs. This may decrease the blood flow to your feet.  If you find a minor scrape, cut, or break in the skin on your feet, keep it and the skin around it clean and dry. These areas may be cleansed with mild soap and water. Do not cleanse the area with peroxide, alcohol, or iodine.  When you remove an adhesive bandage, be sure not to damage the skin around it.  If you have a wound, look at it several times a day to make sure it is healing.  Do not use heating pads or hot water bottles. They may burn your skin. If you have lost feeling in your feet or legs, you may not know it is happening until it is too late.  Make sure your health care provider performs a complete foot exam at least annually or more often if you have foot problems. Report any cuts, sores, or bruises to your health care provider immediately. SEEK MEDICAL CARE IF:   You have an injury that is not healing.  You have cuts or breaks in the skin.  You have an ingrown nail.  You notice redness on your legs or feet.  You feel burning or tingling in your legs or feet.  You have pain or cramps  in your legs and feet.  Your legs or feet are numb.  Your feet always feel cold. SEEK IMMEDIATE MEDICAL CARE IF:   There is increasing redness, swelling, or pain in or around a wound.  There is a red line that goes up your leg.  Pus is coming from a wound.  You develop a fever or as directed by your health care provider.  You notice a bad smell coming from an ulcer or wound.   This information is not intended to replace advice given to you by your health care provider. Make sure you discuss any questions you have with your health care provider.   Document Released: 01/12/2000 Document Revised: 09/16/2012 Document Reviewed: 06/23/2012 Elsevier Interactive Patient Education [employer].

## 2015-06-06 NOTE — Progress Notes (Signed)
Patient ID: [name], female   DOB: 02-17-1932, 82 y.o.   MRN: [id number]  Subjective: This patient presents for scheduled visit complaining of thickened and elongated toenails which are uncomfortable walking wearing shoes and request toenail debridement. Also, patient states that she is prediabetic and denies taking any medication for diabetes. She is requesting a replacement of her diabetic shoes that she states she corn for approximately 2 years. Patient complains of occasional itching in the first left web space today  Patient denies any history of foot ulceration, claudication or amputation  Orientated 3  Vascular: Mild nonpitting edema bilaterally DP and PT pulses 2/4 bilaterally Reflex immediate bilaterally  Neurological: Sensation to 10 g monofilament wire intact 5/5 bilaterally Vibratory sensation reactive bilaterally Ankle reflex equal and reactive bilaterally  Dermatological: No scaling or skin lesions noted in the first left web space No skin lesions bilaterally No open wounds bilaterally The toenails are elongated, brittle, deformed, discolored and tender to direct palpation 6-10  Musculoskeletal: HAV deformities bilaterally  Assessment: History of prediabetes Satisfactory neurovascular status Symptomatic onychomycoses 6-10 Itching first left web space  Plan: Debrided toenails 6-10 mechanically and electrically without a bleeding Patient has scheduled visit with Dr. Kenton Kingfisher in the next month and will determine if patient will qualify for diabetic shoes Advised patient that if the itching persisted to purchase over-the-counter clotrimazole 1% cream and rub into the first left web space was daily 30 days  Reappoint 3 months

## 2015-06-07 DIAGNOSIS — L723 Sebaceous cyst: Secondary | ICD-10-CM | POA: Diagnosis not present

## 2015-06-13 ENCOUNTER — Ambulatory Visit
Admission: RE | Admit: 2015-06-13 | Discharge: 2015-06-13 | Disposition: A | Discharge status: Home / Self Care | Payer: Medicare Other | Source: Ambulatory Visit | Attending: Family Medicine | Admitting: Family Medicine

## 2015-06-13 DIAGNOSIS — Z1231 Encounter for screening mammogram for malignant neoplasm of breast: Secondary | ICD-10-CM

## 2015-06-15 ENCOUNTER — Telehealth: Payer: Self-pay | Admitting: Internal Medicine
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
## 2015-06-15 NOTE — Telephone Encounter (Signed)
Call transferred to device clinic pt asked for Dr. Ezekiel Slocumb, poor signal quality unable to communicate with pt, call dropped. Will try to reach pt.
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
## 2015-06-16 ENCOUNTER — Ambulatory Visit (INDEPENDENT_AMBULATORY_CARE_PROVIDER_SITE_OTHER): Payer: Medicare Other | Admitting: *Deleted

## 2015-06-16 DIAGNOSIS — I482 Chronic atrial fibrillation: Secondary | ICD-10-CM | POA: Diagnosis not present

## 2015-06-16 DIAGNOSIS — I4821 Permanent atrial fibrillation: Secondary | ICD-10-CM

## 2015-06-16 DIAGNOSIS — I495 Sick sinus syndrome: Secondary | ICD-10-CM | POA: Diagnosis not present

## 2015-06-16 LAB — CUP PACEART INCLINIC DEVICE CHECK
Battery Remaining Longevity: 22 mo
Brady Statistic AP VP Percent: 0 %
Brady Statistic AS VS Percent: 99 %
Date Time Interrogation Session: 20170519093228
Implantable Lead Implant Date: 20010306
Implantable Lead Location: 753860
Implantable Lead Model: 5092
Lead Channel Pacing Threshold Amplitude: 1.75 V
Lead Channel Pacing Threshold Pulse Width: 0.4 ms
Lead Channel Pacing Threshold Pulse Width: 0.76 ms
Lead Channel Setting Pacing Amplitude: 2 V
Lead Channel Setting Pacing Amplitude: 3 V
Lead Channel Setting Pacing Pulse Width: 0.76 ms
Lead Channel Setting Sensing Sensitivity: 2.8 mV
MDC IDC LEAD IMPLANT DT: 20010306
MDC IDC LEAD LOCATION: 753859
MDC IDC MSMT BATTERY IMPEDANCE: 1570 Ohm
MDC IDC MSMT BATTERY VOLTAGE: 2.73 V
MDC IDC MSMT LEADCHNL RA IMPEDANCE VALUE: 436 Ohm
MDC IDC MSMT LEADCHNL RA SENSING INTR AMPL: 0.5 mV
MDC IDC MSMT LEADCHNL RV IMPEDANCE VALUE: 604 Ohm
MDC IDC MSMT LEADCHNL RV PACING THRESHOLD AMPLITUDE: 1.5 V
MDC IDC MSMT LEADCHNL RV SENSING INTR AMPL: 5.6 mV
MDC IDC STAT BRADY AP VS PERCENT: 0 %
MDC IDC STAT BRADY AS VP PERCENT: 1 %

## 2015-06-16 NOTE — Progress Notes (Signed)
Pacemaker check in clinic. Normal device function. Threshold, sensing, impedances consistent with previous measurements. Device programmed to maximize longevity. Permanent AF + warfarin. (517) high ventricular rates noted--max dur. 11 mins, Max Avg V 162--AF/RVR likely per available EGMs. Device programmed at appropriate safety margins. Histogram distribution appropriate for patient activity level. Device programmed to optimize intrinsic conduction. Estimated longevity 24 months. Patient will follow up with [facility] in 12-2015.

## 2015-06-19 DIAGNOSIS — M542 Cervicalgia: Secondary | ICD-10-CM | POA: Diagnosis not present

## 2015-06-21 ENCOUNTER — Ambulatory Visit (INDEPENDENT_AMBULATORY_CARE_PROVIDER_SITE_OTHER): Payer: Medicare Other | Admitting: Surgery

## 2015-06-21 DIAGNOSIS — G459 Transient cerebral ischemic attack, unspecified: Secondary | ICD-10-CM | POA: Diagnosis not present

## 2015-06-21 DIAGNOSIS — I4891 Unspecified atrial fibrillation: Secondary | ICD-10-CM

## 2015-06-21 DIAGNOSIS — I480 Paroxysmal atrial fibrillation: Secondary | ICD-10-CM | POA: Diagnosis not present

## 2015-06-21 DIAGNOSIS — Z5181 Encounter for therapeutic drug level monitoring: Secondary | ICD-10-CM

## 2015-06-21 LAB — POCT INR: INR: 2.2

## 2015-07-03 ENCOUNTER — Encounter: Payer: Self-pay | Admitting: Internal Medicine

## 2015-07-04 DIAGNOSIS — M47892 Other spondylosis, cervical region: Secondary | ICD-10-CM | POA: Diagnosis not present

## 2015-07-04 DIAGNOSIS — M542 Cervicalgia: Secondary | ICD-10-CM | POA: Diagnosis not present

## 2015-07-05 DIAGNOSIS — M542 Cervicalgia: Secondary | ICD-10-CM | POA: Diagnosis not present

## 2015-07-05 DIAGNOSIS — M47892 Other spondylosis, cervical region: Secondary | ICD-10-CM | POA: Diagnosis not present

## 2015-07-10 DIAGNOSIS — L309 Dermatitis, unspecified: Secondary | ICD-10-CM | POA: Diagnosis not present

## 2015-07-12 DIAGNOSIS — M542 Cervicalgia: Secondary | ICD-10-CM | POA: Diagnosis not present

## 2015-07-12 DIAGNOSIS — M47892 Other spondylosis, cervical region: Secondary | ICD-10-CM | POA: Diagnosis not present

## 2015-07-12 DIAGNOSIS — L564 Polymorphous light eruption: Secondary | ICD-10-CM | POA: Diagnosis not present

## 2015-07-14 DIAGNOSIS — M47892 Other spondylosis, cervical region: Secondary | ICD-10-CM | POA: Diagnosis not present

## 2015-07-14 DIAGNOSIS — M542 Cervicalgia: Secondary | ICD-10-CM | POA: Diagnosis not present

## 2015-07-17 DIAGNOSIS — M47892 Other spondylosis, cervical region: Secondary | ICD-10-CM | POA: Diagnosis not present

## 2015-07-17 DIAGNOSIS — M25561 Pain in right knee: Secondary | ICD-10-CM | POA: Diagnosis not present

## 2015-07-17 DIAGNOSIS — M542 Cervicalgia: Secondary | ICD-10-CM | POA: Diagnosis not present

## 2015-07-19 DIAGNOSIS — M542 Cervicalgia: Secondary | ICD-10-CM | POA: Diagnosis not present

## 2015-07-19 DIAGNOSIS — M47892 Other spondylosis, cervical region: Secondary | ICD-10-CM | POA: Diagnosis not present

## 2015-07-27 DIAGNOSIS — M47892 Other spondylosis, cervical region: Secondary | ICD-10-CM | POA: Diagnosis not present

## 2015-07-27 DIAGNOSIS — M542 Cervicalgia: Secondary | ICD-10-CM | POA: Diagnosis not present

## 2015-07-31 DIAGNOSIS — M47892 Other spondylosis, cervical region: Secondary | ICD-10-CM | POA: Diagnosis not present

## 2015-07-31 DIAGNOSIS — M542 Cervicalgia: Secondary | ICD-10-CM | POA: Diagnosis not present

## 2015-08-02 DIAGNOSIS — M47892 Other spondylosis, cervical region: Secondary | ICD-10-CM | POA: Diagnosis not present

## 2015-08-02 DIAGNOSIS — M542 Cervicalgia: Secondary | ICD-10-CM | POA: Diagnosis not present

## 2015-08-04 ENCOUNTER — Other Ambulatory Visit: Payer: Self-pay | Admitting: *Deleted

## 2015-08-04 MED ORDER — DILTIAZEM HCL ER COATED BEADS 240 MG PO CP24: 240.0000 mg | ORAL_CAPSULE | Freq: Every day | ORAL | Status: DC

## 2015-08-08 DIAGNOSIS — M25561 Pain in right knee: Secondary | ICD-10-CM | POA: Diagnosis not present

## 2015-08-08 DIAGNOSIS — Z96651 Presence of right artificial knee joint: Secondary | ICD-10-CM | POA: Diagnosis not present

## 2015-08-11 ENCOUNTER — Ambulatory Visit (INDEPENDENT_AMBULATORY_CARE_PROVIDER_SITE_OTHER): Payer: Medicare Other | Admitting: *Deleted

## 2015-08-11 DIAGNOSIS — Z96651 Presence of right artificial knee joint: Secondary | ICD-10-CM | POA: Diagnosis not present

## 2015-08-11 DIAGNOSIS — I480 Paroxysmal atrial fibrillation: Secondary | ICD-10-CM

## 2015-08-11 DIAGNOSIS — Z5181 Encounter for therapeutic drug level monitoring: Secondary | ICD-10-CM

## 2015-08-11 DIAGNOSIS — I4891 Unspecified atrial fibrillation: Secondary | ICD-10-CM

## 2015-08-11 DIAGNOSIS — G459 Transient cerebral ischemic attack, unspecified: Secondary | ICD-10-CM | POA: Diagnosis not present

## 2015-08-11 DIAGNOSIS — M25561 Pain in right knee: Secondary | ICD-10-CM | POA: Diagnosis not present

## 2015-08-11 LAB — POCT INR: INR: 1.9

## 2015-08-15 DIAGNOSIS — M25561 Pain in right knee: Secondary | ICD-10-CM | POA: Diagnosis not present

## 2015-08-15 DIAGNOSIS — Z96651 Presence of right artificial knee joint: Secondary | ICD-10-CM | POA: Diagnosis not present

## 2015-08-16 ENCOUNTER — Other Ambulatory Visit: Payer: Self-pay | Admitting: *Deleted

## 2015-08-16 MED ORDER — WARFARIN SODIUM 5 MG PO TABS: ORAL_TABLET | ORAL | Status: DC

## 2015-08-22 DIAGNOSIS — Z96651 Presence of right artificial knee joint: Secondary | ICD-10-CM | POA: Diagnosis not present

## 2015-08-22 DIAGNOSIS — M25561 Pain in right knee: Secondary | ICD-10-CM | POA: Diagnosis not present

## 2015-08-23 ENCOUNTER — Ambulatory Visit (INDEPENDENT_AMBULATORY_CARE_PROVIDER_SITE_OTHER): Payer: Medicare Other | Admitting: *Deleted

## 2015-08-23 DIAGNOSIS — I480 Paroxysmal atrial fibrillation: Secondary | ICD-10-CM

## 2015-08-23 DIAGNOSIS — I4891 Unspecified atrial fibrillation: Secondary | ICD-10-CM

## 2015-08-23 DIAGNOSIS — Z5181 Encounter for therapeutic drug level monitoring: Secondary | ICD-10-CM

## 2015-08-23 DIAGNOSIS — G459 Transient cerebral ischemic attack, unspecified: Secondary | ICD-10-CM

## 2015-08-23 LAB — POCT INR: INR: 3.1

## 2015-08-24 DIAGNOSIS — M25561 Pain in right knee: Secondary | ICD-10-CM | POA: Diagnosis not present

## 2015-08-24 DIAGNOSIS — Z96651 Presence of right artificial knee joint: Secondary | ICD-10-CM | POA: Diagnosis not present

## 2015-08-28 DIAGNOSIS — M67911 Unspecified disorder of synovium and tendon, right shoulder: Secondary | ICD-10-CM | POA: Diagnosis not present

## 2015-08-29 DIAGNOSIS — Z96651 Presence of right artificial knee joint: Secondary | ICD-10-CM | POA: Diagnosis not present

## 2015-08-29 DIAGNOSIS — M25561 Pain in right knee: Secondary | ICD-10-CM | POA: Diagnosis not present

## 2015-08-31 DIAGNOSIS — Z96651 Presence of right artificial knee joint: Secondary | ICD-10-CM | POA: Diagnosis not present

## 2015-08-31 DIAGNOSIS — M25561 Pain in right knee: Secondary | ICD-10-CM | POA: Diagnosis not present

## 2015-09-06 ENCOUNTER — Ambulatory Visit (INDEPENDENT_AMBULATORY_CARE_PROVIDER_SITE_OTHER): Payer: Medicare Other | Admitting: Pharmacist

## 2015-09-06 ENCOUNTER — Telehealth: Payer: Self-pay | Admitting: Pharmacist

## 2015-09-06 DIAGNOSIS — I480 Paroxysmal atrial fibrillation: Secondary | ICD-10-CM

## 2015-09-06 DIAGNOSIS — Z5181 Encounter for therapeutic drug level monitoring: Secondary | ICD-10-CM

## 2015-09-06 DIAGNOSIS — I4891 Unspecified atrial fibrillation: Secondary | ICD-10-CM | POA: Diagnosis not present

## 2015-09-06 LAB — POCT INR: INR: 3

## 2015-09-06 NOTE — Telephone Encounter (Signed)
Received fax from [facility] that pt is having 3 dental extractions by Dr.  Salon on August 25th. Request to check INR the day before and fax results to them at [phone] (phone [phone]). Called office to confirm they are comfortable proceeding with dental work if patient remains on Coumadin. She is at high cardiac risk given her history of stroke/TIA, HTN, HF, age > 19, and afib (CHADS2 score of 5). They confirmed that this will be fine.   Will have pt take half her usual Coumadin dose the night before she sees [place] for INR check to ensure INR is in the lower end of range (has been ~3 the past few checks).  Faxing clearance to [employer].

## 2015-09-12 ENCOUNTER — Encounter: Payer: Self-pay | Admitting: Podiatry

## 2015-09-12 ENCOUNTER — Ambulatory Visit (INDEPENDENT_AMBULATORY_CARE_PROVIDER_SITE_OTHER): Payer: Medicare Other | Admitting: Podiatry

## 2015-09-12 DIAGNOSIS — M79675 Pain in left toe(s): Secondary | ICD-10-CM

## 2015-09-12 DIAGNOSIS — B351 Tinea unguium: Secondary | ICD-10-CM | POA: Diagnosis not present

## 2015-09-12 DIAGNOSIS — M79674 Pain in right toe(s): Secondary | ICD-10-CM | POA: Diagnosis not present

## 2015-09-12 NOTE — Progress Notes (Signed)
Patient ID: [name], female   DOB: December 11, 1932, 82 y.o.   MRN: [id number]    Subjective: This patient presents for scheduled visit complaining of thickened and elongated toenails which are uncomfortable walking wearing shoes and request toenail debridement. Also, patient states that she is prediabetic and denies taking any medication for diabetes. She is requesting a replacement of her diabetic shoes that she states she corn for approximately 2 years. Patient complains of occasional itching in the first left web space today Patient denies any history of foot ulceration, claudication or amputation Today patient states that she is relocating to [place] in the next 30 days   Orientated 3  Vascular: Mild nonpitting edema bilaterally DP and PT pulses 2/4 bilaterally Reflex immediate bilaterally  Neurological: Sensation to 10 g monofilament wire intact 5/5 bilaterally Vibratory sensation reactive bilaterally Ankle reflex equal and reactive bilaterally  Dermatological: No scaling or skin lesions noted in the first left web space No skin lesions bilaterally No open wounds bilaterally The toenails are elongated, brittle, deformed, discolored and tender to direct palpation 6-10  Musculoskeletal: HAV deformities bilaterally  Assessment: History of prediabetes Satisfactory neurovascular status Symptomatic onychomycoses 6-10   Plan: Debrided toenails 6-10 mechanically and electrically without any bleeding  Patient relocating to [facility] next 30 days and did not reschedule patient for follow-up visit

## 2015-09-12 NOTE — Patient Instructions (Addendum)
Because you're moving to [facility] in the next 30 days I am not rescheduling you for a follow-up visit  Diabetes and Foot Care Diabetes may cause you to have problems because of poor blood supply (circulation) to your feet and legs. This may cause the skin on your feet to become thinner, break easier, and heal more slowly. Your skin may become dry, and the skin may peel and crack. You may also have nerve damage in your legs and feet causing decreased feeling in them. You may not notice minor injuries to your feet that could lead to infections or more serious problems. Taking care of your feet is one of the most important things you can do for yourself.  HOME CARE INSTRUCTIONS  Wear shoes at all times, even in the house. Do not go barefoot. Bare feet are easily injured.  Check your feet daily for blisters, cuts, and redness. If you cannot see the bottom of your feet, use a mirror or ask someone for help.  Wash your feet with warm water (do not use hot water) and mild soap. Then pat your feet and the areas between your toes until they are completely dry. Do not soak your feet as this can dry your skin.  Apply a moisturizing lotion or petroleum jelly (that does not contain alcohol and is unscented) to the skin on your feet and to dry, brittle toenails. Do not apply lotion between your toes.  Trim your toenails straight across. Do not dig under them or around the cuticle. File the edges of your nails with an emery board or nail file.  Do not cut corns or calluses or try to remove them with medicine.  Wear clean socks or stockings every day. Make sure they are not too tight. Do not wear knee-high stockings since they may decrease blood flow to your legs.  Wear shoes that fit properly and have enough cushioning. To break in new shoes, wear them for just a few hours a day. This prevents you from injuring your feet. Always look in your shoes before you put them on to be sure there are no  objects inside.  Do not cross your legs. This may decrease the blood flow to your feet.  If you find a minor scrape, cut, or break in the skin on your feet, keep it and the skin around it clean and dry. These areas may be cleansed with mild soap and water. Do not cleanse the area with peroxide, alcohol, or iodine.  When you remove an adhesive bandage, be sure not to damage the skin around it.  If you have a wound, look at it several times a day to make sure it is healing.  Do not use heating pads or hot water bottles. They may burn your skin. If you have lost feeling in your feet or legs, you may not know it is happening until it is too late.  Make sure your health care provider performs a complete foot exam at least annually or more often if you have foot problems. Report any cuts, sores, or bruises to your health care provider immediately. SEEK MEDICAL CARE IF:   You have an injury that is not healing.  You have cuts or breaks in the skin.  You have an ingrown nail.  You notice redness on your legs or feet.  You feel burning or tingling in your legs or feet.  You have pain or cramps in your legs and feet.  Your  legs or feet are numb.  Your feet always feel cold. SEEK IMMEDIATE MEDICAL CARE IF:   There is increasing redness, swelling, or pain in or around a wound.  There is a red line that goes up your leg.  Pus is coming from a wound.  You develop a fever or as directed by your health care provider.  You notice a bad smell coming from an ulcer or wound.   This information is not intended to replace advice given to you by your health care provider. Make sure you discuss any questions you have with your health care provider.   Document Released: 01/12/2000 Document Revised: 09/16/2012 Document Reviewed: 06/23/2012 Elsevier Interactive Patient Education [employer].

## 2015-09-19 ENCOUNTER — Ambulatory Visit (INDEPENDENT_AMBULATORY_CARE_PROVIDER_SITE_OTHER): Payer: Medicare Other | Admitting: Interventional Cardiology

## 2015-09-19 ENCOUNTER — Encounter: Payer: Self-pay | Admitting: Interventional Cardiology

## 2015-09-19 VITALS — BP 144/80 | HR 79 | Ht 62.0 in | Wt 203.4 lb

## 2015-09-19 DIAGNOSIS — I5032 Chronic diastolic (congestive) heart failure: Secondary | ICD-10-CM | POA: Diagnosis not present

## 2015-09-19 DIAGNOSIS — I480 Paroxysmal atrial fibrillation: Secondary | ICD-10-CM | POA: Diagnosis not present

## 2015-09-19 DIAGNOSIS — Z5181 Encounter for therapeutic drug level monitoring: Secondary | ICD-10-CM

## 2015-09-19 DIAGNOSIS — G458 Other transient cerebral ischemic attacks and related syndromes: Secondary | ICD-10-CM

## 2015-09-19 DIAGNOSIS — I10 Essential (primary) hypertension: Secondary | ICD-10-CM

## 2015-09-19 DIAGNOSIS — Z950 Presence of cardiac pacemaker: Secondary | ICD-10-CM

## 2015-09-19 NOTE — Patient Instructions (Signed)
Medication Instructions:  Your physician recommends that you continue on your current medications as directed. Please refer to the Current Medication list given to you today.   Labwork: None ordered  Testing/Procedures: None ordered  Follow-Up: Your physician recommends that you schedule a follow-up appointment in: AS NEEDED.  GOOD LUCK ON YOUR MOVE.  CALL us WITH INFORMATION AND WE WILL BE GLAD TO SEND YOUR RECORDS.   Any Other Special Instructions Will Be Listed Below (If Applicable).     If you need a refill on your cardiac medications before your next appointment, please call your pharmacy.

## 2015-09-19 NOTE — Progress Notes (Signed)
Cardiology Office Note    Date:  09/19/2015   ID:  [name], [name] 1932-08-08, MRN [id number]  PCP:  Shirline Frees, MD  Cardiologist: Sinclair Grooms, MD   Chief Complaint  Patient presents with  . Atrial Fibrillation    History of Present Illness:  [name] is a 82 y.o. female follow-up of chronic diastolic heart failure, paroxysmal atrial fibrillation, hypertension, chronic anticoagulation, and permanent pacemaker therapy.  Tells me that she will be moving to [facility] to live with her older daughter. She denies. She has not had lower extremity swelling. She feels warm and has some sweating because she feels it is hot in the room. She has not had syncope. She denies chest discomfort. No palpitations.   Past Medical History:  Diagnosis Date  . Anxiety    takes Valium daily as needed  . Arthritis    "joints"  . Chronic back pain    stenosis  . Chronic bronchitis ([place])    "get it practically q yr"   . Chronic diastolic CHF (congestive heart failure) (HCC)    takes Bumex daily  . Coronary atherosclerosis of native coronary artery   . Depression   . Diverticulosis   . Dizziness    occasionally  . GERD (gastroesophageal reflux disease)   . Glaucoma    uses eye drops daily  . Heart murmur   . History of colon polyps   . History of gastric ulcer   . History of gout    doesn't take any meds  . Hypertension    takes Amlodipine and Diovan daily  . Joint pain   . Joint swelling   . Lichen planus   . Muscle spasm    takes Celebrex daily and Robaxin daily as needed  . Osteoarthritis   . Pacemaker   . PAF (paroxysmal atrial fibrillation) (HCC)    takes Coumadin daily  . Pneumonia    as a child  . Stroke [facility]) 1990's   "found it in ~ 1997-1998; never even knew I'd had one"  . Tachy-brady syndrome ([place])    With permanent pacemaker 04/03/99  . TIA (transient ischemic attack)   . Tuberculosis    during childhood unsure of time  .  Type II diabetes mellitus ([place])    but doesn't take any meds (10/19/2013)  . Venous insufficiency     Past Surgical History:  Procedure Laterality Date  . ABDOMINAL HYSTERECTOMY     w/BSO  . BACK SURGERY    . CARDIAC CATHETERIZATION  X 2  . CATARACT EXTRACTION W/ INTRAOCULAR LENS  IMPLANT, BILATERAL Bilateral ?2005  . COLONOSCOPY    . ESOPHAGOGASTRODUODENOSCOPY    . INSERT / REPLACE / REMOVE PACEMAKER  04/03/99; ?2008  . [place] SURGERY  08/2006  . TONSILLECTOMY    . TOTAL KNEE ARTHROPLASTY Right 10/19/2013  . TOTAL KNEE ARTHROPLASTY Right 10/19/2013   Procedure: TOTAL KNEE ARTHROPLASTY;  Surgeon: Hessie Dibble, MD;  Location: [facility];  Service: Orthopedics;  Laterality: Right;  . TUBAL LIGATION  1971    Current Medications: Outpatient Medications Prior to Visit  Medication Sig Dispense Refill  . Acetaminophen (TYLENOL ARTHRITIS PAIN PO) Take 1 tablet by mouth every 6 (six) hours as needed (arthritis).    Marland Kitchen b complex vitamins tablet Take 1 tablet by mouth daily.    . beta carotene 25000 UNIT capsule Take 25,000 Units by mouth 3 (three) times a week.     Marland Kitchen  bimatoprost (LUMIGAN) 0.01 % SOLN Place 1 drop into both eyes at bedtime.    . bumetanide (BUMEX) 1 MG tablet Take 1.5 tablets (1.5 mg total) by mouth daily. 135 tablet 3  . diazepam (VALIUM) 5 MG tablet Take 5 mg by mouth daily as needed for anxiety.     Marland Kitchen diltiazem (CARDIZEM CD) 240 MG 24 hr capsule Take 1 capsule (240 mg total) by mouth daily. 30 capsule 6  . Elderberry 575 MG/5ML SYRP Take 575 mg by mouth daily as needed (cold symptoms).     . Glycerin-Polysorbate 80 (REFRESH DRY EYE THERAPY OP) Apply 1 drop to eye daily as needed (dry eyes).    Marland Kitchen lidocaine (LIDODERM) 5 % Place 1 patch onto the skin daily as needed (pain).     . methocarbamol (ROBAXIN) 500 MG tablet Take 1 tablet (500 mg total) by mouth every 6 (six) hours as needed for muscle spasms. 50 tablet 0  . metoprolol succinate (TOPROL XL) 25 MG 24 hr tablet Take 1  tablet (25 mg total) by mouth daily. 30 tablet 8  . Multiple Vitamin (MULTIVITAMIN WITH MINERALS) TABS Take 1 tablet by mouth daily.    Marland Kitchen nystatin (MYCOSTATIN) 100000 UNIT/ML suspension Take 500,000 Units by mouth daily as needed (for mouth irritation).     Marland Kitchen OVER THE COUNTER MEDICATION Apply 1 application topically 2 (two) times daily as needed (TOENAIL FUNGUS). Med Name: "Formula 3"    . potassium chloride SA (K-DUR,KLOR-CON) 20 MEQ tablet Take 1 tablet (20 mEq total) by mouth daily. 90 tablet 3  . senna (SENOKOT) 8.6 MG tablet Take 2 tablets by mouth at bedtime.     . valsartan (DIOVAN) 320 MG tablet Take 1 tablet (320 mg total) by mouth daily. 30 tablet 10  . vitamin C (ASCORBIC ACID) 500 MG tablet Take 500 mg by mouth daily as needed (COLD SYMPTOMS).     Marland Kitchen warfarin (COUMADIN) 5 MG tablet Take as directed by coumadin clinic 120 tablet 0   No facility-administered medications prior to visit.      Allergies:   Procardia [nifedipine]; Lasix [furosemide]; Levsin [hyoscyamine sulfate]; and Prednisone   Social History   Social History  . Marital status: Widowed    Spouse name: N/A  . Number of children: N/A  . Years of education: N/A   Social History Main Topics  . Smoking status: Never Smoker  . Smokeless tobacco: Never Used  . Alcohol use No  . Drug use: No  . Sexual activity: No   Other Topics Concern  . None   Social History Narrative  . None     Family History:  The patient's family history includes Cancer in her mother; Diabetes in her sister; Heart disease in her father; Heart failure in her sister; Hypertension in her father and mother.   ROS:   Please see the history of present illness.    Chronic shortness of breath and lower extremity swelling. Occasional chest discomfort. Back discomfort and muscle pain. Easy bruising. Joint swelling. Anxiety. Constipation. Irregular heartbeat. Fatigue, and swelling.  All other systems reviewed and are negative.   PHYSICAL EXAM:    VS:  BP (!) 144/80   Pulse 79   Ht 5\' 2"  (1.575 m)   Wt 203 lb 6.4 oz (92.3 kg)   BMI 37.20 kg/m    GEN: Well nourished, well developed, in no acute distress  HEENT: normal  Neck: no JVD, carotid bruits, or masses Cardiac: IIRR; no murmurs, rubs, or gallops,no  edema  Respiratory:  clear to auscultation bilaterally, normal work of breathing GI: soft, nontender, nondistended, + BS MS: no deformity or atrophy  Skin: warm and dry, no rash Neuro:  Alert and Oriented x 3, Strength and sensation are intact Psych: euthymic mood, full affect  Wt Readings from Last 3 Encounters:  09/19/15 203 lb 6.4 oz (92.3 kg)  03/08/15 200 lb 12.8 oz (91.1 kg)  02/15/15 204 lb (92.5 kg)      Studies/Labs Reviewed:   EKG:  EKG  Atrial fib at 79 bpm with occasional pacing.  Recent Labs: No results found for requested labs within last 8760 hours.   Lipid Panel No results found for: CHOL, TRIG, HDL, CHOLHDL, VLDL, LDLCALC, LDLDIRECT  Additional studies/ records that were reviewed today include:  No new data    ASSESSMENT:    1. Chronic diastolic heart failure (HCC)   2. Paroxysmal a-fib (HCC)   3. Essential hypertension   4. Encounter for therapeutic drug monitoring   5. Other specified transient cerebral ischemias   6. Pacemaker     PLAN:  In order of problems listed above:  1. No evidence of volume overload 2. In atrial fib on EKG with controlled rate. Continue current rate control measures. 3. Adequate blood pressure control. Low salt diet is recommended. 4. Coumadin clinic with in the week. Call if any complications.  Plan to send records to her cardiologist in [facility] once established.  Medication Adjustments/Labs and Tests Ordered: Current medicines are reviewed at length with the patient today.  Concerns regarding medicines are outlined above.  Medication changes, Labs and Tests ordered today are listed in the Patient Instructions below. There are no  Patient Instructions on file for this visit.   Signed, Sinclair Grooms, MD  09/19/2015 10:37 AM    [facility] [address], [place], [place]  [address] Phone: [phone]; Fax: [phone]

## 2015-09-21 DIAGNOSIS — I5032 Chronic diastolic (congestive) heart failure: Secondary | ICD-10-CM | POA: Diagnosis not present

## 2015-09-21 DIAGNOSIS — E119 Type 2 diabetes mellitus without complications: Secondary | ICD-10-CM | POA: Diagnosis not present

## 2015-09-21 DIAGNOSIS — R600 Localized edema: Secondary | ICD-10-CM | POA: Diagnosis not present

## 2015-09-21 DIAGNOSIS — M62838 Other muscle spasm: Secondary | ICD-10-CM | POA: Diagnosis not present

## 2015-09-21 DIAGNOSIS — F419 Anxiety disorder, unspecified: Secondary | ICD-10-CM | POA: Diagnosis not present

## 2015-09-21 DIAGNOSIS — I10 Essential (primary) hypertension: Secondary | ICD-10-CM | POA: Diagnosis not present

## 2015-09-21 DIAGNOSIS — E7800 Pure hypercholesterolemia, unspecified: Secondary | ICD-10-CM | POA: Diagnosis not present

## 2015-09-21 DIAGNOSIS — Z23 Encounter for immunization: Secondary | ICD-10-CM | POA: Diagnosis not present

## 2015-09-21 DIAGNOSIS — M1990 Unspecified osteoarthritis, unspecified site: Secondary | ICD-10-CM | POA: Diagnosis not present

## 2015-09-22 IMAGING — CR DG CHEST 2V
2 series · 2 of 2 positions shown · non-contrast
Comparison: Chest x-ray 05/12/2012, CT 10/09/2010

CLINICAL DATA: 80-year-old female with previous history of
tuberculosis. History of coronary artery disease hypertension, heart
murmur, diabetes. Currently presenting preoperative for total knee
arthroplasty.

EXAM:
CHEST  2 VIEW

[w chest pa]
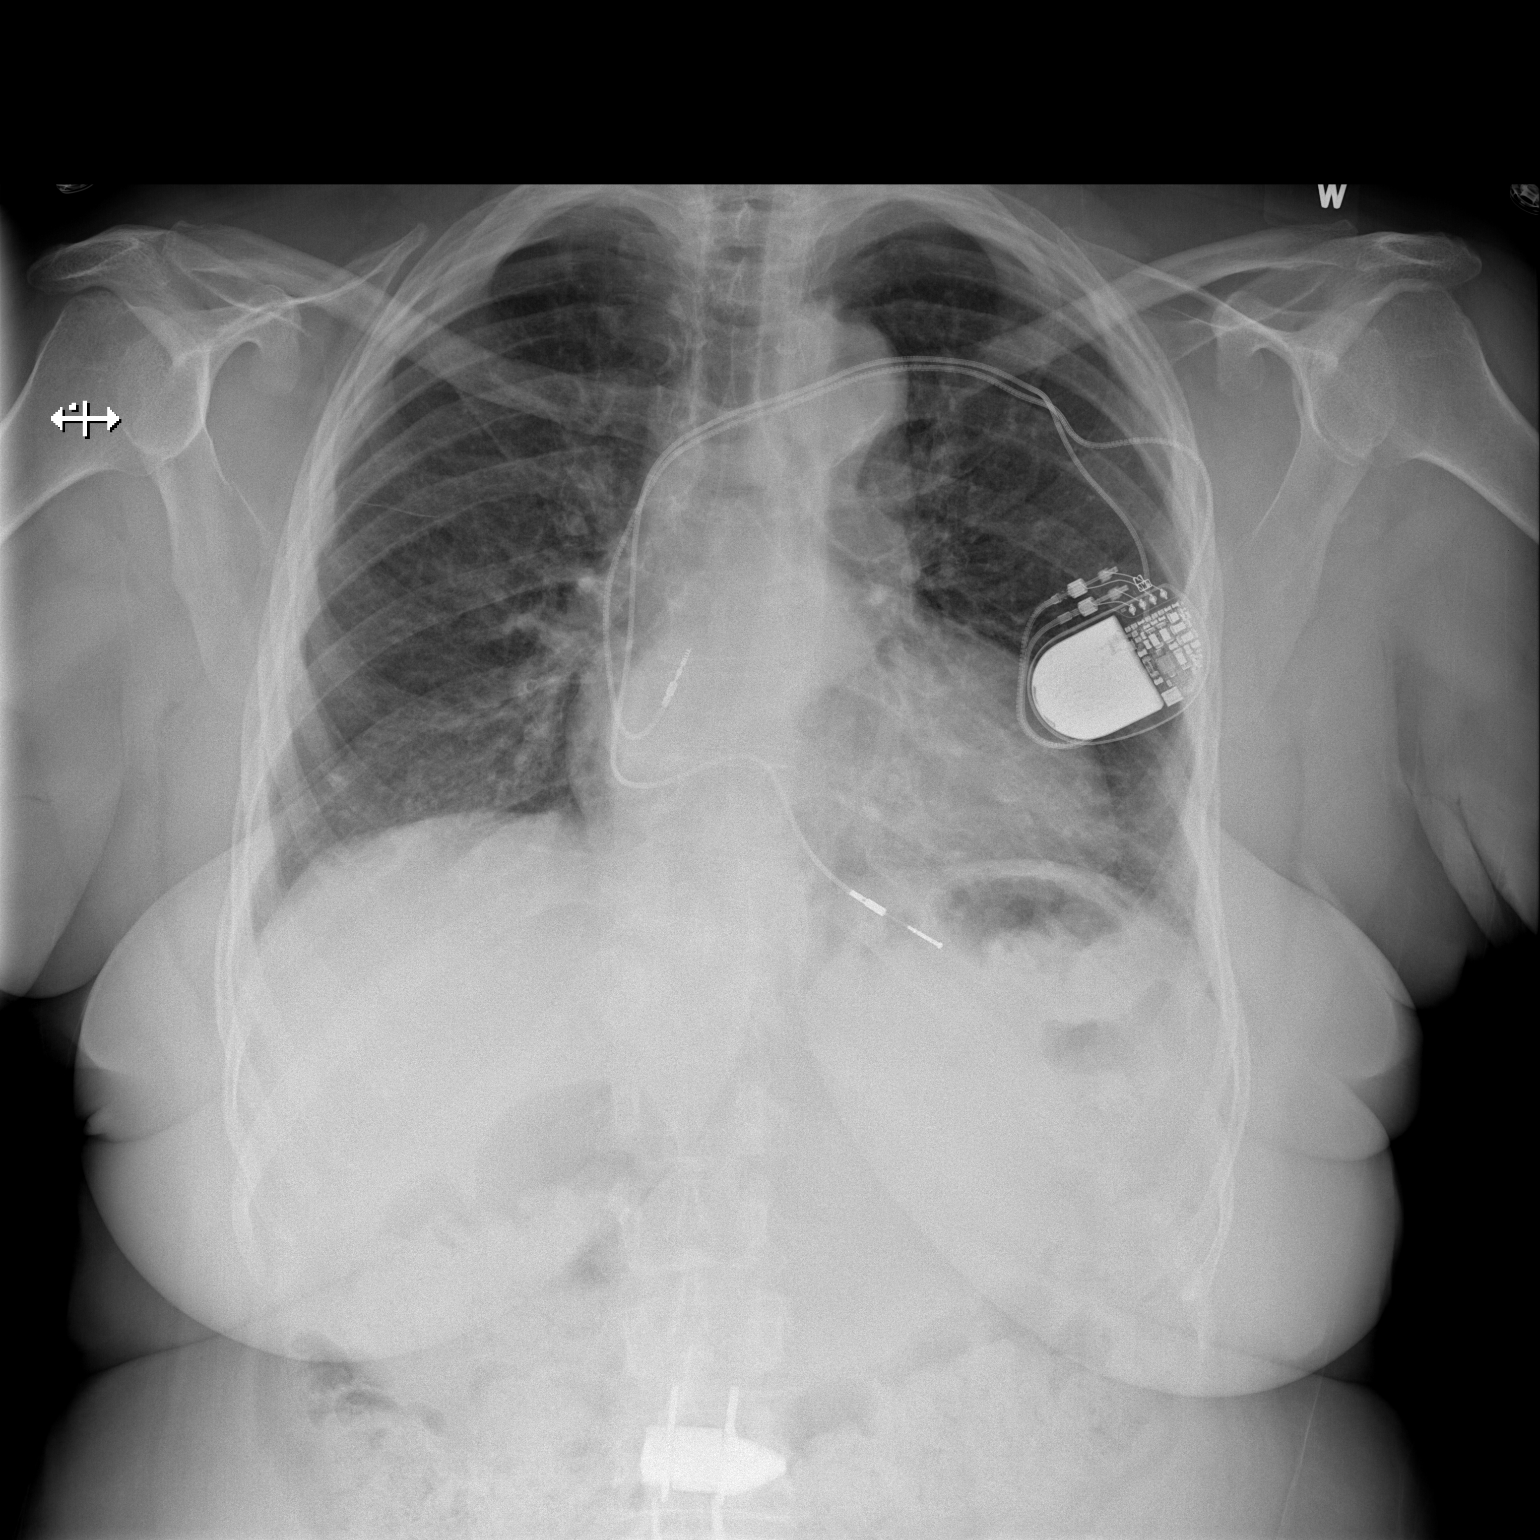

[w chest lat]
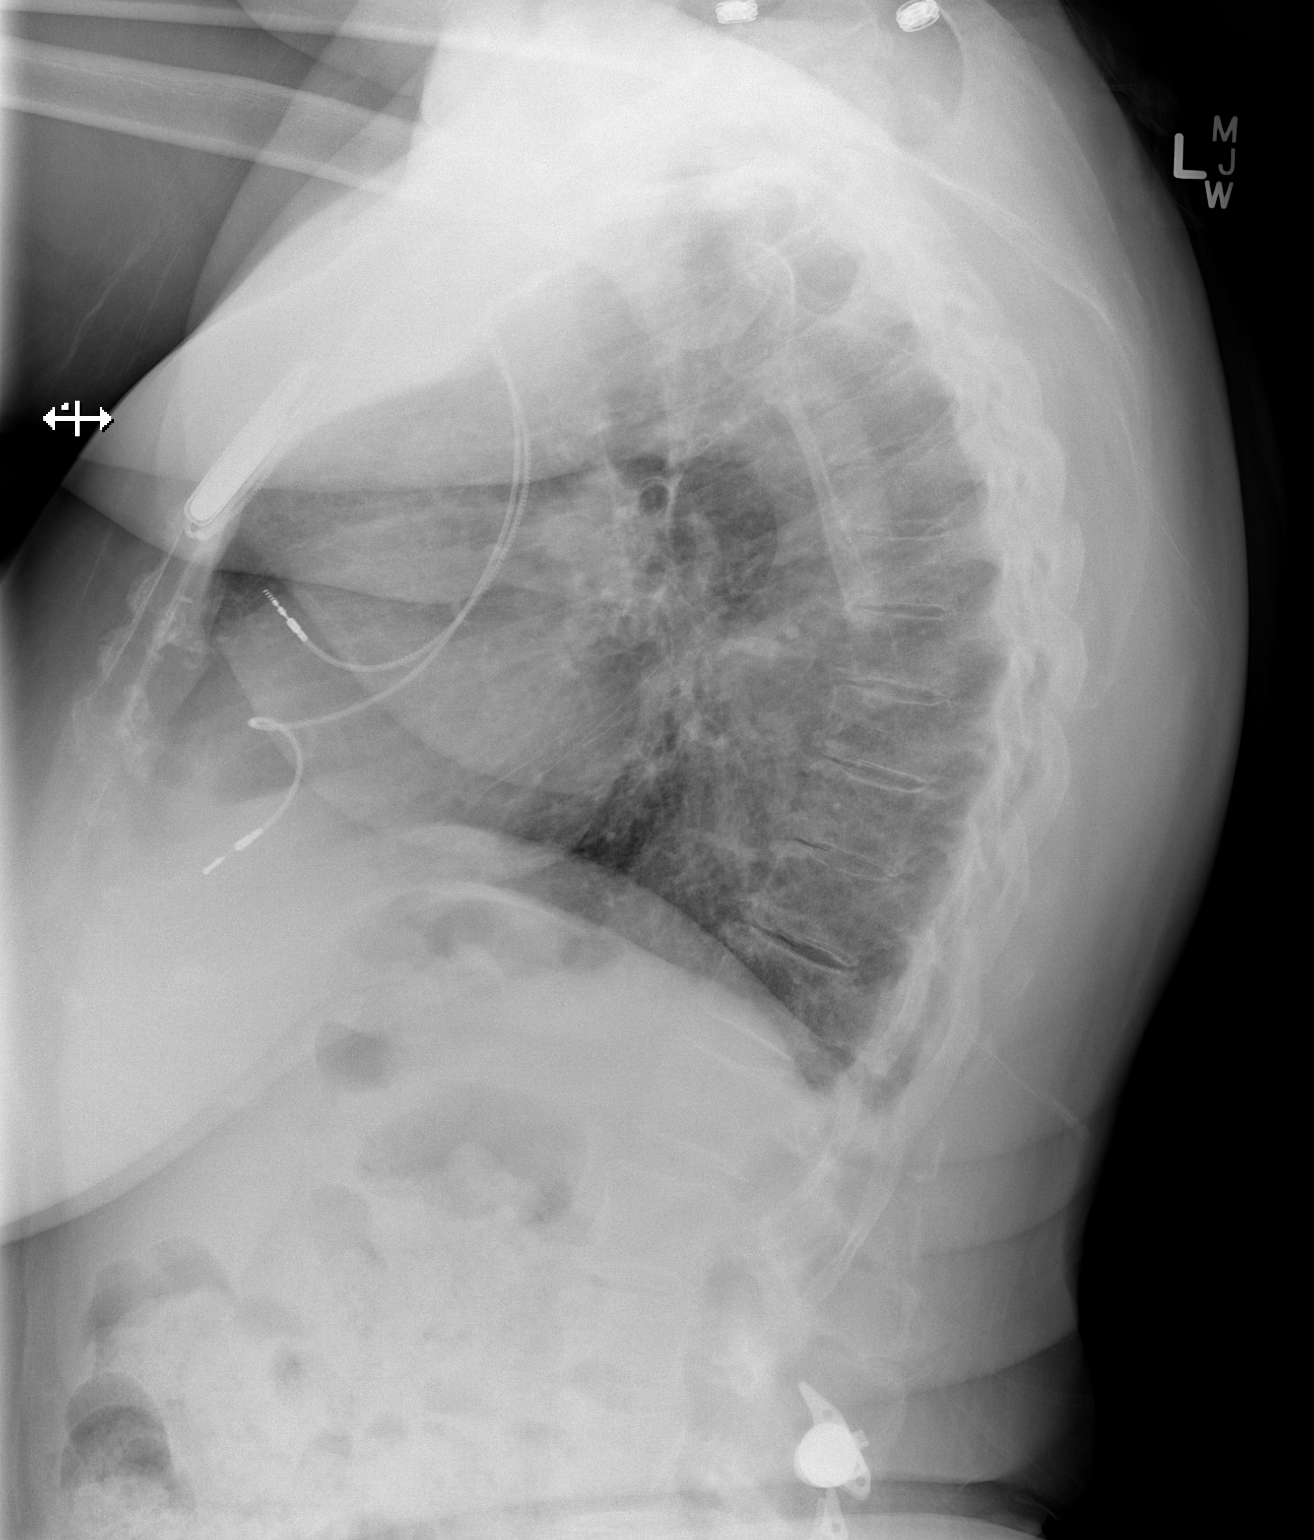

[2 of 2 positions shown; findings below may reference images not displayed]

FINDINGS: Cardiac size unchanged from comparison. Tortuosity of descending
thoracic aorta again noted. Atherosclerotic calcifications of the
aortic arch.

Re- demonstration of cardiac pacing device on left chest wall with 2
leads in place, terminating in the region the right atrium and
overlying the left ventricle.

Interstitial opacities persist without evidence of confluent
airspace disease. Mild flattening of the hemidiaphragms. No
pneumothorax or pleural effusion.

No acute bony abnormality identified. Changes of previous spine
surgery.
IMPRESSION: No radiographic evidence of acute cardiopulmonary disease, with
background of chronic changes.

Atherosclerosis. Evidence of chronic hypertension with tortuous
thoracic aorta.

Unchanged position of left chest wall cardiac pacing device with 2
leads in place.

## 2015-09-26 ENCOUNTER — Ambulatory Visit (INDEPENDENT_AMBULATORY_CARE_PROVIDER_SITE_OTHER): Payer: Medicare Other | Admitting: *Deleted

## 2015-09-26 DIAGNOSIS — I4891 Unspecified atrial fibrillation: Secondary | ICD-10-CM | POA: Diagnosis not present

## 2015-09-26 DIAGNOSIS — Z5181 Encounter for therapeutic drug level monitoring: Secondary | ICD-10-CM

## 2015-09-26 DIAGNOSIS — G458 Other transient cerebral ischemic attacks and related syndromes: Secondary | ICD-10-CM | POA: Diagnosis not present

## 2015-09-26 DIAGNOSIS — I480 Paroxysmal atrial fibrillation: Secondary | ICD-10-CM

## 2015-09-26 LAB — POCT INR: INR: 3.3

## 2015-10-13 ENCOUNTER — Ambulatory Visit (INDEPENDENT_AMBULATORY_CARE_PROVIDER_SITE_OTHER): Payer: Medicare Other | Admitting: *Deleted

## 2015-10-13 DIAGNOSIS — Z5181 Encounter for therapeutic drug level monitoring: Secondary | ICD-10-CM

## 2015-10-13 DIAGNOSIS — G458 Other transient cerebral ischemic attacks and related syndromes: Secondary | ICD-10-CM

## 2015-10-13 DIAGNOSIS — I480 Paroxysmal atrial fibrillation: Secondary | ICD-10-CM

## 2015-10-13 DIAGNOSIS — I4891 Unspecified atrial fibrillation: Secondary | ICD-10-CM

## 2015-10-13 LAB — POCT INR: INR: 2.4

## 2015-10-19 DIAGNOSIS — H15002 Unspecified scleritis, left eye: Secondary | ICD-10-CM | POA: Diagnosis not present

## 2015-10-19 DIAGNOSIS — H524 Presbyopia: Secondary | ICD-10-CM | POA: Diagnosis not present

## 2015-10-19 DIAGNOSIS — H26491 Other secondary cataract, right eye: Secondary | ICD-10-CM | POA: Diagnosis not present

## 2015-10-19 DIAGNOSIS — H401132 Primary open-angle glaucoma, bilateral, moderate stage: Secondary | ICD-10-CM | POA: Diagnosis not present

## 2015-10-19 DIAGNOSIS — H04123 Dry eye syndrome of bilateral lacrimal glands: Secondary | ICD-10-CM | POA: Diagnosis not present

## 2015-11-07 ENCOUNTER — Other Ambulatory Visit: Payer: Self-pay | Admitting: Interventional Cardiology

## 2015-11-08 ENCOUNTER — Ambulatory Visit (INDEPENDENT_AMBULATORY_CARE_PROVIDER_SITE_OTHER): Payer: Medicare Other | Admitting: *Deleted

## 2015-11-08 DIAGNOSIS — I4891 Unspecified atrial fibrillation: Secondary | ICD-10-CM

## 2015-11-08 DIAGNOSIS — Z5181 Encounter for therapeutic drug level monitoring: Secondary | ICD-10-CM

## 2015-11-08 DIAGNOSIS — G458 Other transient cerebral ischemic attacks and related syndromes: Secondary | ICD-10-CM | POA: Diagnosis not present

## 2015-11-08 DIAGNOSIS — I480 Paroxysmal atrial fibrillation: Secondary | ICD-10-CM

## 2015-11-08 LAB — POCT INR: INR: 1.7

## 2015-11-20 ENCOUNTER — Other Ambulatory Visit: Payer: Self-pay | Admitting: Interventional Cardiology

## 2015-12-01 ENCOUNTER — Ambulatory Visit (INDEPENDENT_AMBULATORY_CARE_PROVIDER_SITE_OTHER): Payer: Medicare Other | Admitting: *Deleted

## 2015-12-01 DIAGNOSIS — G458 Other transient cerebral ischemic attacks and related syndromes: Secondary | ICD-10-CM

## 2015-12-01 DIAGNOSIS — I480 Paroxysmal atrial fibrillation: Secondary | ICD-10-CM | POA: Diagnosis not present

## 2015-12-01 DIAGNOSIS — Z5181 Encounter for therapeutic drug level monitoring: Secondary | ICD-10-CM

## 2015-12-01 DIAGNOSIS — I4891 Unspecified atrial fibrillation: Secondary | ICD-10-CM

## 2015-12-01 LAB — POCT INR: INR: 2.3

## 2015-12-26 ENCOUNTER — Ambulatory Visit (INDEPENDENT_AMBULATORY_CARE_PROVIDER_SITE_OTHER): Payer: Medicare Other | Admitting: *Deleted

## 2015-12-26 DIAGNOSIS — I4891 Unspecified atrial fibrillation: Secondary | ICD-10-CM | POA: Diagnosis not present

## 2015-12-26 DIAGNOSIS — I480 Paroxysmal atrial fibrillation: Secondary | ICD-10-CM | POA: Diagnosis not present

## 2015-12-26 DIAGNOSIS — Z5181 Encounter for therapeutic drug level monitoring: Secondary | ICD-10-CM

## 2015-12-26 LAB — POCT INR: INR: 2.1

## 2015-12-27 DIAGNOSIS — I10 Essential (primary) hypertension: Secondary | ICD-10-CM | POA: Diagnosis not present

## 2015-12-27 DIAGNOSIS — E119 Type 2 diabetes mellitus without complications: Secondary | ICD-10-CM | POA: Diagnosis not present

## 2015-12-27 DIAGNOSIS — E7800 Pure hypercholesterolemia, unspecified: Secondary | ICD-10-CM | POA: Diagnosis not present

## 2015-12-27 DIAGNOSIS — F419 Anxiety disorder, unspecified: Secondary | ICD-10-CM | POA: Diagnosis not present

## 2015-12-27 DIAGNOSIS — M1990 Unspecified osteoarthritis, unspecified site: Secondary | ICD-10-CM | POA: Diagnosis not present

## 2015-12-27 DIAGNOSIS — R600 Localized edema: Secondary | ICD-10-CM | POA: Diagnosis not present

## 2016-01-10 ENCOUNTER — Encounter: Payer: Self-pay | Admitting: Internal Medicine

## 2016-01-15 ENCOUNTER — Encounter: Payer: Self-pay | Admitting: Internal Medicine

## 2016-01-15 ENCOUNTER — Ambulatory Visit (INDEPENDENT_AMBULATORY_CARE_PROVIDER_SITE_OTHER): Payer: Medicare Other | Admitting: Pharmacist

## 2016-01-15 ENCOUNTER — Ambulatory Visit (INDEPENDENT_AMBULATORY_CARE_PROVIDER_SITE_OTHER): Payer: Medicare Other | Admitting: Internal Medicine

## 2016-01-15 VITALS — BP 140/72 | HR 80 | Ht 61.0 in | Wt 199.6 lb

## 2016-01-15 DIAGNOSIS — I482 Chronic atrial fibrillation: Secondary | ICD-10-CM

## 2016-01-15 DIAGNOSIS — I4821 Permanent atrial fibrillation: Secondary | ICD-10-CM

## 2016-01-15 DIAGNOSIS — I10 Essential (primary) hypertension: Secondary | ICD-10-CM

## 2016-01-15 DIAGNOSIS — I4891 Unspecified atrial fibrillation: Secondary | ICD-10-CM

## 2016-01-15 DIAGNOSIS — Z5181 Encounter for therapeutic drug level monitoring: Secondary | ICD-10-CM

## 2016-01-15 DIAGNOSIS — I480 Paroxysmal atrial fibrillation: Secondary | ICD-10-CM

## 2016-01-15 LAB — CUP PACEART INCLINIC DEVICE CHECK
Brady Statistic AP VS Percent: 0 %
Brady Statistic AS VP Percent: 1 %
Brady Statistic AS VS Percent: 99 %
Implantable Lead Implant Date: 20010306
Implantable Lead Location: 753860
Implantable Lead Model: 5092
Lead Channel Impedance Value: 620 Ohm
Lead Channel Pacing Threshold Amplitude: 1.5 V
Lead Channel Pacing Threshold Pulse Width: 0.4 ms
Lead Channel Sensing Intrinsic Amplitude: 0.35 mV
Lead Channel Sensing Intrinsic Amplitude: 11.2 mV
Lead Channel Setting Pacing Amplitude: 2 V
MDC IDC LEAD IMPLANT DT: 20010306
MDC IDC LEAD LOCATION: 753859
MDC IDC MSMT BATTERY IMPEDANCE: 2490 Ohm
MDC IDC MSMT BATTERY REMAINING LONGEVITY: 15 mo
MDC IDC MSMT BATTERY VOLTAGE: 2.68 V
MDC IDC MSMT LEADCHNL RA IMPEDANCE VALUE: 450 Ohm
MDC IDC PG IMPLANT DT: 20080625
MDC IDC SESS DTM: 20171218144533
MDC IDC SET LEADCHNL RV PACING AMPLITUDE: 3 V
MDC IDC SET LEADCHNL RV PACING PULSEWIDTH: 0.4 ms
MDC IDC SET LEADCHNL RV SENSING SENSITIVITY: 4 mV
MDC IDC STAT BRADY AP VP PERCENT: 0 %

## 2016-01-15 LAB — POCT INR: INR: 2.6

## 2016-01-15 NOTE — Patient Instructions (Signed)
Medication Instructions:  Your physician recommends that you continue on your current medications as directed. Please refer to the Current Medication list given to you today.   Labwork: None Ordered   Testing/Procedures: None Ordered   Follow-Up: Your physician wants you to follow-up in: 6 months in [facility] and 1 year with Dr. Lovena Le. You will receive a reminder letter in the mail two months in advance. If you don't receive a letter, please call our office to schedule the follow-up appointment.     Any Other Special Instructions Will Be Listed Below (If Applicable).     If you need a refill on your cardiac medications before your next appointment, please call your pharmacy.

## 2016-01-15 NOTE — Progress Notes (Signed)
HPI [name] returns today for ongoing evaluation and management of her Medtronic dual-chamber pacemaker. The patient has a history of sinus node dysfunction and is status post permanent pacemaker insertion, initially in 2001. She underwent generator change out in 2008. She has chronic class II diastolic heart failure, and obesity. She also has a history of gout. In addition she has a history of paroxysmal atrial fibrillation which has become persistent. She denies anginal symptoms. No peripheral edema. No syncope. She remains active.  Allergies  Allergen Reactions  . Procardia [Nifedipine] Other (See Comments)    GI uspet  . Lasix [Furosemide] Other (See Comments)    "Too strong" - tolerates Bumex  . Levsin [Hyoscyamine Sulfate] Other (See Comments)    unknown  . Prednisone Palpitations and Other (See Comments)    Tachycardia, fluttering  Can tolerate low doses     Current Outpatient Prescriptions  Medication Sig Dispense Refill  . Acetaminophen (TYLENOL ARTHRITIS PAIN PO) Take 1 tablet by mouth every 6 (six) hours as needed (arthritis).    Marland Kitchen b complex vitamins tablet Take 1 tablet by mouth daily.    . beta carotene 25000 UNIT capsule Take 25,000 Units by mouth 3 (three) times a week.     . bumetanide (BUMEX) 1 MG tablet Take 1.5 tablets (1.5 mg total) by mouth daily. 135 tablet 3  . diazepam (VALIUM) 5 MG tablet Take 5 mg by mouth daily as needed for anxiety.     Marland Kitchen diltiazem (CARDIZEM CD) 240 MG 24 hr capsule Take 1 capsule (240 mg total) by mouth daily. 30 capsule 6  . Elderberry 575 MG/5ML SYRP Take 575 mg by mouth daily as needed (cold symptoms).     . Glycerin-Polysorbate 80 (REFRESH DRY EYE THERAPY OP) Apply 1 drop to eye daily as needed (dry eyes).    . methocarbamol (ROBAXIN) 500 MG tablet Take 1 tablet (500 mg total) by mouth every 6 (six) hours as needed for muscle spasms. 50 tablet 0  . metoprolol succinate (TOPROL XL) 25 MG 24 hr tablet Take 1 tablet (25 mg  total) by mouth daily. 30 tablet 8  . Multiple Vitamin (MULTIVITAMIN WITH MINERALS) TABS Take 1 tablet by mouth daily.    Marland Kitchen nystatin (MYCOSTATIN) 100000 UNIT/ML suspension Take 500,000 Units by mouth daily as needed (for mouth irritation).     Marland Kitchen OVER THE COUNTER MEDICATION Apply 1 application topically 2 (two) times daily as needed (TOENAIL FUNGUS). Med Name: "Formula 3"    . potassium chloride SA (K-DUR,KLOR-CON) 20 MEQ tablet Take 1 tablet (20 mEq total) by mouth daily. 90 tablet 3  . senna (SENOKOT) 8.6 MG tablet Take 2 tablets by mouth at bedtime.     . valsartan (DIOVAN) 320 MG tablet take 1 tablet by mouth once daily 30 tablet 10  . vitamin C (ASCORBIC ACID) 500 MG tablet Take 500 mg by mouth daily as needed (COLD SYMPTOMS).     Marland Kitchen warfarin (COUMADIN) 5 MG tablet TAKE BY MOUTH AS DIRECTED BY COUMADIN CLINIC 120 tablet 0  . latanoprost (XALATAN) 0.005 % ophthalmic solution Place 1 drop into both eyes at bedtime.     No current facility-administered medications for this visit.      Past Medical History:  Diagnosis Date  . Anxiety    takes Valium daily as needed  . Arthritis    "joints"  . Chronic back pain    stenosis  . Chronic bronchitis ([place])    "get it  practically q yr"   . Chronic diastolic CHF (congestive heart failure) (HCC)    takes Bumex daily  . Coronary atherosclerosis of native coronary artery   . Depression   . Diverticulosis   . Dizziness    occasionally  . GERD (gastroesophageal reflux disease)   . Glaucoma    uses eye drops daily  . Heart murmur   . History of colon polyps   . History of gastric ulcer   . History of gout    doesn't take any meds  . Hypertension    takes Amlodipine and Diovan daily  . Joint pain   . Joint swelling   . Lichen planus   . Muscle spasm    takes Celebrex daily and Robaxin daily as needed  . Osteoarthritis   . Pacemaker   . PAF (paroxysmal atrial fibrillation) (HCC)    takes Coumadin daily  . Pneumonia    as a child  .  Stroke [facility]) 1990's   "found it in ~ 1997-1998; never even knew I'd had one"  . Tachy-brady syndrome ([place])    With permanent pacemaker 04/03/99  . TIA (transient ischemic attack)   . Tuberculosis    during childhood unsure of time  . Type II diabetes mellitus ([place])    but doesn't take any meds (10/19/2013)  . Venous insufficiency     ROS:   All systems reviewed and negative except as noted in the HPI.   Past Surgical History:  Procedure Laterality Date  . ABDOMINAL HYSTERECTOMY     w/BSO  . BACK SURGERY    . CARDIAC CATHETERIZATION  X 2  . CATARACT EXTRACTION W/ INTRAOCULAR LENS  IMPLANT, BILATERAL Bilateral ?2005  . COLONOSCOPY    . ESOPHAGOGASTRODUODENOSCOPY    . INSERT / REPLACE / REMOVE PACEMAKER  04/03/99; ?2008  . [place] SURGERY  08/2006  . TONSILLECTOMY    . TOTAL KNEE ARTHROPLASTY Right 10/19/2013  . TOTAL KNEE ARTHROPLASTY Right 10/19/2013   Procedure: TOTAL KNEE ARTHROPLASTY;  Surgeon: Hessie Dibble, MD;  Location: [facility];  Service: Orthopedics;  Laterality: Right;  . TUBAL LIGATION  1971     Family History  Problem Relation Age of Onset  . Hypertension Mother   . Cancer Mother     Maglignant Brain Tumor  . Heart disease Father   . Hypertension Father   . Heart failure Sister   . Diabetes Sister   . Malignant hyperthermia Neg Hx      Social History   Social History  . Marital status: Widowed    Spouse name: N/A  . Number of children: N/A  . Years of education: N/A   Occupational History  . Not on file.   Social History Main Topics  . Smoking status: Never Smoker  . Smokeless tobacco: Never Used  . Alcohol use No  . Drug use: No  . Sexual activity: No   Other Topics Concern  . Not on file   Social History Narrative  . No narrative on file     BP 140/72   Pulse 80   Ht 5\' 1"  (1.549 m)   Wt 199 lb 9.6 oz (90.5 kg)   SpO2 99%   BMI 37.71 kg/m   Physical Exam:  Well appearing obese, 82 year old woman, NAD HEENT:  Unremarkable Neck:  6 cm JVD, no thyromegally Back:  No CVA tenderness Lungs:  Clear with no wheezes, rales, or rhonchi. HEART:  Regular rate rhythm, no murmurs, no rubs, no  clicks Abd:  soft, positive bowel sounds, no organomegally, no rebound, no guarding Ext:  2 plus pulses, no edema, no cyanosis, no clubbing Skin:  No rashes no nodules Neuro:  CN II through XII intact, motor grossly intact  DEVICE  Normal device function.  See PaceArt for details.   Assess/Plan: 1. Atrial fibrillation - her ventricular rate is well controlled. Will follow. She will continue her warfarin 2. PPM - her medtronic PPM is working normally. Will recheck in several months. 3. HTN - her blood pressure is a bit elevated. She is encouraged to lose weight and maintain a low sodium diet.   Mikle Bosworth.D.

## 2016-02-15 ENCOUNTER — Other Ambulatory Visit: Payer: Self-pay | Admitting: Interventional Cardiology

## 2016-03-03 ENCOUNTER — Other Ambulatory Visit: Payer: Self-pay | Admitting: Interventional Cardiology

## 2016-03-04 ENCOUNTER — Ambulatory Visit (INDEPENDENT_AMBULATORY_CARE_PROVIDER_SITE_OTHER): Payer: Medicare Other | Admitting: *Deleted

## 2016-03-04 DIAGNOSIS — H401132 Primary open-angle glaucoma, bilateral, moderate stage: Secondary | ICD-10-CM | POA: Diagnosis not present

## 2016-03-04 DIAGNOSIS — Z5181 Encounter for therapeutic drug level monitoring: Secondary | ICD-10-CM

## 2016-03-04 DIAGNOSIS — H04123 Dry eye syndrome of bilateral lacrimal glands: Secondary | ICD-10-CM | POA: Diagnosis not present

## 2016-03-04 DIAGNOSIS — I480 Paroxysmal atrial fibrillation: Secondary | ICD-10-CM

## 2016-03-04 DIAGNOSIS — H26491 Other secondary cataract, right eye: Secondary | ICD-10-CM | POA: Diagnosis not present

## 2016-03-04 DIAGNOSIS — I4891 Unspecified atrial fibrillation: Secondary | ICD-10-CM | POA: Diagnosis not present

## 2016-03-04 DIAGNOSIS — H524 Presbyopia: Secondary | ICD-10-CM | POA: Diagnosis not present

## 2016-03-04 LAB — POCT INR: INR: 2.6

## 2016-03-04 MED ORDER — WARFARIN SODIUM 5 MG PO TABS: ORAL_TABLET | ORAL | 0 refills | Status: DC

## 2016-03-05 ENCOUNTER — Other Ambulatory Visit: Payer: Self-pay | Admitting: *Deleted

## 2016-03-05 MED ORDER — DILTIAZEM HCL ER COATED BEADS 240 MG PO CP24: 240.0000 mg | ORAL_CAPSULE | Freq: Every day | ORAL | 5 refills | Status: DC

## 2016-03-22 DIAGNOSIS — F419 Anxiety disorder, unspecified: Secondary | ICD-10-CM | POA: Diagnosis not present

## 2016-03-22 DIAGNOSIS — E7800 Pure hypercholesterolemia, unspecified: Secondary | ICD-10-CM | POA: Diagnosis not present

## 2016-03-22 DIAGNOSIS — M1990 Unspecified osteoarthritis, unspecified site: Secondary | ICD-10-CM | POA: Diagnosis not present

## 2016-03-22 DIAGNOSIS — R600 Localized edema: Secondary | ICD-10-CM | POA: Diagnosis not present

## 2016-03-22 DIAGNOSIS — E119 Type 2 diabetes mellitus without complications: Secondary | ICD-10-CM | POA: Diagnosis not present

## 2016-03-22 DIAGNOSIS — I10 Essential (primary) hypertension: Secondary | ICD-10-CM | POA: Diagnosis not present

## 2016-03-22 DIAGNOSIS — I480 Paroxysmal atrial fibrillation: Secondary | ICD-10-CM | POA: Diagnosis not present

## 2016-04-04 ENCOUNTER — Ambulatory Visit (INDEPENDENT_AMBULATORY_CARE_PROVIDER_SITE_OTHER): Payer: Medicare Other

## 2016-04-04 DIAGNOSIS — I4891 Unspecified atrial fibrillation: Secondary | ICD-10-CM | POA: Diagnosis not present

## 2016-04-04 DIAGNOSIS — I10 Essential (primary) hypertension: Secondary | ICD-10-CM | POA: Diagnosis not present

## 2016-04-04 DIAGNOSIS — I480 Paroxysmal atrial fibrillation: Secondary | ICD-10-CM

## 2016-04-04 DIAGNOSIS — Z5181 Encounter for therapeutic drug level monitoring: Secondary | ICD-10-CM | POA: Diagnosis not present

## 2016-04-04 DIAGNOSIS — E7800 Pure hypercholesterolemia, unspecified: Secondary | ICD-10-CM | POA: Diagnosis not present

## 2016-04-04 LAB — POCT INR: INR: 2.8

## 2016-04-12 ENCOUNTER — Telehealth: Payer: Self-pay | Admitting: Cardiology

## 2016-04-12 NOTE — Telephone Encounter (Signed)
Pt called and stated that she was scheduled for a remote transmission on Monday April 15, 2016. Pt does not have a home monitor. According to paceart documentation pt was to have a 6 month device clinic appt. Pt stated that she could not wait until June because she has been short winded, she can barley walk a hundred feet w/o giving out of breath, and she has experienced some dizziness. Phone call routed to device clinic RN.

## 2016-04-12 NOTE — Telephone Encounter (Signed)
Call transferred to triage for SOB, increased swelling of ankles and feet.  Gets winded doing light activities like dusting.  She has noticed this for the last 2 weeks.  At a recent PCP appointment her weight was 206 lb, at last appt with Dr. Tamala Julian in 8/17 her weight was 203 lb.  She is eating more food from restaurants/fast food than she is used to.  Also when she has to go out in the mornings sometimes, she takes 1 bumex instead of 1.5 tablets like she is prescribed.  Pt moved to [place], [place] to live with her daughter and has not established with a cardiologist or PCP there yet.  Reviewed with Dr. Tamala Julian.  Recommends 1.) take 2 Bumex once a day for 3 days, then go back to 1.5 tablets daily 2.) establish with a cardiologist within the next 2 weeks.  Informed patient of this, she is appreciative for the information/assistance.

## 2016-04-12 NOTE — Telephone Encounter (Signed)
Spoke with patient who states she has increased ShOB increasing with activity as well as an increase in ankle/leg swelling recently. She lives in [place], [place] and has not established with a provider as of yet. Will forward to triage.

## 2016-04-26 ENCOUNTER — Other Ambulatory Visit: Payer: Self-pay | Admitting: Interventional Cardiology

## 2016-04-26 DIAGNOSIS — I5032 Chronic diastolic (congestive) heart failure: Secondary | ICD-10-CM

## 2016-05-02 ENCOUNTER — Ambulatory Visit (INDEPENDENT_AMBULATORY_CARE_PROVIDER_SITE_OTHER): Payer: Medicare Other | Admitting: *Deleted

## 2016-05-02 DIAGNOSIS — H401132 Primary open-angle glaucoma, bilateral, moderate stage: Secondary | ICD-10-CM | POA: Diagnosis not present

## 2016-05-02 DIAGNOSIS — H524 Presbyopia: Secondary | ICD-10-CM | POA: Diagnosis not present

## 2016-05-02 DIAGNOSIS — H52221 Regular astigmatism, right eye: Secondary | ICD-10-CM | POA: Diagnosis not present

## 2016-05-02 DIAGNOSIS — Z5181 Encounter for therapeutic drug level monitoring: Secondary | ICD-10-CM

## 2016-05-02 DIAGNOSIS — I4891 Unspecified atrial fibrillation: Secondary | ICD-10-CM

## 2016-05-02 DIAGNOSIS — E119 Type 2 diabetes mellitus without complications: Secondary | ICD-10-CM | POA: Diagnosis not present

## 2016-05-02 DIAGNOSIS — I480 Paroxysmal atrial fibrillation: Secondary | ICD-10-CM | POA: Diagnosis not present

## 2016-05-02 DIAGNOSIS — H26493 Other secondary cataract, bilateral: Secondary | ICD-10-CM | POA: Diagnosis not present

## 2016-05-02 LAB — POCT INR: INR: 2.3

## 2016-05-10 DIAGNOSIS — Z45010 Encounter for checking and testing of cardiac pacemaker pulse generator [battery]: Secondary | ICD-10-CM | POA: Diagnosis not present

## 2016-05-10 DIAGNOSIS — R42 Dizziness and giddiness: Secondary | ICD-10-CM | POA: Diagnosis not present

## 2016-05-10 DIAGNOSIS — I259 Chronic ischemic heart disease, unspecified: Secondary | ICD-10-CM | POA: Diagnosis not present

## 2016-05-10 DIAGNOSIS — R600 Localized edema: Secondary | ICD-10-CM | POA: Diagnosis not present

## 2016-05-10 DIAGNOSIS — R918 Other nonspecific abnormal finding of lung field: Secondary | ICD-10-CM | POA: Diagnosis not present

## 2016-05-10 DIAGNOSIS — I484 Atypical atrial flutter: Secondary | ICD-10-CM | POA: Diagnosis not present

## 2016-05-10 DIAGNOSIS — I517 Cardiomegaly: Secondary | ICD-10-CM | POA: Diagnosis not present

## 2016-05-10 DIAGNOSIS — I4891 Unspecified atrial fibrillation: Secondary | ICD-10-CM | POA: Diagnosis not present

## 2016-05-10 DIAGNOSIS — I481 Persistent atrial fibrillation: Secondary | ICD-10-CM | POA: Diagnosis not present

## 2016-05-10 DIAGNOSIS — H409 Unspecified glaucoma: Secondary | ICD-10-CM | POA: Diagnosis not present

## 2016-05-10 DIAGNOSIS — I771 Stricture of artery: Secondary | ICD-10-CM | POA: Diagnosis not present

## 2016-05-10 DIAGNOSIS — R079 Chest pain, unspecified: Secondary | ICD-10-CM | POA: Diagnosis not present

## 2016-05-10 DIAGNOSIS — R0789 Other chest pain: Secondary | ICD-10-CM | POA: Diagnosis not present

## 2016-05-11 DIAGNOSIS — I4891 Unspecified atrial fibrillation: Secondary | ICD-10-CM | POA: Diagnosis not present

## 2016-05-11 DIAGNOSIS — I259 Chronic ischemic heart disease, unspecified: Secondary | ICD-10-CM | POA: Insufficient documentation

## 2016-05-11 DIAGNOSIS — R079 Chest pain, unspecified: Secondary | ICD-10-CM | POA: Diagnosis not present

## 2016-05-11 DIAGNOSIS — R0789 Other chest pain: Secondary | ICD-10-CM | POA: Diagnosis not present

## 2016-05-11 DIAGNOSIS — I4892 Unspecified atrial flutter: Secondary | ICD-10-CM | POA: Diagnosis not present

## 2016-05-11 DIAGNOSIS — R0602 Shortness of breath: Secondary | ICD-10-CM | POA: Diagnosis not present

## 2016-05-11 DIAGNOSIS — E8770 Fluid overload, unspecified: Secondary | ICD-10-CM | POA: Diagnosis not present

## 2016-05-12 DIAGNOSIS — Z950 Presence of cardiac pacemaker: Secondary | ICD-10-CM | POA: Diagnosis not present

## 2016-05-12 DIAGNOSIS — R0789 Other chest pain: Secondary | ICD-10-CM | POA: Diagnosis not present

## 2016-05-12 DIAGNOSIS — I10 Essential (primary) hypertension: Secondary | ICD-10-CM | POA: Diagnosis not present

## 2016-05-12 DIAGNOSIS — E669 Obesity, unspecified: Secondary | ICD-10-CM | POA: Diagnosis not present

## 2016-05-12 DIAGNOSIS — E1169 Type 2 diabetes mellitus with other specified complication: Secondary | ICD-10-CM | POA: Diagnosis not present

## 2016-05-12 DIAGNOSIS — R079 Chest pain, unspecified: Secondary | ICD-10-CM | POA: Diagnosis not present

## 2016-05-13 DIAGNOSIS — I110 Hypertensive heart disease with heart failure: Secondary | ICD-10-CM | POA: Diagnosis present

## 2016-05-13 DIAGNOSIS — I259 Chronic ischemic heart disease, unspecified: Secondary | ICD-10-CM | POA: Diagnosis present

## 2016-05-13 DIAGNOSIS — Z45010 Encounter for checking and testing of cardiac pacemaker pulse generator [battery]: Secondary | ICD-10-CM | POA: Diagnosis not present

## 2016-05-13 DIAGNOSIS — Z6836 Body mass index (BMI) 36.0-36.9, adult: Secondary | ICD-10-CM | POA: Diagnosis not present

## 2016-05-13 DIAGNOSIS — I509 Heart failure, unspecified: Secondary | ICD-10-CM | POA: Diagnosis present

## 2016-05-13 DIAGNOSIS — Z45018 Encounter for adjustment and management of other part of cardiac pacemaker: Secondary | ICD-10-CM | POA: Diagnosis not present

## 2016-05-13 DIAGNOSIS — Z950 Presence of cardiac pacemaker: Secondary | ICD-10-CM | POA: Diagnosis not present

## 2016-05-13 DIAGNOSIS — R079 Chest pain, unspecified: Secondary | ICD-10-CM | POA: Diagnosis not present

## 2016-05-13 DIAGNOSIS — I10 Essential (primary) hypertension: Secondary | ICD-10-CM | POA: Diagnosis not present

## 2016-05-13 DIAGNOSIS — I4892 Unspecified atrial flutter: Secondary | ICD-10-CM | POA: Diagnosis not present

## 2016-05-13 DIAGNOSIS — E1169 Type 2 diabetes mellitus with other specified complication: Secondary | ICD-10-CM | POA: Diagnosis not present

## 2016-05-13 DIAGNOSIS — Z7901 Long term (current) use of anticoagulants: Secondary | ICD-10-CM | POA: Diagnosis not present

## 2016-05-13 DIAGNOSIS — I482 Chronic atrial fibrillation: Secondary | ICD-10-CM | POA: Diagnosis present

## 2016-05-13 DIAGNOSIS — E669 Obesity, unspecified: Secondary | ICD-10-CM | POA: Diagnosis present

## 2016-05-13 DIAGNOSIS — E119 Type 2 diabetes mellitus without complications: Secondary | ICD-10-CM | POA: Diagnosis present

## 2016-05-13 DIAGNOSIS — H409 Unspecified glaucoma: Secondary | ICD-10-CM | POA: Diagnosis present

## 2016-05-13 DIAGNOSIS — I484 Atypical atrial flutter: Secondary | ICD-10-CM | POA: Diagnosis present

## 2016-05-13 DIAGNOSIS — I4891 Unspecified atrial fibrillation: Secondary | ICD-10-CM | POA: Diagnosis not present

## 2016-05-13 DIAGNOSIS — Z87891 Personal history of nicotine dependence: Secondary | ICD-10-CM | POA: Diagnosis not present

## 2016-05-13 DIAGNOSIS — I481 Persistent atrial fibrillation: Secondary | ICD-10-CM | POA: Diagnosis present

## 2016-05-13 DIAGNOSIS — R0789 Other chest pain: Secondary | ICD-10-CM | POA: Diagnosis present

## 2016-05-22 DIAGNOSIS — Z950 Presence of cardiac pacemaker: Secondary | ICD-10-CM | POA: Diagnosis not present

## 2016-05-22 DIAGNOSIS — I4891 Unspecified atrial fibrillation: Secondary | ICD-10-CM | POA: Diagnosis not present

## 2016-05-22 DIAGNOSIS — Z4801 Encounter for change or removal of surgical wound dressing: Secondary | ICD-10-CM | POA: Diagnosis not present

## 2016-05-22 DIAGNOSIS — Z5189 Encounter for other specified aftercare: Secondary | ICD-10-CM | POA: Diagnosis not present

## 2016-06-07 ENCOUNTER — Ambulatory Visit: Payer: Self-pay | Admitting: Pharmacist

## 2016-06-07 DIAGNOSIS — Z5181 Encounter for therapeutic drug level monitoring: Secondary | ICD-10-CM

## 2016-06-07 DIAGNOSIS — I480 Paroxysmal atrial fibrillation: Secondary | ICD-10-CM

## 2016-06-11 DIAGNOSIS — I509 Heart failure, unspecified: Secondary | ICD-10-CM | POA: Diagnosis not present

## 2016-06-11 DIAGNOSIS — I4892 Unspecified atrial flutter: Secondary | ICD-10-CM | POA: Diagnosis not present

## 2016-06-11 DIAGNOSIS — R600 Localized edema: Secondary | ICD-10-CM | POA: Diagnosis not present

## 2016-06-11 DIAGNOSIS — R0602 Shortness of breath: Secondary | ICD-10-CM | POA: Diagnosis not present

## 2016-06-14 DIAGNOSIS — R0602 Shortness of breath: Secondary | ICD-10-CM | POA: Diagnosis not present

## 2016-06-14 DIAGNOSIS — I509 Heart failure, unspecified: Secondary | ICD-10-CM | POA: Diagnosis not present

## 2016-06-14 DIAGNOSIS — Z79899 Other long term (current) drug therapy: Secondary | ICD-10-CM | POA: Diagnosis not present

## 2016-06-14 DIAGNOSIS — R600 Localized edema: Secondary | ICD-10-CM | POA: Diagnosis not present

## 2016-06-20 DIAGNOSIS — Z79899 Other long term (current) drug therapy: Secondary | ICD-10-CM | POA: Diagnosis not present

## 2016-06-20 DIAGNOSIS — I509 Heart failure, unspecified: Secondary | ICD-10-CM | POA: Diagnosis not present

## 2016-06-20 DIAGNOSIS — R0602 Shortness of breath: Secondary | ICD-10-CM | POA: Diagnosis not present

## 2016-06-20 DIAGNOSIS — R600 Localized edema: Secondary | ICD-10-CM | POA: Diagnosis not present

## 2016-06-26 DIAGNOSIS — M79671 Pain in right foot: Secondary | ICD-10-CM | POA: Diagnosis not present

## 2016-06-26 DIAGNOSIS — R600 Localized edema: Secondary | ICD-10-CM | POA: Diagnosis not present

## 2016-06-26 DIAGNOSIS — E876 Hypokalemia: Secondary | ICD-10-CM | POA: Diagnosis not present

## 2016-07-23 DIAGNOSIS — Z950 Presence of cardiac pacemaker: Secondary | ICD-10-CM | POA: Diagnosis not present

## 2016-07-23 DIAGNOSIS — Z8679 Personal history of other diseases of the circulatory system: Secondary | ICD-10-CM | POA: Diagnosis not present

## 2016-07-23 DIAGNOSIS — I4892 Unspecified atrial flutter: Secondary | ICD-10-CM | POA: Diagnosis not present

## 2016-07-23 DIAGNOSIS — I10 Essential (primary) hypertension: Secondary | ICD-10-CM | POA: Diagnosis not present

## 2016-07-27 DIAGNOSIS — Z45018 Encounter for adjustment and management of other part of cardiac pacemaker: Secondary | ICD-10-CM | POA: Diagnosis not present

## 2016-08-01 DIAGNOSIS — I509 Heart failure, unspecified: Secondary | ICD-10-CM | POA: Diagnosis not present

## 2016-08-01 DIAGNOSIS — Z6835 Body mass index (BMI) 35.0-35.9, adult: Secondary | ICD-10-CM | POA: Diagnosis not present

## 2016-08-01 DIAGNOSIS — Z0000 Encounter for general adult medical examination without abnormal findings: Secondary | ICD-10-CM | POA: Diagnosis not present

## 2016-08-01 DIAGNOSIS — R600 Localized edema: Secondary | ICD-10-CM | POA: Diagnosis not present

## 2016-08-01 DIAGNOSIS — I10 Essential (primary) hypertension: Secondary | ICD-10-CM | POA: Diagnosis not present

## 2016-08-01 DIAGNOSIS — Z124 Encounter for screening for malignant neoplasm of cervix: Secondary | ICD-10-CM | POA: Diagnosis not present

## 2016-08-04 DIAGNOSIS — J069 Acute upper respiratory infection, unspecified: Secondary | ICD-10-CM | POA: Diagnosis not present

## 2016-08-15 DIAGNOSIS — M2042 Other hammer toe(s) (acquired), left foot: Secondary | ICD-10-CM | POA: Diagnosis not present

## 2016-08-15 DIAGNOSIS — M2012 Hallux valgus (acquired), left foot: Secondary | ICD-10-CM | POA: Diagnosis not present

## 2016-08-15 DIAGNOSIS — M2041 Other hammer toe(s) (acquired), right foot: Secondary | ICD-10-CM | POA: Diagnosis not present

## 2016-08-15 DIAGNOSIS — M2011 Hallux valgus (acquired), right foot: Secondary | ICD-10-CM | POA: Diagnosis not present

## 2016-08-15 DIAGNOSIS — B351 Tinea unguium: Secondary | ICD-10-CM | POA: Diagnosis not present

## 2016-08-26 IMAGING — CT CT HEAD W/O CM
1 of 2 series · 16 of 30 positions shown, 20 images · non-contrast
Comparison: 08/30/2012

CLINICAL DATA: Confusion, Mild dizziness. Headache in the frontal
parietal region for 2 weeks.

EXAM:
CT HEAD WITHOUT CONTRAST
TECHNIQUE: Contiguous axial images were obtained from the base of the skull
through the vertex without intravenous contrast.

[Series 2: head w/(date) · axial · 0.47mm/px · z∈[-106,+34]mm · 16 of 32 slices shown, 20 images]
[im 2/32  brain]
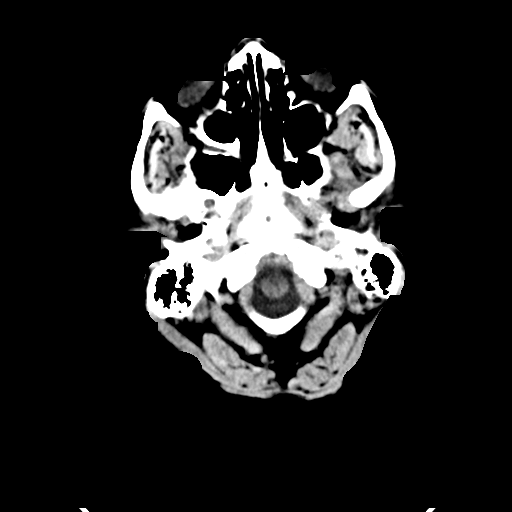
[im 2/32  bone]
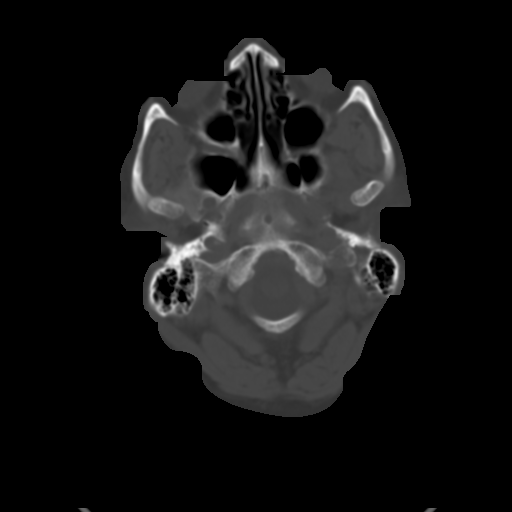
[im 3/32  brain]
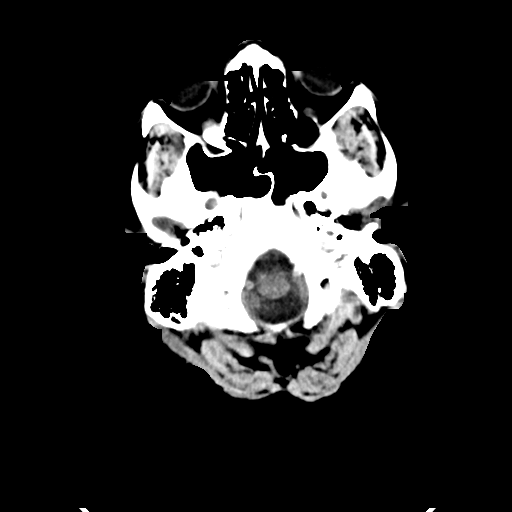
[im 6/32  brain]
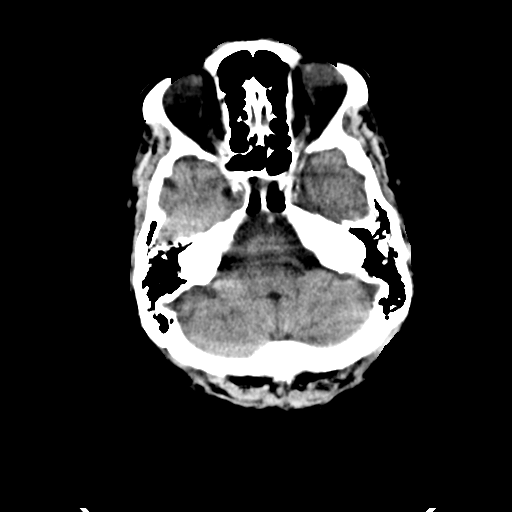
[im 8/32  brain]
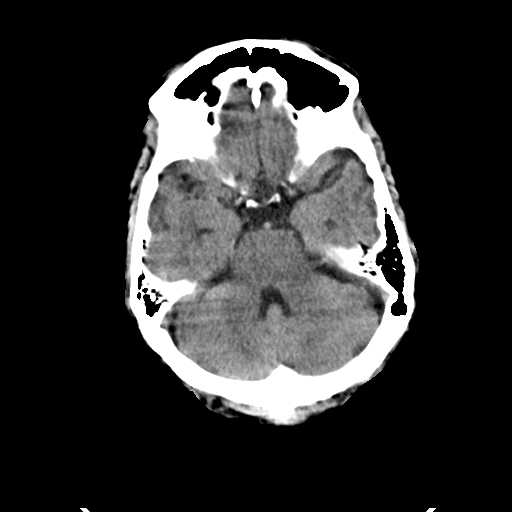
[im 9/32  brain]
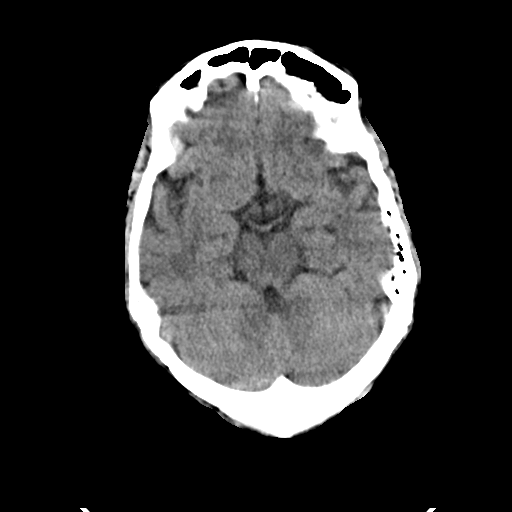
[im 9/32  bone]
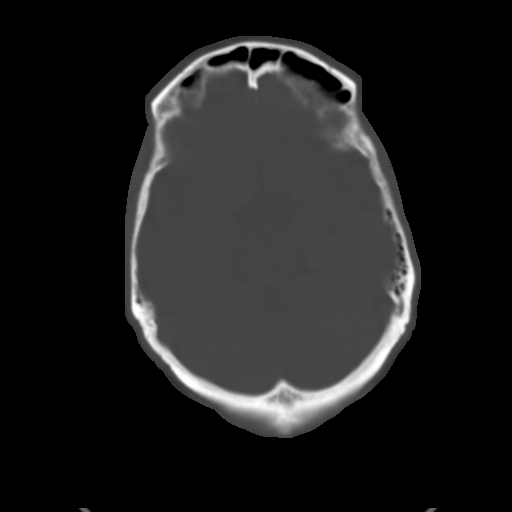
[im 12/32  brain]
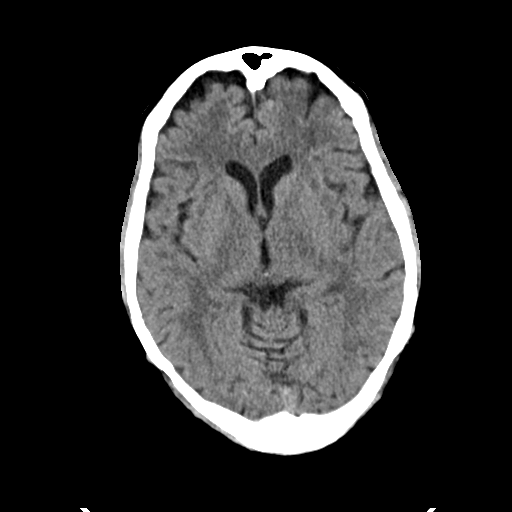
[im 13/32  brain]
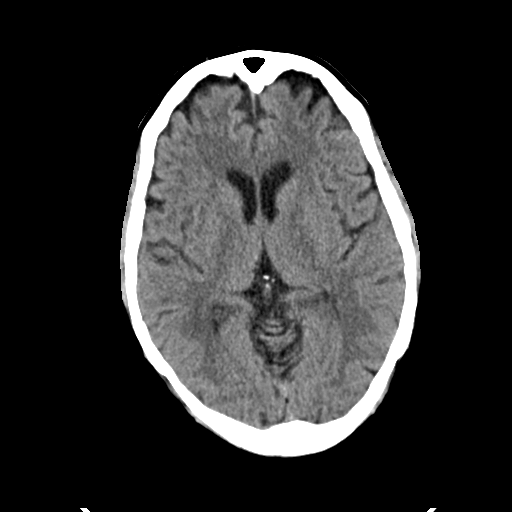
[im 15/32  brain]
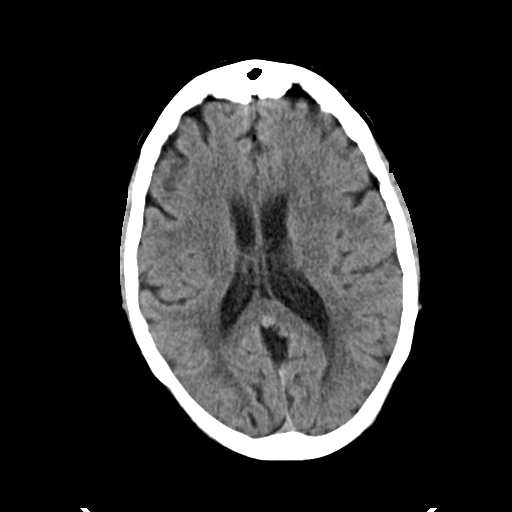
[im 17/32  brain]
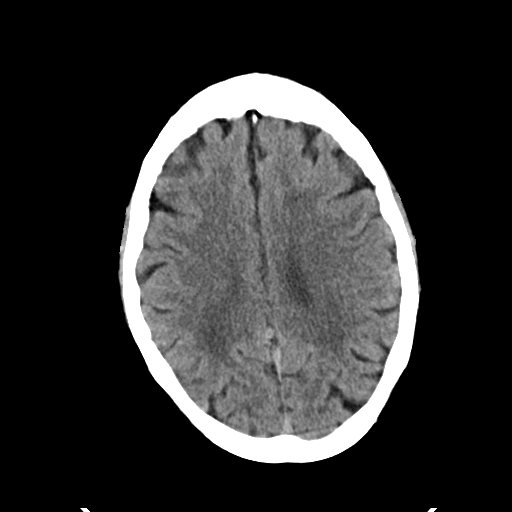
[im 17/32  bone]
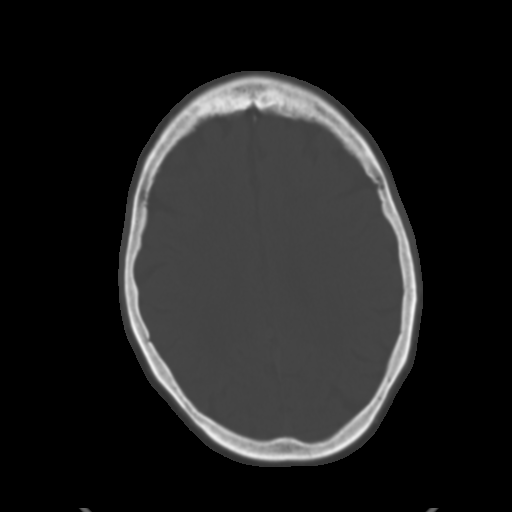
[im 19/32  brain]
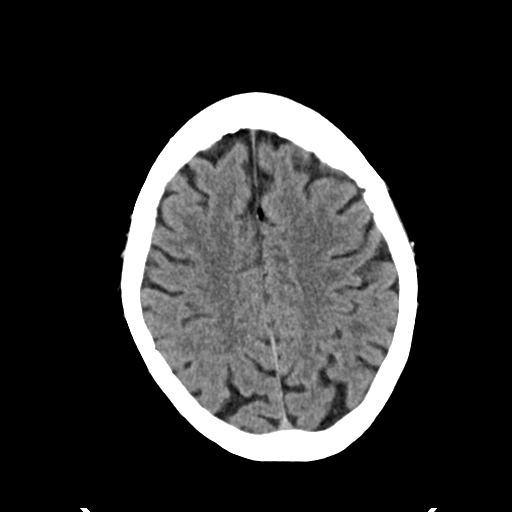
[im 20/32  brain]
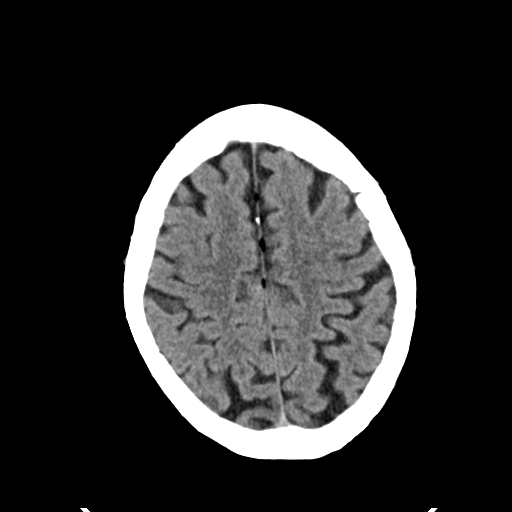
[im 23/32  brain]
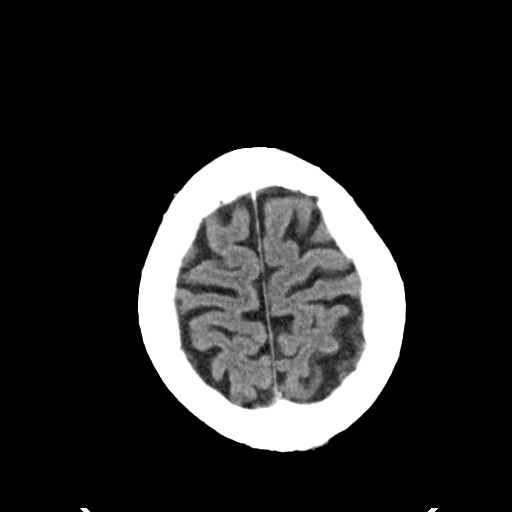
[im 24/32  brain]
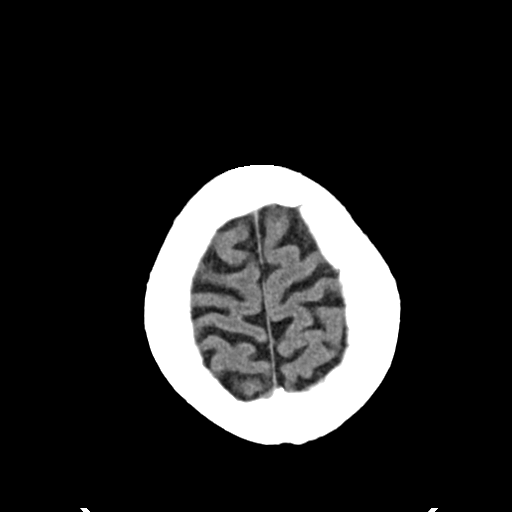
[im 24/32  bone]
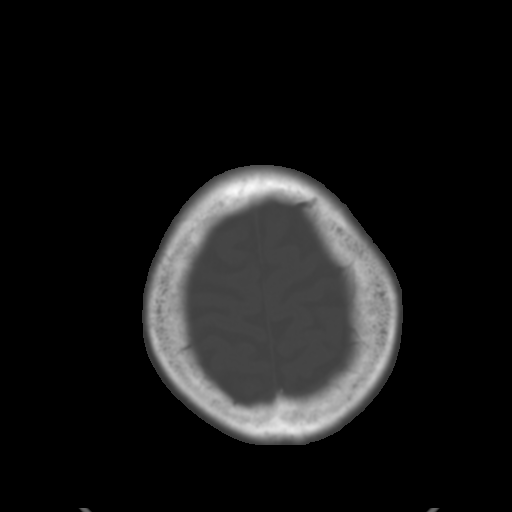
[im 26/32  brain]
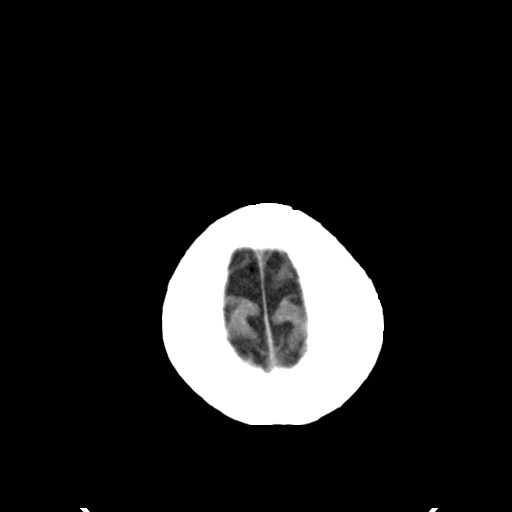
[im 29/32  brain]
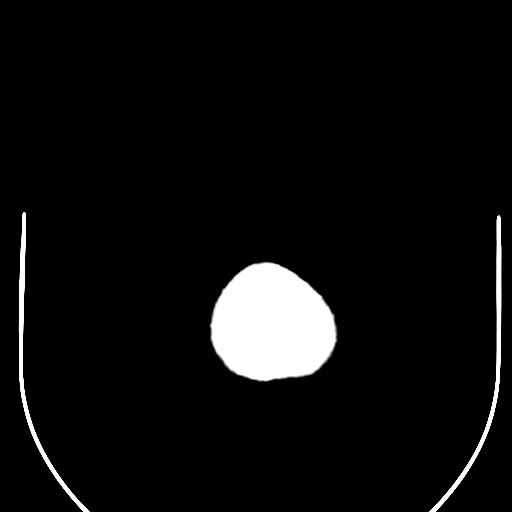
[im 30/32  brain]
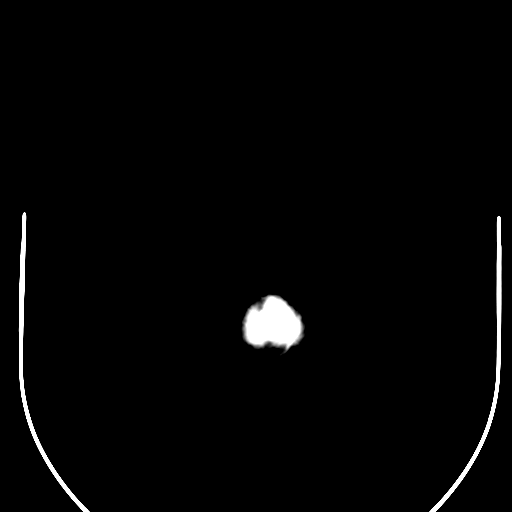

[16 of 30 positions shown; findings below may reference images not displayed]

FINDINGS: Mild generalized atrophy and chronic microvascular changes in the
deep white matter. No acute intracranial abnormality. Specifically,
no hemorrhage, hydrocephalus, mass lesion, acute infarction, or
significant intracranial injury. No acute calvarial abnormality.
Visualized paranasal sinuses and mastoids clear. Orbital soft
tissues unremarkable.
IMPRESSION: Stable exam. Mild cerebral atrophy and chronic microvascular
disease.

## 2016-09-11 DIAGNOSIS — H401132 Primary open-angle glaucoma, bilateral, moderate stage: Secondary | ICD-10-CM | POA: Diagnosis not present

## 2016-09-11 DIAGNOSIS — H52223 Regular astigmatism, bilateral: Secondary | ICD-10-CM | POA: Diagnosis not present

## 2016-09-11 DIAGNOSIS — H5203 Hypermetropia, bilateral: Secondary | ICD-10-CM | POA: Diagnosis not present

## 2016-09-11 DIAGNOSIS — H524 Presbyopia: Secondary | ICD-10-CM | POA: Diagnosis not present

## 2016-09-18 DIAGNOSIS — R0602 Shortness of breath: Secondary | ICD-10-CM | POA: Diagnosis not present

## 2016-09-18 DIAGNOSIS — I5032 Chronic diastolic (congestive) heart failure: Secondary | ICD-10-CM | POA: Diagnosis not present

## 2016-09-18 DIAGNOSIS — I4892 Unspecified atrial flutter: Secondary | ICD-10-CM | POA: Diagnosis not present

## 2016-09-18 DIAGNOSIS — Z950 Presence of cardiac pacemaker: Secondary | ICD-10-CM | POA: Diagnosis not present

## 2016-09-19 DIAGNOSIS — H6503 Acute serous otitis media, bilateral: Secondary | ICD-10-CM | POA: Diagnosis not present

## 2016-09-19 DIAGNOSIS — M542 Cervicalgia: Secondary | ICD-10-CM | POA: Diagnosis not present

## 2016-09-19 DIAGNOSIS — R51 Headache: Secondary | ICD-10-CM | POA: Diagnosis not present

## 2016-10-01 DIAGNOSIS — R0789 Other chest pain: Secondary | ICD-10-CM | POA: Diagnosis not present

## 2016-10-01 DIAGNOSIS — I4892 Unspecified atrial flutter: Secondary | ICD-10-CM | POA: Diagnosis not present

## 2016-10-01 DIAGNOSIS — I5030 Unspecified diastolic (congestive) heart failure: Secondary | ICD-10-CM | POA: Diagnosis not present

## 2016-10-01 DIAGNOSIS — R0602 Shortness of breath: Secondary | ICD-10-CM | POA: Diagnosis not present

## 2016-10-02 DIAGNOSIS — I5030 Unspecified diastolic (congestive) heart failure: Secondary | ICD-10-CM | POA: Diagnosis not present

## 2016-10-02 DIAGNOSIS — I4892 Unspecified atrial flutter: Secondary | ICD-10-CM | POA: Diagnosis not present

## 2016-10-02 DIAGNOSIS — R0789 Other chest pain: Secondary | ICD-10-CM | POA: Diagnosis not present

## 2016-10-02 DIAGNOSIS — R0602 Shortness of breath: Secondary | ICD-10-CM | POA: Diagnosis not present

## 2016-10-03 DIAGNOSIS — R42 Dizziness and giddiness: Secondary | ICD-10-CM | POA: Diagnosis not present

## 2016-10-03 DIAGNOSIS — H6503 Acute serous otitis media, bilateral: Secondary | ICD-10-CM | POA: Diagnosis not present

## 2016-10-03 DIAGNOSIS — M542 Cervicalgia: Secondary | ICD-10-CM | POA: Diagnosis not present

## 2016-10-07 DIAGNOSIS — I10 Essential (primary) hypertension: Secondary | ICD-10-CM | POA: Diagnosis not present

## 2016-10-07 DIAGNOSIS — R0602 Shortness of breath: Secondary | ICD-10-CM | POA: Diagnosis not present

## 2016-10-07 DIAGNOSIS — I4892 Unspecified atrial flutter: Secondary | ICD-10-CM | POA: Diagnosis not present

## 2016-10-07 DIAGNOSIS — I509 Heart failure, unspecified: Secondary | ICD-10-CM | POA: Diagnosis not present

## 2016-10-08 DIAGNOSIS — Z950 Presence of cardiac pacemaker: Secondary | ICD-10-CM | POA: Diagnosis not present

## 2016-10-16 DIAGNOSIS — H401132 Primary open-angle glaucoma, bilateral, moderate stage: Secondary | ICD-10-CM | POA: Diagnosis not present

## 2016-10-22 DIAGNOSIS — R0602 Shortness of breath: Secondary | ICD-10-CM | POA: Diagnosis not present

## 2016-10-25 DIAGNOSIS — M542 Cervicalgia: Secondary | ICD-10-CM | POA: Diagnosis not present

## 2016-10-25 DIAGNOSIS — M79641 Pain in right hand: Secondary | ICD-10-CM | POA: Diagnosis not present

## 2016-10-25 DIAGNOSIS — M545 Low back pain: Secondary | ICD-10-CM | POA: Diagnosis not present

## 2016-11-26 DIAGNOSIS — Z23 Encounter for immunization: Secondary | ICD-10-CM | POA: Diagnosis not present

## 2016-12-10 ENCOUNTER — Telehealth: Payer: Self-pay | Admitting: Internal Medicine

## 2016-12-10 NOTE — Telephone Encounter (Signed)
New message   Pt verbalized that she no longer lives in [place] and that she is with a cardi division in her local area

## 2016-12-10 NOTE — Telephone Encounter (Signed)
Patient needs to be released in [place].  Attempted to call patient x 1 -N/A.

## 2016-12-10 NOTE — Telephone Encounter (Signed)
Notified scheduler.  Will forward to device clinic.

## 2016-12-11 NOTE — Telephone Encounter (Signed)
Spoke with pt, pt stated that she is being followed by [facility] in [place], pt's carelink request sent to be followed by that clinic.

## 2016-12-26 DIAGNOSIS — J069 Acute upper respiratory infection, unspecified: Secondary | ICD-10-CM | POA: Diagnosis not present

## 2018-01-15 DIAGNOSIS — I509 Heart failure, unspecified: Secondary | ICD-10-CM | POA: Insufficient documentation

## 2020-03-06 DIAGNOSIS — I272 Pulmonary hypertension, unspecified: Secondary | ICD-10-CM | POA: Insufficient documentation

## 2020-03-06 DIAGNOSIS — I351 Nonrheumatic aortic (valve) insufficiency: Secondary | ICD-10-CM | POA: Insufficient documentation

## 2020-03-06 DIAGNOSIS — R7303 Prediabetes: Secondary | ICD-10-CM | POA: Insufficient documentation

## 2020-03-06 DIAGNOSIS — G3184 Mild cognitive impairment, so stated: Secondary | ICD-10-CM | POA: Insufficient documentation

## 2020-03-06 DIAGNOSIS — N1831 Chronic kidney disease, stage 3a: Secondary | ICD-10-CM | POA: Insufficient documentation

## 2020-03-06 DIAGNOSIS — R3129 Other microscopic hematuria: Secondary | ICD-10-CM | POA: Insufficient documentation

## 2020-03-08 DIAGNOSIS — H409 Unspecified glaucoma: Secondary | ICD-10-CM | POA: Insufficient documentation

## 2020-03-09 DIAGNOSIS — F419 Anxiety disorder, unspecified: Secondary | ICD-10-CM | POA: Insufficient documentation

## 2020-04-12 DIAGNOSIS — H9190 Unspecified hearing loss, unspecified ear: Secondary | ICD-10-CM | POA: Insufficient documentation

## 2020-05-23 DIAGNOSIS — N281 Cyst of kidney, acquired: Secondary | ICD-10-CM | POA: Insufficient documentation

## 2020-05-23 DIAGNOSIS — M85852 Other specified disorders of bone density and structure, left thigh: Secondary | ICD-10-CM | POA: Insufficient documentation

## 2020-06-16 ENCOUNTER — Other Ambulatory Visit: Payer: Self-pay

## 2020-06-16 ENCOUNTER — Ambulatory Visit
Admission: EM | Admit: 2020-06-16 | Discharge: 2020-06-16 | Disposition: A | Discharge status: Home / Self Care | Payer: Medicare Other | Attending: Family Medicine | Admitting: Family Medicine

## 2020-06-16 DIAGNOSIS — M2550 Pain in unspecified joint: Secondary | ICD-10-CM

## 2020-06-16 MED ORDER — PREDNISONE 10 MG PO TABS: ORAL_TABLET | ORAL | 0 refills | Status: DC

## 2020-06-16 NOTE — Discharge Instructions (Signed)
Rest, ice.  Medication as prescribed.  Take care  Dr. Lacinda Axon

## 2020-06-16 NOTE — ED Triage Notes (Signed)
Pt c/o left elbow pain and swelling, also has swelling to her left wrist, that comes and goes for several years. She states she bumped it on her car about 2 years ago, she has had pain in the elbow since then. Pt states the wrist swelling also comes and goes, often associated with the elbow pain.

## 2020-06-16 NOTE — ED Provider Notes (Signed)
[facility]    CSN: [id number] Arrival date & time: 06/16/20  1241  History   Chief Complaint Chief Complaint  Patient presents with  . Elbow Pain    left   HPI  87 year old female presents with joint pain.  Patient reports he has had chronic intermittent joint pain for years.  EMR reflects she has a history of gout as well as osteoarthritis.  Patient reports that over the past 24 hours she has developed pain and swelling of the left elbow as well as the left wrist.  She also endorses pain to left shoulder.  Pain is 10/10 in severity.  No relieving factors.  No fall, trauma, injury.  No bruising.  No other complaints.  Past Medical History:  Diagnosis Date  . Anxiety    takes Valium daily as needed  . Arthritis    "joints"  . Chronic back pain    stenosis  . Chronic bronchitis ([place])    "get it practically q yr"   . Chronic diastolic CHF (congestive heart failure) (HCC)    takes Bumex daily  . Coronary atherosclerosis of native coronary artery   . Depression   . Diverticulosis   . Dizziness    occasionally  . GERD (gastroesophageal reflux disease)   . Glaucoma    uses eye drops daily  . Heart murmur   . History of colon polyps   . History of gastric ulcer   . History of gout    doesn't take any meds  . Hypertension    takes Amlodipine and Diovan daily  . Joint pain   . Joint swelling   . Lichen planus   . Muscle spasm    takes Celebrex daily and Robaxin daily as needed  . Osteoarthritis   . Pacemaker   . PAF (paroxysmal atrial fibrillation) (HCC)    takes Coumadin daily  . Pneumonia    as a child  . Stroke [facility]) 1990's   "found it in ~ 1997-1998; never even knew I'd had one"  . Tachy-brady syndrome ([place])    With permanent pacemaker 04/03/99  . TIA (transient ischemic attack)   . Tuberculosis    during childhood unsure of time  . Type II diabetes mellitus ([place])    but doesn't take any meds (10/19/2013)  . Venous insufficiency     Patient  Active Problem List   Diagnosis Date Noted  . TIA (transient ischemic attack) 02/08/2014  . Right knee DJD 10/19/2013  . Morbid obesity ([place]) 10/19/2013  . Encounter for therapeutic drug monitoring 02/23/2013  . Pacemaker 02/23/2013  . Chronic diastolic heart failure ([place]) 11/03/2012    Class: Chronic  . Long term current use of anticoagulant therapy 11/03/2012    Class: Chronic  . Depression   . Stroke ([place])   . Paroxysmal A-fib ([place]) 08/30/2012  . HTN (hypertension) 08/30/2012  . Gout 08/30/2012  . Abdominal pain, generalized 04/11/2011  . Special screening for malignant neoplasms, colon 04/11/2011    Past Surgical History:  Procedure Laterality Date  . ABDOMINAL HYSTERECTOMY     w/BSO  . BACK SURGERY    . CARDIAC CATHETERIZATION  X 2  . CATARACT EXTRACTION W/ INTRAOCULAR LENS  IMPLANT, BILATERAL Bilateral ?2005  . COLONOSCOPY    . ESOPHAGOGASTRODUODENOSCOPY    . INSERT / REPLACE / REMOVE PACEMAKER  04/03/99; ?2008  . [place] SURGERY  08/2006  . TONSILLECTOMY    . TOTAL KNEE ARTHROPLASTY Right 10/19/2013  . TOTAL KNEE ARTHROPLASTY  Right 10/19/2013   Procedure: TOTAL KNEE ARTHROPLASTY;  Surgeon: Hessie Dibble, MD;  Location: [facility];  Service: Orthopedics;  Laterality: Right;  . TUBAL LIGATION  1971    OB History   No obstetric history on file.      Home Medications    Prior to Admission medications   Medication Sig Start Date End Date Taking? Authorizing Provider  amLODipine (NORVASC) 2.5 MG tablet Take 2.5 mg by mouth daily. 04/26/20  Yes [provider]  b complex vitamins tablet Take 1 tablet by mouth daily.   Yes [provider]  beta carotene 25000 UNIT capsule Take 25,000 Units by mouth 3 (three) times a week.   Yes [provider]  Cholecalciferol 25 MCG (1000 UT) tablet Take by mouth.   Yes [provider]  Elderberry 575 MG/5ML SYRP Take 575 mg by mouth daily as needed (cold symptoms).    Yes [provider]   ELIQUIS 5 MG TABS tablet Take 1 tablet by mouth 2 (two) times daily. 05/04/20  Yes [provider]  empagliflozin (JARDIANCE) 10 MG TABS tablet Take 1 tablet by mouth daily. 04/05/20  Yes [provider]  latanoprost (XALATAN) 0.005 % ophthalmic solution 1 drop at bedtime. 02/11/20  Yes [provider]  losartan (COZAAR) 50 MG tablet Take 1 tablet by mouth daily. 06/07/20  Yes [provider]  metoprolol succinate (TOPROL-XL) 100 MG 24 hr tablet Take 100 mg by mouth daily. 04/19/20  Yes [provider]  Multiple Vitamin (MULTIVITAMIN WITH MINERALS) TABS Take 1 tablet by mouth daily.   Yes [provider]  predniSONE (DELTASONE) 10 MG tablet 20 mg daily for 5 days, then 10 mg daily for 5 days. 06/16/20  Yes Silveria Botz G, DO  rosuvastatin (CRESTOR) 10 MG tablet Take 10 mg by mouth at bedtime. 04/14/20  Yes [provider]  spironolactone (ALDACTONE) 25 MG tablet Take 1 tablet by mouth daily. 06/07/20  Yes [provider]  vitamin C (ASCORBIC ACID) 500 MG tablet Take 500 mg by mouth daily as needed (COLD SYMPTOMS).    Yes [provider]  Acetaminophen (TYLENOL ARTHRITIS PAIN PO) Take 1 tablet by mouth every 6 (six) hours as needed (arthritis).    [provider]  bimatoprost (LUMIGAN) 0.01 % SOLN Place 1 drop into both eyes at bedtime.    [provider]  diazepam (VALIUM) 5 MG tablet Take 5 mg by mouth daily as needed for anxiety.     [provider]  Glycerin-Polysorbate 80 (REFRESH DRY EYE THERAPY OP) Apply 1 drop to eye daily as needed (dry eyes).    [provider]  nystatin (MYCOSTATIN) 100000 UNIT/ML suspension Take 500,000 Units by mouth daily as needed (for mouth irritation).     [provider]  OVER THE COUNTER MEDICATION Apply 1 application topically 2 (two) times daily as needed (TOENAIL FUNGUS). Med Name: "Formula 3"    [provider]  senna (SENOKOT) 8.6 MG  tablet Take 2 tablets by mouth at bedtime.     [provider]    Family History Family History  Problem Relation Age of Onset  . Hypertension Mother   . Cancer Mother        Maglignant Brain Tumor  . Heart disease Father   . Hypertension Father   . Heart failure Sister   . Diabetes Sister   . Malignant hyperthermia Neg Hx     Social History Social History   Tobacco  Use  . Smoking status: Never Smoker  . Smokeless tobacco: Never Used  Vaping Use  . Vaping Use: Never used  Substance Use Topics  . Alcohol use: No  . Drug use: No     Allergies   Procardia [nifedipine], Lasix [furosemide], Levsin [hyoscyamine sulfate], and Prednisone   Review of Systems Review of Systems  Constitutional: Negative.   Musculoskeletal: Positive for arthralgias.   Physical Exam Triage Vital Signs ED Triage Vitals  Enc Vitals Group     BP 06/16/20 1255 136/73     Pulse Rate 06/16/20 1255 75     Resp 06/16/20 1255 18     Temp 06/16/20 1255 98.9 F (37.2 C)     Temp Source 06/16/20 1255 Oral     SpO2 06/16/20 1255 100 %     Weight 06/16/20 1251 202 lb (91.6 kg)     Height 06/16/20 1251 5\' 2"  (1.575 m)     Head Circumference --      Peak Flow --      Pain Score 06/16/20 1251 10     Pain Loc --      Pain Edu? --      Excl. in [place]? --    Updated Vital Signs BP 136/73 (BP Location: Left Arm)   Pulse 75   Temp 98.9 F (37.2 C) (Oral)   Resp 18   Ht 5\' 2"  (1.575 m)   Wt 91.6 kg   SpO2 100%   BMI 36.95 kg/m   Visual Acuity Right Eye Distance:   Left Eye Distance:   Bilateral Distance:    Right Eye Near:   Left Eye Near:    Bilateral Near:     Physical Exam Vitals and nursing note reviewed.  Constitutional:      General: She is not in acute distress.    Appearance: Normal appearance. She is obese. She is not ill-appearing.  HENT:     Head: Normocephalic and atraumatic.  Pulmonary:     Effort: Pulmonary effort is normal. No respiratory distress.   Musculoskeletal:     Comments: Left wrist -swelling and diffuse tenderness.  Left elbow -tenderness over the olecranon.  Neurological:     Mental Status: She is alert.  Psychiatric:        Mood and Affect: Mood normal.        Behavior: Behavior normal.    UC Treatments / Results  Labs (all labs ordered are listed, but only abnormal results are displayed) Labs Reviewed - No data to display  EKG   Radiology No results found.  Procedures Procedures (including critical care time)  Medications Ordered in UC Medications - No data to display  Initial Impression / Assessment and Plan / UC Course  I have reviewed the triage vital signs and the nursing notes.  Pertinent labs & imaging results that were available during my care of the patient were reviewed by me and considered in my medical decision making (see chart for details).    87 year old female presents with left wrist pain, left elbow pain.  Also reports left shoulder pain.  Has a history of osteoarthritis as well as gout.  Placing on prednisone.  Final Clinical Impressions(s) / UC Diagnoses   Final diagnoses:  Arthralgia, unspecified joint     Discharge Instructions     Rest, ice.  Medication as prescribed.  Take care  Dr. Lacinda Axon    ED Prescriptions    Medication Sig Dispense Auth. Provider   predniSONE (DELTASONE) 10  MG tablet 20 mg daily for 5 days, then 10 mg daily for 5 days. 15 tablet Thersa Salt G, DO     I have reviewed the PDMP during this encounter.   Coral Spikes, [place] 06/16/20 [phone]

## 2020-06-22 DIAGNOSIS — M109 Gout, unspecified: Secondary | ICD-10-CM | POA: Insufficient documentation

## 2020-06-23 DIAGNOSIS — G4733 Obstructive sleep apnea (adult) (pediatric): Secondary | ICD-10-CM | POA: Insufficient documentation

## 2020-06-23 DIAGNOSIS — L609 Nail disorder, unspecified: Secondary | ICD-10-CM | POA: Insufficient documentation

## 2020-07-13 ENCOUNTER — Other Ambulatory Visit: Payer: Self-pay

## 2020-07-13 ENCOUNTER — Encounter: Payer: Self-pay | Admitting: Podiatry

## 2020-07-13 ENCOUNTER — Ambulatory Visit (INDEPENDENT_AMBULATORY_CARE_PROVIDER_SITE_OTHER): Payer: Medicare Other | Admitting: Podiatry

## 2020-07-13 DIAGNOSIS — M2010 Hallux valgus (acquired), unspecified foot: Secondary | ICD-10-CM

## 2020-07-13 DIAGNOSIS — L608 Other nail disorders: Secondary | ICD-10-CM | POA: Diagnosis not present

## 2020-07-13 DIAGNOSIS — M79675 Pain in left toe(s): Secondary | ICD-10-CM

## 2020-07-13 DIAGNOSIS — B351 Tinea unguium: Secondary | ICD-10-CM

## 2020-07-13 DIAGNOSIS — D689 Coagulation defect, unspecified: Secondary | ICD-10-CM

## 2020-07-13 DIAGNOSIS — M79674 Pain in right toe(s): Secondary | ICD-10-CM | POA: Diagnosis not present

## 2020-07-13 NOTE — Progress Notes (Signed)
This patient returns to my office for at risk foot care.  This patient requires this care by a professional since this patient will be at risk due to having diet controlled diabetes. This patient also says she is experiencing tingling from the top of both feet into her big toes. This patient is unable to cut nails herself since the patient cannot reach his nails.These nails are painful walking and wearing shoes.  This patient presents for at risk foot care today.  General Appearance  Alert, conversant and in no acute stress.  Vascular  Dorsalis pedis  pulses are palpable  bilaterally. Posterior tibial pulses are weakly palpable  B/L. Capillary return is within normal limits  bilaterally. Temperature is within normal limits  bilaterally.  Neurologic  Senn-Weinstein monofilament wire test within normal limits  bilaterally. Muscle power within normal limits bilaterally.  Nails Thick disfigured discolored nails with subungual debris  from hallux to fifth toes bilaterally. No evidence of bacterial infection or drainage bilaterally.  Orthopedic  No limitations of motion  feet .  No crepitus or effusions noted.  No bony pathology or digital deformities noted.  HAV  B/L.  Swelling 2+  B/L.  Skin  normotropic skin with no porokeratosis noted bilaterally.  No signs of infections or ulcers noted.     Onychomycosis  Pain in right toes  Pain in left toes  Consent was obtained for treatment procedures.   Mechanical debridement of nails 1-5  bilaterally performed with a nail nipper.  Filed with dremel without incident.    Return office visit    3 months                  Told patient to return for periodic foot care and evaluation due to potential at risk complications.   Gardiner Barefoot DPM

## 2020-10-19 ENCOUNTER — Ambulatory Visit (INDEPENDENT_AMBULATORY_CARE_PROVIDER_SITE_OTHER): Payer: Medicare Other | Admitting: Podiatry

## 2020-10-19 ENCOUNTER — Other Ambulatory Visit: Payer: Self-pay

## 2020-10-19 ENCOUNTER — Encounter: Payer: Self-pay | Admitting: Podiatry

## 2020-10-19 DIAGNOSIS — L608 Other nail disorders: Secondary | ICD-10-CM

## 2020-10-19 DIAGNOSIS — M79675 Pain in left toe(s): Secondary | ICD-10-CM

## 2020-10-19 DIAGNOSIS — M79674 Pain in right toe(s): Secondary | ICD-10-CM

## 2020-10-19 DIAGNOSIS — R350 Frequency of micturition: Secondary | ICD-10-CM | POA: Insufficient documentation

## 2020-10-19 DIAGNOSIS — M2010 Hallux valgus (acquired), unspecified foot: Secondary | ICD-10-CM

## 2020-10-19 DIAGNOSIS — D689 Coagulation defect, unspecified: Secondary | ICD-10-CM | POA: Diagnosis not present

## 2020-10-19 DIAGNOSIS — B351 Tinea unguium: Secondary | ICD-10-CM | POA: Diagnosis not present

## 2020-10-19 NOTE — Progress Notes (Signed)
This patient returns to my office for at risk foot care.  This patient requires this care by a professional since this patient will be at risk due to having diet controlled diabetes. This patient also says she is experiencing tingling from the top of both feet into her big toes. This patient is unable to cut nails herself since the patient cannot reach his nails.These nails are painful walking and wearing shoes.  This patient presents for at risk foot care today.  General Appearance  Alert, conversant and in no acute stress.  Vascular  Dorsalis pedis  pulses are palpable  bilaterally. Posterior tibial pulses are weakly palpable  B/L. Capillary return is within normal limits  bilaterally. Temperature is within normal limits  bilaterally.  Neurologic  Senn-Weinstein monofilament wire test within normal limits  bilaterally. Muscle power within normal limits bilaterally.  Nails Thick disfigured discolored nails with subungual debris  from hallux to fifth toes bilaterally. No evidence of bacterial infection or drainage bilaterally.  Orthopedic  No limitations of motion  feet .  No crepitus or effusions noted.  No bony pathology or digital deformities noted.  HAV  B/L.  Swelling 2+  B/L.  Skin  normotropic skin with no porokeratosis noted bilaterally.  No signs of infections or ulcers noted.     Onychomycosis  Pain in right toes  Pain in left toes  Consent was obtained for treatment procedures.   Mechanical debridement of nails 1-5  bilaterally performed with a nail nipper.  Filed with dremel without incident.    Return office visit    3 months                  Told patient to return for periodic foot care and evaluation due to potential at risk complications.   Gardiner Barefoot DPM

## 2021-01-15 ENCOUNTER — Ambulatory Visit: Payer: Medicare Other | Admitting: Podiatry

## 2022-08-04 ENCOUNTER — Emergency Department: Payer: Medicare Other

## 2022-08-04 ENCOUNTER — Emergency Department
Admission: EM | Admit: 2022-08-04 | Discharge: 2022-08-04 | Disposition: A | Discharge status: Home / Self Care | Payer: Medicare Other | Source: Home / Self Care | Attending: Emergency Medicine | Admitting: Emergency Medicine

## 2022-08-04 ENCOUNTER — Other Ambulatory Visit: Payer: Self-pay

## 2022-08-04 DIAGNOSIS — Z950 Presence of cardiac pacemaker: Secondary | ICD-10-CM | POA: Insufficient documentation

## 2022-08-04 DIAGNOSIS — I129 Hypertensive chronic kidney disease with stage 1 through stage 4 chronic kidney disease, or unspecified chronic kidney disease: Secondary | ICD-10-CM | POA: Diagnosis not present

## 2022-08-04 DIAGNOSIS — I493 Ventricular premature depolarization: Secondary | ICD-10-CM | POA: Insufficient documentation

## 2022-08-04 DIAGNOSIS — I272 Pulmonary hypertension, unspecified: Secondary | ICD-10-CM | POA: Diagnosis not present

## 2022-08-04 DIAGNOSIS — R079 Chest pain, unspecified: Secondary | ICD-10-CM | POA: Insufficient documentation

## 2022-08-04 DIAGNOSIS — N189 Chronic kidney disease, unspecified: Secondary | ICD-10-CM | POA: Diagnosis not present

## 2022-08-04 DIAGNOSIS — I4891 Unspecified atrial fibrillation: Secondary | ICD-10-CM | POA: Insufficient documentation

## 2022-08-04 DIAGNOSIS — Z7901 Long term (current) use of anticoagulants: Secondary | ICD-10-CM | POA: Diagnosis not present

## 2022-08-04 DIAGNOSIS — I2510 Atherosclerotic heart disease of native coronary artery without angina pectoris: Secondary | ICD-10-CM | POA: Insufficient documentation

## 2022-08-04 DIAGNOSIS — I10 Essential (primary) hypertension: Secondary | ICD-10-CM | POA: Insufficient documentation

## 2022-08-04 LAB — CBC
HCT: 44.6 % (ref 36.0–46.0)
Hemoglobin: 14.1 g/dL (ref 12.0–15.0)
MCH: 28.6 pg (ref 26.0–34.0)
MCHC: 31.6 g/dL (ref 30.0–36.0)
MCV: 90.5 fL (ref 80.0–100.0)
Platelets: 179 10*3/uL (ref 150–400)
RBC: 4.93 MIL/uL (ref 3.87–5.11)
RDW: 13.5 % (ref 11.5–15.5)
WBC: 6.8 10*3/uL (ref 4.0–10.5)
nRBC: 0 % (ref 0.0–0.2)

## 2022-08-04 LAB — COMPREHENSIVE METABOLIC PANEL
ALT: 20 U/L (ref 0–44)
AST: 19 U/L (ref 15–41)
Albumin: 4.2 g/dL (ref 3.5–5.0)
Alkaline Phosphatase: 53 U/L (ref 38–126)
Anion gap: 10 (ref 5–15)
BUN: 30 mg/dL — ABNORMAL HIGH (ref 8–23)
CO2: 28 mmol/L (ref 22–32)
Calcium: 9.2 mg/dL (ref 8.9–10.3)
Chloride: 96 mmol/L — ABNORMAL LOW (ref 98–111)
Creatinine, Ser: 1.68 mg/dL — ABNORMAL HIGH (ref 0.44–1.00)
GFR, Estimated: 29 mL/min — ABNORMAL LOW (ref >60)
Glucose, Bld: 94 mg/dL (ref 70–99)
Potassium: 3.9 mmol/L (ref 3.5–5.1)
Sodium: 134 mmol/L — ABNORMAL LOW (ref 135–145)
Total Bilirubin: 1.2 mg/dL (ref 0.3–1.2)
Total Protein: 7.4 g/dL (ref 6.5–8.1)

## 2022-08-04 LAB — D-DIMER, QUANTITATIVE: D-Dimer, Quant: 0.3 ug/mL-FEU (ref 0.00–0.50)

## 2022-08-04 LAB — MAGNESIUM: Magnesium: 2.6 mg/dL — ABNORMAL HIGH (ref 1.7–2.4)

## 2022-08-04 LAB — TROPONIN I (HIGH SENSITIVITY)
Troponin I (High Sensitivity): 11 ng/L (ref <18)
Troponin I (High Sensitivity): 11 ng/L (ref <18)

## 2022-08-04 LAB — BRAIN NATRIURETIC PEPTIDE: B Natriuretic Peptide: 72.7 pg/mL (ref 0.0–100.0)

## 2022-08-04 NOTE — ED Triage Notes (Signed)
PT BIB EMS for left sided chest pain radiating to left shoulder which started around 7pm, while she was in bed she started having palpitations which prompted her to call EMS. Pt has hx of hypertension and afib. NSR upon arrival

## 2022-08-04 NOTE — Discharge Instructions (Addendum)
Your chest pain work-up today was very reassuring  Follow-up with your primary doctor in 2-3 days  Take your medications as prescribed

## 2022-08-04 NOTE — ED Notes (Signed)
Pacemaker interrogated at this time.

## 2022-08-04 NOTE — ED Provider Notes (Signed)
[facility] Provider Note    Event Date/Time   First MD Initiated Contact with Patient 08/04/22 [phone]     (approximate)   History   Chest Pain   HPI  [name] is a 89 y.o. female past medical history significant for Medtronic pacemaker, CAD, hypertension, pulmonary hypertension, atrial fibrillation on Eliquis, who presents to the emergency department with chest pain.  States that she started having chest pain last night that progressively worsened throughout the night.  Complaining of sharp stabbing pain to the left side of her chest.  Nothing improves or worsens the pain.  States that it has been intermittent and now is radiating around to her back.  Denies any active pain at this time.  Denies any nausea, vomiting or diaphoresis.  No shortness of breath.  Endorses compliance with her home medications including Eliquis.  No history of DVT or PE.  Denies any leg swelling.     Physical Exam   Triage Vital Signs: ED Triage Vitals  Enc Vitals Group     BP      Pulse      Resp      Temp      Temp src      SpO2      Weight      Height      Head Circumference      Peak Flow      Pain Score      Pain Loc      Pain Edu?      Excl. in [place]?     Most recent vital signs: Vitals:   08/04/22 0534  BP: (!) 186/90  Pulse: 68  Resp: 18  Temp: 98.1 F (36.7 C)  SpO2: 100%    Physical Exam Constitutional:      Appearance: She is well-developed.  HENT:     Head: Atraumatic.  Eyes:     Conjunctiva/sclera: Conjunctivae normal.  Cardiovascular:     Rate and Rhythm: Regular rhythm.  Pulmonary:     Effort: No respiratory distress.  Abdominal:     General: There is no distension.     Palpations: Abdomen is soft.     Tenderness: There is no abdominal tenderness.     Comments: Negative Murphy sign  Musculoskeletal:        General: Normal range of motion.     Cervical back: Normal range of motion.     Right lower leg: No tenderness. No edema.      Left lower leg: No tenderness. No edema.  Skin:    General: Skin is warm.     Capillary Refill: Capillary refill takes less than 2 seconds.  Neurological:     General: No focal deficit present.     Mental Status: She is alert. Mental status is at baseline.     IMPRESSION / MDM / ASSESSMENT AND PLAN / ED COURSE  I reviewed the triage vital signs and the nursing notes.  Differential diagnosis including ACS, anemia, pneumonia, musculoskeletal, pulmonary embolism, gastritis/GERD.  Low suspicion for dissection, no tearing chest pain, no active chest pain at this time.  On chart review patient has a Medtronic pacemaker. EKG  I, Corena Herter, the attending physician, personally viewed and interpreted this ECG.  Atrial fibrillation with 1 PVC.  Normal rate.  QTc 430.  No significant ST elevation or depression and no signs of acute ischemia or dysrhythmia.  No tachycardic or bradycardic dysrhythmias while on cardiac telemetry.  RADIOLOGY I independently reviewed imaging, my interpretation of imaging: Chest x-ray with no findings of pneumonia.  Read as cardiomegaly with mild pulmonary congestion  LABS (all labs ordered are listed, but only abnormal results are displayed) Labs interpreted as -    Labs Reviewed  COMPREHENSIVE METABOLIC PANEL - Abnormal; Notable for the following components:      Result Value   Sodium 134 (*)    Chloride 96 (*)    BUN 30 (*)    Creatinine, Ser 1.68 (*)    GFR, Estimated 29 (*)    All other components within normal limits  MAGNESIUM - Abnormal; Notable for the following components:   Magnesium 2.6 (*)    All other components within normal limits  CBC  BRAIN NATRIURETIC PEPTIDE  D-DIMER, QUANTITATIVE  TROPONIN I (HIGH SENSITIVITY)  TROPONIN I (HIGH SENSITIVITY)     MDM    Low risk Wells criteria, D-dimer is negative have a low suspicion for pulmonary embolism.  No significant electrolyte abnormalities, has an elevated magnesium level.   Creatinine is at her baseline when compared to outside lab work, consistent with her known CKD.  Initial troponin is negative.  Medtronic pacemaker was interrogated and currently waiting for the read.  Low suspicion for acute cholecystitis, no abdominal pain or tenderness.  No tenderness with range of motion to the left shoulder.  Low suspicion for dissection, no tearing chest pain, equal pulses and no active pain at this time.  Plan for serial troponin.  Care transferred to incoming physician.   PROCEDURES:  Critical Care performed: No  Procedures  Patient's presentation is most consistent with acute presentation with potential threat to life or bodily function.   MEDICATIONS ORDERED IN ED: Medications - No data to display  FINAL CLINICAL IMPRESSION(S) / ED DIAGNOSES   Final diagnoses:  Chest pain, unspecified type     Rx / DC Orders   ED Discharge Orders     None        Note:  This document was prepared using Dragon voice recognition software and may include unintentional dictation errors.   Corena Herter, MD 08/04/22 [phone]

## 2022-08-22 NOTE — Progress Notes (Signed)
Evaluate for heart failure
